# Patient Record
Sex: Female | Born: 1945
Health system: Southern US, Community
[De-identification: ages and names within clinical notes are randomized; demographics above are authoritative.]

## PROBLEM LIST (undated history)

## (undated) DIAGNOSIS — F341 Dysthymic disorder: Secondary | ICD-10-CM

## (undated) DIAGNOSIS — Z8673 Personal history of transient ischemic attack (TIA), and cerebral infarction without residual deficits: Secondary | ICD-10-CM

## (undated) DIAGNOSIS — D0511 Intraductal carcinoma in situ of right breast: Secondary | ICD-10-CM

## (undated) DIAGNOSIS — G629 Polyneuropathy, unspecified: Secondary | ICD-10-CM

## (undated) DIAGNOSIS — Z8489 Family history of other specified conditions: Secondary | ICD-10-CM

## (undated) DIAGNOSIS — E785 Hyperlipidemia, unspecified: Secondary | ICD-10-CM

## (undated) DIAGNOSIS — C50912 Malignant neoplasm of unspecified site of left female breast: Secondary | ICD-10-CM

## (undated) DIAGNOSIS — J449 Chronic obstructive pulmonary disease, unspecified: Secondary | ICD-10-CM

## (undated) HISTORY — DX: Dysthymic disorder: F34.1

## (undated) HISTORY — DX: Polyneuropathy, unspecified: G62.9

## (undated) HISTORY — DX: Morbid (severe) obesity due to excess calories: E66.01

## (undated) HISTORY — PX: VAGINAL HYSTERECTOMY: SHX2639

## (undated) HISTORY — DX: Hyperlipidemia, unspecified: E78.5

## (undated) HISTORY — DX: Chronic obstructive pulmonary disease, unspecified: J44.9

## (undated) HISTORY — PX: TONSILLECTOMY: SUR1361

---

## 1996-05-01 ENCOUNTER — Encounter: Payer: Self-pay | Admitting: Family Medicine

## 1998-12-13 DIAGNOSIS — Z8659 Personal history of other mental and behavioral disorders: Secondary | ICD-10-CM | POA: Insufficient documentation

## 1998-12-17 ENCOUNTER — Other Ambulatory Visit: Admission: RE | Admit: 1998-12-17 | Discharge: 1998-12-17 | Payer: Self-pay | Admitting: Gynecology

## 1999-03-11 ENCOUNTER — Encounter (HOSPITAL_COMMUNITY): Admission: RE | Admit: 1999-03-11 | Discharge: 1999-05-03 | Payer: Self-pay | Admitting: Psychiatry

## 2000-06-22 ENCOUNTER — Encounter (INDEPENDENT_AMBULATORY_CARE_PROVIDER_SITE_OTHER): Payer: Self-pay | Admitting: Specialist

## 2000-06-22 ENCOUNTER — Other Ambulatory Visit: Admission: RE | Admit: 2000-06-22 | Discharge: 2000-06-22 | Payer: Self-pay | Admitting: Gynecology

## 2001-11-28 ENCOUNTER — Encounter: Payer: Self-pay | Admitting: *Deleted

## 2001-11-28 ENCOUNTER — Encounter: Admission: RE | Admit: 2001-11-28 | Discharge: 2001-11-28 | Payer: Self-pay | Admitting: *Deleted

## 2001-12-03 ENCOUNTER — Encounter: Payer: Self-pay | Admitting: *Deleted

## 2001-12-03 ENCOUNTER — Encounter: Admission: RE | Admit: 2001-12-03 | Discharge: 2001-12-03 | Payer: Self-pay | Admitting: *Deleted

## 2001-12-10 ENCOUNTER — Encounter: Admission: RE | Admit: 2001-12-10 | Discharge: 2001-12-10 | Payer: Self-pay | Admitting: *Deleted

## 2001-12-10 ENCOUNTER — Encounter: Payer: Self-pay | Admitting: *Deleted

## 2001-12-14 ENCOUNTER — Encounter: Payer: Self-pay | Admitting: *Deleted

## 2001-12-14 ENCOUNTER — Ambulatory Visit (HOSPITAL_COMMUNITY): Admission: RE | Admit: 2001-12-14 | Discharge: 2001-12-14 | Payer: Self-pay | Admitting: *Deleted

## 2001-12-17 ENCOUNTER — Ambulatory Visit (HOSPITAL_COMMUNITY): Admission: RE | Admit: 2001-12-17 | Discharge: 2001-12-17 | Payer: Self-pay | Admitting: *Deleted

## 2001-12-26 ENCOUNTER — Ambulatory Visit (HOSPITAL_COMMUNITY): Admission: RE | Admit: 2001-12-26 | Discharge: 2001-12-26 | Payer: Self-pay | Admitting: *Deleted

## 2001-12-26 HISTORY — PX: CARDIAC CATHETERIZATION: SHX172

## 2003-02-16 ENCOUNTER — Ambulatory Visit (HOSPITAL_COMMUNITY): Admission: RE | Admit: 2003-02-16 | Discharge: 2003-02-16 | Payer: Self-pay | Admitting: *Deleted

## 2003-12-08 ENCOUNTER — Encounter: Admission: RE | Admit: 2003-12-08 | Discharge: 2004-03-07 | Payer: Self-pay | Admitting: Neurology

## 2004-07-14 ENCOUNTER — Other Ambulatory Visit: Admission: RE | Admit: 2004-07-14 | Discharge: 2004-07-14 | Payer: Self-pay | Admitting: Gynecology

## 2004-10-06 ENCOUNTER — Ambulatory Visit (HOSPITAL_COMMUNITY): Admission: RE | Admit: 2004-10-06 | Discharge: 2004-10-06 | Payer: Self-pay | Admitting: Internal Medicine

## 2005-02-23 HISTORY — PX: ESOPHAGOGASTRODUODENOSCOPY: SHX1529

## 2005-06-25 ENCOUNTER — Encounter: Payer: Self-pay | Admitting: Family Medicine

## 2005-06-25 LAB — CONVERTED CEMR LAB: Pap Smear: NORMAL

## 2005-07-18 ENCOUNTER — Other Ambulatory Visit: Admission: RE | Admit: 2005-07-18 | Discharge: 2005-07-18 | Payer: Self-pay | Admitting: Gynecology

## 2006-04-18 ENCOUNTER — Ambulatory Visit: Payer: Self-pay | Admitting: Family Medicine

## 2006-05-09 ENCOUNTER — Ambulatory Visit: Payer: Self-pay | Admitting: Family Medicine

## 2006-05-11 ENCOUNTER — Ambulatory Visit: Payer: Self-pay | Admitting: Internal Medicine

## 2006-06-20 ENCOUNTER — Ambulatory Visit: Payer: Self-pay | Admitting: Family Medicine

## 2006-08-01 ENCOUNTER — Other Ambulatory Visit: Admission: RE | Admit: 2006-08-01 | Discharge: 2006-08-01 | Payer: Self-pay | Admitting: Gynecology

## 2006-08-22 ENCOUNTER — Ambulatory Visit: Payer: Self-pay | Admitting: Family Medicine

## 2006-12-13 ENCOUNTER — Encounter: Payer: Self-pay | Admitting: Family Medicine

## 2006-12-13 DIAGNOSIS — J449 Chronic obstructive pulmonary disease, unspecified: Secondary | ICD-10-CM | POA: Insufficient documentation

## 2006-12-13 DIAGNOSIS — IMO0002 Reserved for concepts with insufficient information to code with codable children: Secondary | ICD-10-CM | POA: Insufficient documentation

## 2006-12-13 DIAGNOSIS — K5730 Diverticulosis of large intestine without perforation or abscess without bleeding: Secondary | ICD-10-CM | POA: Insufficient documentation

## 2006-12-13 DIAGNOSIS — Z8679 Personal history of other diseases of the circulatory system: Secondary | ICD-10-CM | POA: Insufficient documentation

## 2006-12-13 DIAGNOSIS — F1011 Alcohol abuse, in remission: Secondary | ICD-10-CM | POA: Insufficient documentation

## 2006-12-13 DIAGNOSIS — I10 Essential (primary) hypertension: Secondary | ICD-10-CM | POA: Insufficient documentation

## 2006-12-13 DIAGNOSIS — G609 Hereditary and idiopathic neuropathy, unspecified: Secondary | ICD-10-CM | POA: Insufficient documentation

## 2006-12-13 HISTORY — DX: Morbid (severe) obesity due to excess calories: E66.01

## 2007-02-14 ENCOUNTER — Ambulatory Visit: Payer: Self-pay | Admitting: Internal Medicine

## 2007-02-21 ENCOUNTER — Telehealth (INDEPENDENT_AMBULATORY_CARE_PROVIDER_SITE_OTHER): Payer: Self-pay | Admitting: *Deleted

## 2007-02-22 ENCOUNTER — Ambulatory Visit: Payer: Self-pay | Admitting: Family Medicine

## 2007-08-13 ENCOUNTER — Telehealth: Payer: Self-pay | Admitting: Family Medicine

## 2007-08-21 ENCOUNTER — Other Ambulatory Visit: Admission: RE | Admit: 2007-08-21 | Discharge: 2007-08-21 | Payer: Self-pay | Admitting: Gynecology

## 2007-10-07 ENCOUNTER — Emergency Department (HOSPITAL_COMMUNITY): Admission: EM | Admit: 2007-10-07 | Discharge: 2007-10-07 | Payer: Self-pay | Admitting: Family Medicine

## 2007-10-10 ENCOUNTER — Encounter (INDEPENDENT_AMBULATORY_CARE_PROVIDER_SITE_OTHER): Payer: Self-pay | Admitting: *Deleted

## 2007-10-10 ENCOUNTER — Ambulatory Visit: Payer: Self-pay | Admitting: Family Medicine

## 2007-10-10 ENCOUNTER — Telehealth: Payer: Self-pay | Admitting: Family Medicine

## 2007-10-17 DIAGNOSIS — R7309 Other abnormal glucose: Secondary | ICD-10-CM | POA: Insufficient documentation

## 2007-10-17 LAB — CONVERTED CEMR LAB
ALT: 38 units/L — ABNORMAL HIGH (ref 0–35)
Albumin: 3.4 g/dL — ABNORMAL LOW (ref 3.5–5.2)
Alkaline Phosphatase: 66 units/L (ref 39–117)
BUN: 9 mg/dL (ref 6–23)
CO2: 25 meq/L (ref 19–32)
Calcium: 8.7 mg/dL (ref 8.4–10.5)
Creatinine, Ser: 0.7 mg/dL (ref 0.4–1.2)
GFR calc Af Amer: 109 mL/min
Total CHOL/HDL Ratio: 3.8
Total Protein: 6.8 g/dL (ref 6.0–8.3)
VLDL: 27 mg/dL (ref 0–40)

## 2007-10-28 ENCOUNTER — Telehealth: Payer: Self-pay | Admitting: Family Medicine

## 2007-11-14 ENCOUNTER — Encounter: Payer: Self-pay | Admitting: Family Medicine

## 2007-12-18 ENCOUNTER — Encounter: Payer: Self-pay | Admitting: Family Medicine

## 2008-01-09 ENCOUNTER — Emergency Department (HOSPITAL_COMMUNITY): Admission: EM | Admit: 2008-01-09 | Discharge: 2008-01-09 | Payer: Self-pay | Admitting: Emergency Medicine

## 2008-01-13 ENCOUNTER — Encounter: Payer: Self-pay | Admitting: Family Medicine

## 2008-01-16 ENCOUNTER — Encounter: Payer: Self-pay | Admitting: Family Medicine

## 2008-01-21 ENCOUNTER — Ambulatory Visit: Payer: Self-pay | Admitting: Family Medicine

## 2008-02-03 ENCOUNTER — Ambulatory Visit: Payer: Self-pay | Admitting: Pulmonary Disease

## 2008-02-04 ENCOUNTER — Ambulatory Visit: Payer: Self-pay | Admitting: Pulmonary Disease

## 2008-02-11 ENCOUNTER — Telehealth (INDEPENDENT_AMBULATORY_CARE_PROVIDER_SITE_OTHER): Payer: Self-pay | Admitting: *Deleted

## 2008-02-12 ENCOUNTER — Ambulatory Visit: Payer: Self-pay | Admitting: Pulmonary Disease

## 2008-02-18 ENCOUNTER — Encounter: Payer: Self-pay | Admitting: Family Medicine

## 2008-03-25 ENCOUNTER — Encounter: Payer: Self-pay | Admitting: Family Medicine

## 2008-04-17 ENCOUNTER — Ambulatory Visit: Payer: Self-pay | Admitting: Family Medicine

## 2008-04-18 ENCOUNTER — Encounter: Payer: Self-pay | Admitting: Family Medicine

## 2008-04-21 ENCOUNTER — Ambulatory Visit: Payer: Self-pay | Admitting: Family Medicine

## 2008-05-06 ENCOUNTER — Telehealth: Payer: Self-pay | Admitting: Family Medicine

## 2008-05-26 ENCOUNTER — Encounter: Payer: Self-pay | Admitting: Family Medicine

## 2008-06-22 ENCOUNTER — Emergency Department (HOSPITAL_COMMUNITY): Admission: EM | Admit: 2008-06-22 | Discharge: 2008-06-22 | Payer: Self-pay | Admitting: *Deleted

## 2008-07-02 ENCOUNTER — Encounter: Payer: Self-pay | Admitting: Family Medicine

## 2008-11-18 ENCOUNTER — Encounter: Payer: Self-pay | Admitting: Gynecology

## 2008-11-18 ENCOUNTER — Ambulatory Visit: Payer: Self-pay | Admitting: Gynecology

## 2008-11-18 ENCOUNTER — Other Ambulatory Visit: Admission: RE | Admit: 2008-11-18 | Discharge: 2008-11-18 | Payer: Self-pay | Admitting: Gynecology

## 2009-01-25 ENCOUNTER — Ambulatory Visit: Payer: Self-pay | Admitting: Family Medicine

## 2009-01-25 DIAGNOSIS — L509 Urticaria, unspecified: Secondary | ICD-10-CM | POA: Insufficient documentation

## 2009-05-14 ENCOUNTER — Telehealth: Payer: Self-pay | Admitting: Family Medicine

## 2009-05-18 ENCOUNTER — Encounter (INDEPENDENT_AMBULATORY_CARE_PROVIDER_SITE_OTHER): Payer: Self-pay | Admitting: *Deleted

## 2009-05-26 LAB — CONVERTED CEMR LAB

## 2009-05-31 ENCOUNTER — Emergency Department: Payer: Self-pay | Admitting: Emergency Medicine

## 2009-06-02 ENCOUNTER — Encounter: Payer: Self-pay | Admitting: Family Medicine

## 2009-06-16 ENCOUNTER — Encounter: Payer: Self-pay | Admitting: Family Medicine

## 2009-07-07 ENCOUNTER — Encounter: Payer: Self-pay | Admitting: Family Medicine

## 2009-08-02 ENCOUNTER — Ambulatory Visit: Payer: Self-pay | Admitting: Family Medicine

## 2009-08-02 DIAGNOSIS — E7800 Pure hypercholesterolemia, unspecified: Secondary | ICD-10-CM | POA: Insufficient documentation

## 2009-08-05 LAB — CONVERTED CEMR LAB
ALT: 23 units/L (ref 0–35)
BUN: 7 mg/dL (ref 6–23)
Bilirubin, Direct: 0.1 mg/dL (ref 0.0–0.3)
CO2: 27 meq/L (ref 19–32)
Chloride: 102 meq/L (ref 96–112)
Glucose, Bld: 105 mg/dL — ABNORMAL HIGH (ref 70–99)
HDL: 77.2 mg/dL (ref >39.00)
Potassium: 3.9 meq/L (ref 3.5–5.1)
Sodium: 142 meq/L (ref 135–145)
Total Bilirubin: 1.3 mg/dL — ABNORMAL HIGH (ref 0.3–1.2)
Total CHOL/HDL Ratio: 3
VLDL: 17.4 mg/dL (ref 0.0–40.0)

## 2009-08-06 ENCOUNTER — Ambulatory Visit: Payer: Self-pay | Admitting: Family Medicine

## 2009-08-06 DIAGNOSIS — R609 Edema, unspecified: Secondary | ICD-10-CM | POA: Insufficient documentation

## 2009-08-06 LAB — CONVERTED CEMR LAB
HDL goal, serum: 40 mg/dL
LDL Cholesterol: 126 mg/dL
LDL Goal: 160 mg/dL

## 2009-08-13 ENCOUNTER — Encounter: Payer: Self-pay | Admitting: Family Medicine

## 2009-08-17 ENCOUNTER — Encounter: Payer: Self-pay | Admitting: Family Medicine

## 2009-08-30 ENCOUNTER — Ambulatory Visit: Payer: Self-pay | Admitting: Family Medicine

## 2009-08-30 ENCOUNTER — Telehealth: Payer: Self-pay | Admitting: Family Medicine

## 2009-09-02 ENCOUNTER — Encounter: Payer: Self-pay | Admitting: Family Medicine

## 2009-10-07 ENCOUNTER — Encounter: Payer: Self-pay | Admitting: Family Medicine

## 2009-11-08 ENCOUNTER — Encounter (INDEPENDENT_AMBULATORY_CARE_PROVIDER_SITE_OTHER): Payer: Self-pay | Admitting: *Deleted

## 2009-11-12 ENCOUNTER — Ambulatory Visit: Payer: Self-pay | Admitting: Family Medicine

## 2009-11-24 ENCOUNTER — Encounter: Payer: Self-pay | Admitting: Family Medicine

## 2009-11-25 ENCOUNTER — Ambulatory Visit: Payer: Self-pay

## 2009-11-25 ENCOUNTER — Encounter: Payer: Self-pay | Admitting: Family Medicine

## 2009-11-30 ENCOUNTER — Encounter: Payer: Self-pay | Admitting: Family Medicine

## 2010-02-02 ENCOUNTER — Ambulatory Visit: Payer: Self-pay | Admitting: Family Medicine

## 2010-02-02 DIAGNOSIS — E876 Hypokalemia: Secondary | ICD-10-CM | POA: Insufficient documentation

## 2010-02-02 DIAGNOSIS — R5381 Other malaise: Secondary | ICD-10-CM | POA: Insufficient documentation

## 2010-02-02 DIAGNOSIS — R5383 Other fatigue: Secondary | ICD-10-CM

## 2010-04-13 ENCOUNTER — Encounter: Payer: Self-pay | Admitting: Family Medicine

## 2010-05-02 ENCOUNTER — Telehealth: Payer: Self-pay | Admitting: Family Medicine

## 2010-05-11 ENCOUNTER — Ambulatory Visit: Payer: Self-pay | Admitting: Family Medicine

## 2010-05-11 ENCOUNTER — Telehealth: Payer: Self-pay | Admitting: Family Medicine

## 2010-05-11 ENCOUNTER — Encounter (INDEPENDENT_AMBULATORY_CARE_PROVIDER_SITE_OTHER): Payer: Self-pay | Admitting: *Deleted

## 2010-05-11 DIAGNOSIS — L259 Unspecified contact dermatitis, unspecified cause: Secondary | ICD-10-CM | POA: Insufficient documentation

## 2010-05-11 LAB — CONVERTED CEMR LAB
ALT: 16 units/L
AST: 11 units/L
BUN: 13 mg/dL
Free T4: 0.8 ng/dL
GFR calc Af Amer: >60 mL/min
GFR calc non Af Amer: >60 mL/min
Glucose, Bld: 93 mg/dL
Sodium: 143 meq/L
TSH: 1.797 microintl units/mL
Total Bilirubin: 0.6 mg/dL

## 2010-06-22 ENCOUNTER — Ambulatory Visit: Payer: Self-pay | Admitting: Family Medicine

## 2010-06-22 DIAGNOSIS — S335XXA Sprain of ligaments of lumbar spine, initial encounter: Secondary | ICD-10-CM | POA: Insufficient documentation

## 2010-06-22 DIAGNOSIS — M6530 Trigger finger, unspecified finger: Secondary | ICD-10-CM | POA: Insufficient documentation

## 2010-06-22 DIAGNOSIS — E538 Deficiency of other specified B group vitamins: Secondary | ICD-10-CM | POA: Insufficient documentation

## 2010-06-22 DIAGNOSIS — F341 Dysthymic disorder: Secondary | ICD-10-CM | POA: Insufficient documentation

## 2010-07-18 ENCOUNTER — Encounter: Payer: Self-pay | Admitting: Family Medicine

## 2010-07-20 ENCOUNTER — Ambulatory Visit: Payer: Self-pay | Admitting: Family Medicine

## 2010-09-23 ENCOUNTER — Ambulatory Visit: Payer: Self-pay | Admitting: Family Medicine

## 2010-09-28 ENCOUNTER — Ambulatory Visit
Admission: RE | Admit: 2010-09-28 | Discharge: 2010-09-28 | Payer: Self-pay | Source: Home / Self Care | Attending: Family Medicine | Admitting: Family Medicine

## 2010-09-28 ENCOUNTER — Ambulatory Visit
Admission: RE | Admit: 2010-09-28 | Discharge: 2010-09-28 | Payer: Self-pay | Source: Home / Self Care | Attending: Internal Medicine | Admitting: Internal Medicine

## 2010-09-28 ENCOUNTER — Other Ambulatory Visit: Payer: Self-pay | Admitting: Family Medicine

## 2010-09-28 ENCOUNTER — Telehealth: Payer: Self-pay | Admitting: Family Medicine

## 2010-09-28 LAB — BASIC METABOLIC PANEL
BUN: 9 mg/dL (ref 6–23)
CO2: 30 mEq/L (ref 19–32)
Calcium: 9 mg/dL (ref 8.4–10.5)
Chloride: 107 mEq/L (ref 96–112)
Creatinine, Ser: 0.4 mg/dL (ref 0.4–1.2)
GFR: 192.65 mL/min (ref >60.00)
Glucose, Bld: 92 mg/dL (ref 70–99)
Potassium: 4.3 mEq/L (ref 3.5–5.1)
Sodium: 144 mEq/L (ref 135–145)

## 2010-09-29 LAB — URIC ACID: Uric Acid, Serum: 5.6 mg/dL (ref 2.4–7.0)

## 2010-10-04 ENCOUNTER — Telehealth: Payer: Self-pay | Admitting: Family Medicine

## 2010-10-04 ENCOUNTER — Ambulatory Visit: Admit: 2010-10-04 | Payer: Self-pay | Admitting: Family Medicine

## 2010-10-16 ENCOUNTER — Encounter: Payer: Self-pay | Admitting: Family Medicine

## 2010-10-23 LAB — CONVERTED CEMR LAB
BUN: 7 mg/dL (ref 6–23)
Chloride: 106 meq/L (ref 96–112)
Glucose, Bld: 88 mg/dL (ref 70–99)
HDL: 74 mg/dL (ref >39.00)
Hgb A1c MFr Bld: 5.5 % (ref 4.6–6.5)
LDL Cholesterol: 61 mg/dL (ref 0–99)
Potassium: 3.1 meq/L — ABNORMAL LOW (ref 3.5–5.1)
Sodium: 144 meq/L (ref 135–145)
VLDL: 29.8 mg/dL (ref 0.0–40.0)

## 2010-10-25 NOTE — Letter (Signed)
Summary: [facility]  [facility]   Imported By: Lanelle Bal 07/27/2010 09:27:05  _____________________________________________________________________  External Attachment:    Type:   Image     Comment:   External Document
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
## 2010-10-25 NOTE — Assessment & Plan Note (Signed)
Summary: ROA FOR 3 MONTH FOLLOW-UP/JRR   Vital Signs:  Patient profile:   65 year old female Height:      64 inches Weight:      227.38 pounds BMI:     39.17 Temp:     97.9 degrees F oral Pulse rate:   72 / minute Pulse rhythm:   regular BP sitting:   126 / 70  (left arm) Cuff size:   regular  Vitals Entered By: Linde Gillis CMA Duncan Dull) (Feb 02, 2010 8:16 AM) CC: 3 month follow up   History of Present Illness:   Peripheral edema ...has improved significantly.  No chest pain, does have some gradual SOB worsening..does have COPD baseline. Using Spiriva more regularly.  ECHO neg, TSH, cbc nml.  Not wearing compression hose consistently.    She states that with new boss..she cannot physically handle new requirements of boss. Walk more, lifting, told not to rest. Cannot do this due to chronic peripheral neuropathy issues..cannot walk without cane. Worsening symptoms....she states toady symptoms are even worse. Occ falls, losing balance.  Poor proprioception.  Some weakness in legs. Pain moderately controlled, but newer shooting pains in feet.  Some low back ache..no radicular symptoms.  Last OV with neuro 1 year ago. Could not tolerate neuronitn..feels it cause blisters on feet.  Did not tolerate lyrica. Most recently tried Cymbalta for 30 days..helped but had initial dayhallucinations resolved , but then constipation on it. Has not seen neurologist ..could not get an appt....she wishes to see different MD.    COPD, improved  control.Marland Kitchenless coughing occ, some exacrbations..using Spiriva as needed.."drys me out, but does help a lot when used"   HTN.Marland Kitchenpotassium low last lab check...stop HCTZ..  Call if swelling returns..can consider adding maxide to conserve potassium.   Allergies: 1)  ! Betadine 2)  ! Iodine  Review of Systems General:  Denies fatigue and fever. CV:  Denies chest pain or discomfort. Resp:  Denies shortness of breath, sputum productive, and wheezing. GI:   Denies abdominal pain. GU:  Denies dysuria.  Physical Exam  General:  obese female in NAD, non toxic appearing. using cance Mouth:  clear Neck:  no carotid bruit or thyromegaly  Lungs:  Normal respiratory effort, chest expands symmetrically.  Occasional expiratory wheezes, no crackles. Heart:  Normal rate and regular rhythm. S1 and S2 normal without gallop, murmur, click, rub or other extra sounds. Abdomen:  Bowel sounds positive,abdomen soft and non-tender without masses, organomegaly or hernias noted. Msk:  ttp in low back centrally, neg Faber's , neg SLR limited sid flexion in neck, no radiculopathy. Pulses:  R and L posterior tibial pulses are full and equal bilaterally  Extremities:  2+ left pedal edema and 2+ right pedal edema.   B varicosities right second digit with crack on knuckle, no surrounding erythema.  Neurologic:  no sensation in B feet, ankle salert & oriented X3, cranial nerves II-XII intact, and abnormal gait.     Impression & Recommendations:  Problem # 1:  PERIPHERAL NEUROPATHY (ICD-356.9) Likely due to hx of alcoholism.  Given back pain will eval for spinal stenosis. Multiple meds tried for symptoms in past...retry cymbalta..use miralax for consitpation.  Refer to neurologist for further recommendations. Pt considering looking into disability options.Marland Kitchenbut would liek to neurologist first.    Radiology Referral (Radiology) Neurology Referral (Neuro)  Problem # 2:  WEAKNESS (ICD-780.79) Given weakness as well in legs...eval with lumbar MRI for spinal stenosis.  Problem # 3:  HYPOKALEMIA (ICD-276.8) Mild. Due  to HCTZ.  Stop.Marland Kitchenif swelling returns or poor BP control..will add back in form of maxide. recheck potassium next OV.   Problem # 4:  PERIPHERAL EDEMA (ICD-782.3) resolved  Problem # 5:  COPD (ICD-496) Stable. Her updated medication list for this problem includes:    Spiriva Handihaler 18 Mcg Caps (Tiotropium bromide monohydrate) .Marland Kitchen... Take 1 puff  daily    Proair Hfa 108 (90 Base) Mcg/act Aers (Albuterol sulfate) .Marland Kitchen... Take 2 puffs every 4-6 hours as needed for cough or wheeze.  Problem # 6:  HYPERTENSION (ICD-401.9) Well controlle don current medication.  Her updated medication list for this problem includes:    Lisinopril 20 Mg Tabs (Lisinopril) .Marland Kitchen... Take 1 tablet by mouth once a day    Atenolol 50 Mg Tabs (Atenolol) .Marland Kitchen... Take 1 tablet by mouth once a day    Amlodipine Besylate 5 Mg Tabs (Amlodipine besylate) .Marland Kitchen... Take 1 tablet by mouth once a day  Complete Medication List: 1)  Lipitor 20 Mg Tabs (Atorvastatin calcium) .... Take 1 tablet by mouth once a day 2)  Lisinopril 20 Mg Tabs (Lisinopril) .... Take 1 tablet by mouth once a day 3)  Atenolol 50 Mg Tabs (Atenolol) .... Take 1 tablet by mouth once a day 4)  Aspir-trin 325 Mg Tbec (Aspirin) .... Daily 5)  Spiriva Handihaler 18 Mcg Caps (Tiotropium bromide monohydrate) .... Take 1 puff daily 6)  One-daily Multivitamins Tabs (Multiple vitamin) .... Daily, women's vitamin for over 68 years of age. 7)  Amlodipine Besylate 5 Mg Tabs (Amlodipine besylate) .... Take 1 tablet by mouth once a day 8)  Folgard Rx 2.2-25-1 Mg Tabs (Folic acid-vit b6-vit b12) .... Take 1 tablet by mouth once a day 9)  Cerefolin 5.635-1-50-5 Mg Tabs (L-methylfolate-b12-b6-b2) .... Take 1 tablet by mouth once a day 10)  Proair Hfa 108 (90 Base) Mcg/act Aers (Albuterol sulfate) .... Take 2 puffs every 4-6 hours as needed for cough or wheeze. 11)  Diabetic Shoes  .... Wear daily dx 365.9 peripheral neuropathy, prediabetes 12)  Cymbalta 30 Mg Cpep (Duloxetine hcl) .Marland Kitchen.. 1 tab by mouth daily at bedtime, if tolerated after 1 week, increase to 2 tabs by mouth daily  Patient Instructions: 1)  Referral Appointment Information 2)  Day/Date: 3)  Time: 4)  Place/MD: 5)  Address: 6)  Phone/Fax: 7)  Patient given appointment information. Information/Orders faxed/mailed.  8)  Please schedule a follow-up appointment  in 3 months .  9)  BMP prior to visit, ICD-9: 401.1 10)  Hepatic Panel prior to visit ICD-9:  11)  Lipid panel prior to visit ICD-9 :  12)  HgBA1c prior to visit  ICD-9:  13)  Rettry cymbalta...can use mirralax for constipation.  Prescriptions: LISINOPRIL 20 MG TABS (LISINOPRIL) Take 1 tablet by mouth once a day  #30 x 11   Entered and Authorized by:   Kerby Nora MD   Signed by:   Kerby Nora MD on 02/02/2010   Method used:   Electronically to        [facility]* (retail)       [address]       [place], [place]  [address]       Ph: [phone]       Fax: [phone]   RxID:   [id number]   Current Allergies (reviewed today): ! BETADINE ! IODINE

## 2010-10-25 NOTE — Progress Notes (Signed)
Summary: regarding triamcinolone  Phone Note From Pharmacy   Caller: [facility]* Summary of Call: [facility] asked if ok to change pt's triamcinolone cream to ointment, because the cream is on back order.  Advised ok. Initial call taken by: Lowella Petties CMA,  May 11, 2010 10:47 AM
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
## 2010-10-25 NOTE — Letter (Signed)
Summary: [place] No Show Letter  [place] at [facility]  [address] [place], [place] [address]   Phone: [phone]  Fax: [phone]    11/08/2009 MRN: [id number]  [facility] [address] [place], [place]  [address]   Dear Ms. Lapiana,   Our records indicate that you missed your scheduled appointment with __Lab___________________ on __2.14.11__________.  Please contact this office to reschedule your appointment as soon as possible.  It is important that you keep your scheduled appointments with your physician, so we can provide you the best care possible.  Please be advised that there may be a charge for "no show" appointments.    Sincerely,   [place] at [facility]

## 2010-10-25 NOTE — Miscellaneous (Signed)
Summary: echo order    Clinical Lists Changes  Orders: Added new Referral order of Echocardiogram (Echo) - Signed 

## 2010-10-25 NOTE — Consult Note (Signed)
Summary: [facility]  [facility]   Imported By: Lanelle Bal 05/04/2010 14:02:08  _____________________________________________________________________  External Attachment:    Type:   Image     Comment:   External Document
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
## 2010-10-25 NOTE — Miscellaneous (Signed)
  Clinical Lists Changes  Observations: Added new observation of TSH: 1.797 microintl units/mL (05/11/2010 8:30) Added new observation of T4, FREE: 0.80 ng/dL (09/81/1914 7:82) Added new observation of CALCIUM: 9.3 mg/dL (95/62/1308 6:57) Added new observation of ALBUMIN: 4.2 g/dL (84/69/6295 2:84) Added new observation of SGPT (ALT): 16 units/L (05/11/2010 8:30) Added new observation of SGOT (AST): 11 units/L (05/11/2010 8:30) Added new observation of ALK PHOS: 60 units/L (05/11/2010 8:30) Added new observation of BILI TOTAL: 0.6 mg/dL (13/24/4010 2:72) Added new observation of GFRAA: >60 mL/min (05/11/2010 8:30) Added new observation of GFR: >60 mL/min (05/11/2010 8:30) Added new observation of CREATININE: 0.6 mg/dL (53/66/4403 4:74) Added new observation of BUN: 13 mg/dL (25/95/6387 5:64) Added new observation of BG RANDOM: 93 mg/dL (33/29/5188 4:16) Added new observation of CL SERUM: 106 meq/L (05/11/2010 8:30) Added new observation of K SERUM: 4.4 meq/L (05/11/2010 8:30) Added new observation of NA: 143.0 meq/L (05/11/2010 8:30)

## 2010-10-25 NOTE — Progress Notes (Signed)
Summary: regarding lab work  Phone Note Call from Patient Call back at [phone]   Caller: Patient Call For: Kerby Nora MD Summary of Call: Pt has appt for lab work on 8/10 but she is asking if she needs to keep this since she recently had labs at [facility]. Initial call taken by: Lowella Petties CMA,  May 02, 2010 2:49 PM  Follow-up for Phone Call        Does not need lab appt.  Follow-up by: Kerby Nora MD,  May 03, 2010 1:23 PM  Additional Follow-up for Phone Call Additional follow up Details #1::        patient advised and appt cancelled.Consuello Masse CMA
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
## 2010-10-27 NOTE — Progress Notes (Signed)
  Phone Note Call from Patient   Caller: Patient Summary of Call: [facility] and placed referral order for HHPT. Called patient to let her know that they would be calling her and then coming out for PT at her home. She said her foot was better and said she did not want HH at all to come out. Please advise do you want to cancel this order?  Initial call taken by: Carlton Adam,  October 04, 2010 9:39 AM  Follow-up for Phone Call        ok to cancel order.  was actually for PT home safety evaluation given recent fall.  pt was interested during OV but apparently not anymore. Follow-up by: Eustaquio Boyden  MD,  October 04, 2010 10:32 AM

## 2010-10-27 NOTE — Progress Notes (Signed)
Summary: X-ray results  Phone Note Outgoing Call Call back at Home Phone [phone]   Call placed by: Eustaquio Boyden  MD  September 28, 2010 1:39 PM  Call placed to: Patient Summary of Call: attempted to call patient at [phone].  no answer.  please call and inform xray showing old fracture of 5th metatarsal of foot.  we will call her with results of lab work.  take vicodin, elevate leg, and rest until she hears from [place].  Selena Batten gone for day)   Initial call taken by: Eustaquio Boyden  MD  September 28, 2010 1:39 PM   Follow-up for Phone Call        voicemail not set up at home answering service, will try again later. DeShannon Smith CMA [facility])  September 28, 2010 3:50 PM   tried calling work number [phone], pt no longer works there, called cell number spoke with patient and she will wait to here back about the lab results, she did ask about the other side of her foot, the side that's hurting, was that ok?  DeShannon Katrinka Blazing CMA [facility]ull)  September 28, 2010 3:54 PM   Additional Follow-up for Phone Call Additional follow up Details #1::        called and spoke with patient, discussed results of blood work.  normal uric acid, pointing against gout (although not definitive).  advised to hold off on colchicine for now.  given degree of erythema, ? infection.  start bactrim twice daily for 10 days to treat possible cellulitis component.  foot hurting, hasn't tried mobic.  advised to try mobic, use vicodin for breakthrough.    can we call to set up appt next week for f/u? thanks. Additional Follow-up by: Eustaquio Boyden  MD,  September 29, 2010 5:34 PM    Additional Follow-up for Phone Call Additional follow up Details #2::    Spoke with patient and appt scheduled for next week. Follow-up by: Janee Morn CMA (AAMA),  September 30, 2010 9:01 AM  New/Updated Medications: BACTRIM DS 800-160 MG TABS (SULFAMETHOXAZOLE-TRIMETHOPRIM) take one by mouth two times a day x 10 days Prescriptions: BACTRIM DS  800-160 MG TABS (SULFAMETHOXAZOLE-TRIMETHOPRIM) take one by mouth two times a day x 10 days  #20 x 0   Entered and Authorized by:   Eustaquio Boyden  MD   Signed by:   Eustaquio Boyden  MD on 09/29/2010   Method used:   Electronically to        [facility]* (retail)       [address]       [place], [place]  [address]       Ph: [phone]       Fax: [phone]   RxID:   [id number]
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
## 2010-11-09 ENCOUNTER — Ambulatory Visit: Payer: BC Managed Care – PPO | Admitting: Family Medicine

## 2010-11-15 ENCOUNTER — Ambulatory Visit (INDEPENDENT_AMBULATORY_CARE_PROVIDER_SITE_OTHER): Payer: BC Managed Care – PPO | Admitting: Family Medicine

## 2010-11-15 ENCOUNTER — Encounter: Payer: Self-pay | Admitting: Family Medicine

## 2010-11-15 DIAGNOSIS — G609 Hereditary and idiopathic neuropathy, unspecified: Secondary | ICD-10-CM

## 2010-11-15 DIAGNOSIS — R5381 Other malaise: Secondary | ICD-10-CM

## 2010-11-15 DIAGNOSIS — R5383 Other fatigue: Secondary | ICD-10-CM

## 2010-11-15 DIAGNOSIS — F341 Dysthymic disorder: Secondary | ICD-10-CM

## 2010-11-15 DIAGNOSIS — I10 Essential (primary) hypertension: Secondary | ICD-10-CM

## 2010-11-22 NOTE — Assessment & Plan Note (Signed)
Summary: FILL OUT FMLA FORM/CLE   [employer]BS   Vital Signs:  Patient profile:   65 year old female Height:      64 inches Weight:      233.50 pounds BMI:     40.23 Temp:     98.0 degrees F oral Pulse rate:   88 / minute Pulse rhythm:   regular BP sitting:   130 / 90  (left arm) Cuff size:   large  Vitals Entered By: Benny Lennert CMA Duncan Dull) (November 15, 2010 10:41 AM)  History of Present Illness: Chief complaint fillout FMLA paperwork   Depression,on venlafaxine...improved significantly since she has not been around stress at work...cannot afford higher dose.  SE from Cymbalta, dizzy with celexa. No SI. Ni HI.     Peripheral neuropathy worsening..Had appt with neurologist... Dr. Clelia Croft. Recommended increasing lyrica to 75 two times a day. Did not tolerate the higher dose and it did not help much so she went back to 1 tab po daily of lyrica. He stated to pt she would be a likely candidate for disability. Plans on applying for disabilty. Neurontin cause hallucinations Cannot afford lidoderm patches.  She has applied for disability but has not heard back.  Overall she cannot stand for more than several minutes, cannot walk ore than a few feet without sitting down given pain. She has poor balance and frequent falls due to not knowing where her feet are.  Most recent fall was 09/28/2010.. foot injury seen by Dr. Patrice Paradise.   Needs FMLA paperwork recompleted.   HTN, not checking at home. But does have cuff at home. On amlodipine, lisinopril, atenoolol.   She is interested in consolidationg her medicines.   Hypertension History:      Positive major cardiovascular risk factors include female age 60 years old or older, hyperlipidemia, and hypertension.  Negative major cardiovascular risk factors include non-tobacco-user status.     Allergies: 1)  ! Betadine 2)  ! Iodine  Review of Systems General:  Complains of fatigue; denies fever. CV:  Denies chest pain or discomfort. Resp:   Denies shortness of breath. GI:  Denies abdominal pain. GU:  Denies dysuria.  Physical Exam  General:  overweight female in NAD Mouth:  MMM Neck:  no carotid bruit or thyromegaly  no cervical or supraclavicular lymphadenopathy  Lungs:  Normal respiratory effort, chest expands symmetrically. Lungs are clear to auscultation, no crackles or wheezes. Heart:  Normal rate and regular rhythm. S1 and S2 normal without gallop, murmur, click, rub or other extra sounds. Msk:  Mild right dorsal swelling remains, no ain over area of recent injusry to foot Pulses:  R and L posterior tibial/dorsalis pedis pulses are full and equal bilaterally  Extremities:  1+ left pedal edema and 1+ right pedal edema.   Neurologic:  No sensation to touch in B feet/ankles  strength normal in all extremities.   Skin:  Intact without suspicious lesions or rashes   Impression & Recommendations:  Problem # 1:  PERIPHERAL NEUROPATHY (ICD-356.9) No improvement on higher dose of Lyrica. Back on lower dose.   She continues to have severe leg pain and poor balance and frequent falls due to peripheral neuropathy.  Unable to return to work.  FMLA paperwork completed.  Pt has applied for disability.. but has not heard back yet.   Problem # 2:  WEAKNESS (ICD-780.79)  Problem # 3:  HYPERTENSION (ICD-401.9) Borderline control.. follow at home.. call with measurements in 1 week. If remains high consistently.. Will  likly need to increase linsinopril. If pt wishes to consolidate meds we could consider higher dose linsinopril and stop amlodipine.  Her updated medication list for this problem includes:    Lisinopril 20 Mg Tabs (Lisinopril) .Marland Kitchen... Take 1 tablet by mouth once a day    Atenolol 50 Mg Tabs (Atenolol) .Marland Kitchen... Take 1 tablet by mouth once a day    Amlodipine Besylate 5 Mg Tabs (Amlodipine besylate) .Marland Kitchen... Take 1 tablet by mouth once a day  Problem # 4:  DEPRESSION/ANXIETY (ICD-300.4) Stable control on venlafaxine. Mood ahs  improved with decreased stress away fro work but she continues to ahve flare ups.   Complete Medication List: 1)  Lipitor 20 Mg Tabs (Atorvastatin calcium) .... Take 1 tablet by mouth once a day 2)  Lisinopril 20 Mg Tabs (Lisinopril) .... Take 1 tablet by mouth once a day 3)  Atenolol 50 Mg Tabs (Atenolol) .... Take 1 tablet by mouth once a day 4)  Aspir-trin 325 Mg Tbec (Aspirin) .... Daily 5)  Spiriva Handihaler 18 Mcg Caps (Tiotropium bromide monohydrate) .... Take 1 puff daily 6)  One-daily Multivitamins Tabs (Multiple vitamin) .... Daily, women's vitamin for over 67 years of age. 7)  Amlodipine Besylate 5 Mg Tabs (Amlodipine besylate) .... Take 1 tablet by mouth once a day 8)  Folgard Rx 2.2-25-1 Mg Tabs (Folic acid-vit b6-vit b12) .... Take 1 tablet by mouth once a day 9)  Cerefolin 5.635-1-50-5 Mg Tabs (L-methylfolate-b12-b6-b2) .... Take 1 tablet by mouth once a day 10)  Proair Hfa 108 (90 Base) Mcg/act Aers (Albuterol sulfate) .... Take 2 puffs every 4-6 hours as needed for cough or wheeze. 11)  Diabetic Shoes  .... Wear daily dx 365.9 peripheral neuropathy, prediabetes 12)  Meloxicam 15 Mg Tabs (Meloxicam) .Marland Kitchen.. 1 tab by mouth daily 13)  Venlafaxine Hcl 37.5 Mg Xr24h-cap (Venlafaxine hcl) .Marland Kitchen.. 1 tab by mouth daily x 1 week, then increase to 2 tab by mouth daily 14)  Lyrica 75 Mg Caps (Pregabalin) .... Take one tablet by mouth two times a day 15)  Colcrys 0.6 Mg Tabs (Colchicine) .... Take two then one daily as needed pain 16)  Vicodin 5-500 Mg Tabs (Hydrocodone-acetaminophen) .... Take one tid as needed pain  Hypertension Assessment/Plan:      The patient's hypertensive risk group is category B: At least one risk factor (excluding diabetes) with no target organ damage.  Her calculated 10 year risk of coronary heart disease is 7 %.  Today's blood pressure is 130/90.  Her blood pressure goal is < 140/90.  Patient Instructions: 1)  Check BP at home.Marland Kitchen goal <140/90. Call with measurments  after 1-2 weeks.  2)  When near being out of BP meds... we can change to lisinopril at 40 mg and stop amlodipine.  3)   Make sure you call GYN about the complete physical exama nd mammogram.  4)  Please schedule a follow-up appointment in 3 months 30 min. 5)  BMP prior to visit, ICD-9: 272.0 6)  Hepatic Panel prior to visit ICD-9:  7)  Lipid panel prior to visit ICD-9 :    Orders Added: 1)  Est. Patient Level IV [16109]    Current Allergies (reviewed today): ! BETADINE ! IODINE

## 2010-12-07 ENCOUNTER — Encounter: Payer: Self-pay | Admitting: Family Medicine

## 2011-02-10 NOTE — Assessment & Plan Note (Signed)
[facility]                             [facility] OFFICE NOTE   NAME:Masci, MARILYN                    MRN:          [id number]  DATE:05/09/2006                            DOB:          11/23/1945    CHIEF COMPLAINT:  A 3-week followup.   HISTORY OF PRESENT ILLNESS:  A 65 year old white female here to discuss the  following:  1. Tobacco abuse, improving.  She states that she Chantix is helping      significantly.  She is only having mild side effects such as mild      headache.  She states that the Chantix makes smoking less appetizing,      and she has cut down to only 1-2 cigarettes daily.  2. COPD, stable.  She has not yet started Spiriva because she is finishing      up her Atrovent.  She will begin using this soon.  She states that      there has been no change in her lung function.  She has no change in      her shortness of breath or chronic cough.  3. Peripheral neuropathy.  Ms. Mowbray states that she continues to have      problems with numbness on the bottom of her feet.  She states that it      is like Novocain on the bottom of her feet all the time.  They ache.      She states that she has kind of graduated worsening numbness from about      mid-calf down to her toes.  She is very sensitive to light touch, such      as the bedcovers, on her feet.  She also feels that she has decreased      sensitivity to temperature in her feet.  She does report alcohol abuse      at about 8 ounces of liquor daily.   PAST MEDICAL HISTORY:  Reviewed and unchanged.   PHYSICAL EXAMINATION:  VITAL SIGNS:  Stable.  GENERAL:  Overweight-appearing female, with spider telangiectasias on  cheeks, in no apparent distress.  NEUROLOGIC:  Cranial nerves II-XII grossly intact.  Decreased sensation to  light touch in stocking distribution on bilateral feet, decreased  sensitivity to cold and hot on soles of feet.  Monofilament testing was  within normal  limits, though, bilaterally.  Reflexes 2+.  MUSCULOSKELETAL:  Strength 5/5 in upper and lower extremities.  BACK:  Nontender to palpation.  SKIN:  No rash on her legs.   ASSESSMENT AND PLAN:  1. Tobacco abuse, improving.  She will continue using Chantix, and I      encouraged her to quit smoking entirely.  2. Chronic obstructive pulmonary disease, stable.  Will start Spiriva when      Atrovent runs out.  3. Peripheral neuropathy.  This is most likely secondary to her alcohol      use.  To evaluate other etiologies, I will obtain a thiamine, folic      acid, CBC, CMET, lipid panel, sed rate, and magnesium.   We briefly discussed decreasing the  amount of alcohol, and she may need  outpatient counseling for this.  She was instructed to not stop her alcohol  use suddenly to avoid DTs.  She will follow up after the labs are obtained,  and I will call her for an appointment.                                   Kerby Nora, MD   AB/MedQ  DD:  05/09/2006  DT:  05/09/2006  Job #:  [id number]
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
## 2011-02-10 NOTE — Cardiovascular Report (Signed)
[facility]  Patient:    [name], Melanie Schneider Visit Number: [id number] MRN: [id number]          Service Type: CAT Location: [facility] 2852 [id number] Attending Physician:  Mora Appl Dictated by:   Lemont Fillers Fraser Din, M.D. Proc. Date: 12/26/01 Admit Date:  12/26/2001 Discharge Date: 12/26/2001                          Cardiac Catheterization  REFERRING PHYSICIAN: Dianna Limbo, M.D.  INDICATIONS FOR PROCEDURE: Chest pain with reproducible hypokinesis on a stress echocardiogram.  DESCRIPTION OF PROCEDURE: After obtaining written informed consent, the patient was brought to the cardiac catheterization lab in the postabsorptive state.  Preoperative sedation was achieved with IV Versed.  The patient was premedication with Solu-Medrol 125 mg IV secondary to an iodine allergy. The right groin was prepped and draped in the usual sterile fashion. Local anesthesia was achieved using 1% Xylocaine.  The 6 French hemostasis sheath was placed into the right femoral artery using a modified Seldinger technique. Selective coronary angiography was performed using a JL4, JR4 Judkins catheter. Multiple views were obtained. All catheter exchange were made over a guide wire. Single plane ventriculogram was performed in the RAO position using a 6 French pigtail curved catheter. The hemostasis sheath was flushed following each engagement. There was no identifiable disease. The patient transferred to the holding area. The hemostasis sheath was removed. Hemostasis was achieved using digital pressure.  FINDINGS: The AO pressure is 153/84, LV pressure is 153/20. There was no gradient noted on pullback. Single plane ventriculogram revealed normal wall motion, ejection fraction of 65%. There was no mitral regurgitation noted.  Left main coronary artery: Left main coronary artery bifurcates into the left anterior descending and circumflex vessel. There is no disease in the  left main coronary artery.  Left anterior descending: The left anterior descending gives rise to a moderate diagonal #1 and a large diagonal #2. It then goes on to end as an apical recurrent branch. There is no disease in the left anterior descending nor its branches.  Circumflex vessel: The circumflex vessel gives rise to a large OM-1, moderate OM-2 and then gives rise to a small AV groove vessel. There is no significant disease in the circumflex or its branches.  Right coronary artery: The right coronary artery is a large large dominant vessel. It gives rise to three small RV marginals, large PDA and large PL branch. There is no disease in the right coronary artery or its branches.  IMPRESSION: 1. Normal coronary angiography. 2. Normal ventriculogram.  RECOMMENDATIONS: Other etiologies for her chest pain will be considered. Dictated by:   Lemont Fillers Fraser Din, M.D. Attending Physician:  Meade Maw A DD:  12/26/01 TD:  12/26/01 Job: [id number]

## 2011-04-07 ENCOUNTER — Encounter: Payer: Self-pay | Admitting: Cardiovascular Disease

## 2012-02-29 ENCOUNTER — Encounter: Payer: Self-pay | Admitting: Family Medicine

## 2012-03-01 ENCOUNTER — Encounter: Payer: Self-pay | Admitting: *Deleted

## 2012-04-08 ENCOUNTER — Other Ambulatory Visit: Payer: Self-pay

## 2012-04-08 MED ORDER — ALBUTEROL SULFATE HFA 108 (90 BASE) MCG/ACT IN AERS: 2.0000 | INHALATION_SPRAY | RESPIRATORY_TRACT | Status: DC | PRN

## 2012-04-08 NOTE — Telephone Encounter (Signed)
Pt  requesting refill Proair; pt last seen 11/15/10 in centricity; non productive cough,wheezing. No SOB or fever. Pt cannot come in today but scheduled appt Dr Ermalene Searing 04/09/12 at 9am. Advised pt if condition changes or worsens to call back or go to UC/[facility] if needed.[place].Please advise.

## 2012-04-09 ENCOUNTER — Ambulatory Visit (INDEPENDENT_AMBULATORY_CARE_PROVIDER_SITE_OTHER): Payer: BC Managed Care – PPO | Admitting: Family Medicine

## 2012-04-09 ENCOUNTER — Encounter: Payer: Self-pay | Admitting: Family Medicine

## 2012-04-09 VITALS — BP 140/80 | HR 77 | Temp 98.0°F | Ht 64.0 in | Wt 239.5 lb

## 2012-04-09 DIAGNOSIS — G609 Hereditary and idiopathic neuropathy, unspecified: Secondary | ICD-10-CM

## 2012-04-09 DIAGNOSIS — E538 Deficiency of other specified B group vitamins: Secondary | ICD-10-CM

## 2012-04-09 DIAGNOSIS — J441 Chronic obstructive pulmonary disease with (acute) exacerbation: Secondary | ICD-10-CM | POA: Insufficient documentation

## 2012-04-09 DIAGNOSIS — J449 Chronic obstructive pulmonary disease, unspecified: Secondary | ICD-10-CM

## 2012-04-09 DIAGNOSIS — I10 Essential (primary) hypertension: Secondary | ICD-10-CM

## 2012-04-09 LAB — COMPREHENSIVE METABOLIC PANEL
AST: 27 U/L (ref 0–37)
Albumin: 3.7 g/dL (ref 3.5–5.2)
Alkaline Phosphatase: 58 U/L (ref 39–117)
BUN: 6 mg/dL (ref 6–23)
Calcium: 8.2 mg/dL — ABNORMAL LOW (ref 8.4–10.5)
Chloride: 103 mEq/L (ref 96–112)
Potassium: 3.2 mEq/L — ABNORMAL LOW (ref 3.5–5.1)
Sodium: 143 mEq/L (ref 135–145)
Total Protein: 7.1 g/dL (ref 6.0–8.3)

## 2012-04-09 LAB — LIPID PANEL: Total CHOL/HDL Ratio: 3

## 2012-04-09 MED ORDER — PREDNISONE 20 MG PO TABS: ORAL_TABLET | ORAL | Status: DC

## 2012-04-09 MED ORDER — AZITHROMYCIN 250 MG PO TABS: ORAL_TABLET | ORAL | Status: AC

## 2012-04-09 NOTE — Patient Instructions (Addendum)
Restart atenol for BPO control. Continue amlodipine. Can try a trial back of either cymbalta or lyrica for peripheral neuropathy. We will discuss all other meds at next appt after labs done. For COPD exacerbation: start prednisone taper and antibiotics. Call if not improving in next few days.

## 2012-04-09 NOTE — Assessment & Plan Note (Signed)
Has stopped both cymbalta and lyrica.. Will try trial back on each one at a time to see IF SE or benefit.

## 2012-04-09 NOTE — Assessment & Plan Note (Signed)
Treat with steroids, antibiotics. Call if not improving in net few days.  Hld spiriva for now. Albuterol prn wheeze.

## 2012-04-09 NOTE — Progress Notes (Signed)
  Subjective:    Patient ID: Melanie Schneider, female    DOB: 08/06/1946, 66 y.o.   MRN: [id number]  HPI  66 year old female with COPD presents with new onset  2 days history of wheeze, cough, short of breath. Dry cough. Cough keeping her up at night.  Using albuterol as needed daily... Helps some with wheeze. ? noted symptoms following spiriva... She had not had  In a while. It is expensive.  Fever 100.9. No chest pain.  No ear pain, no sinus pain. No sore throat.   She has not been seen in over a year. She has stopped most her nmed. I want to start over again to see what I need. Continues with peripheral neuropathy pain.  HTN.. Not checking at home. Here poor control.     Review of Systems  Constitutional: Negative for fever and fatigue.  HENT: Negative for ear pain.   Eyes: Negative for pain.  Respiratory: Positive for cough, chest tightness, shortness of breath and wheezing.   Cardiovascular: Negative for chest pain, palpitations and leg swelling.  Gastrointestinal: Negative for abdominal pain.  Genitourinary: Negative for dysuria.       Objective:   Physical Exam  Constitutional: Vital signs are normal. She appears well-developed and well-nourished. She is cooperative.  Non-toxic appearance. She does not appear ill. No distress.  HENT:  Head: Normocephalic.  Right Ear: Hearing, tympanic membrane, external ear and ear canal normal. Tympanic membrane is not erythematous, not retracted and not bulging.  Left Ear: Hearing, tympanic membrane, external ear and ear canal normal. Tympanic membrane is not erythematous, not retracted and not bulging.  Nose: Mucosal edema and rhinorrhea present. Right sinus exhibits no maxillary sinus tenderness and no frontal sinus tenderness. Left sinus exhibits no maxillary sinus tenderness and no frontal sinus tenderness.  Mouth/Throat: Uvula is midline, oropharynx is clear and moist and mucous membranes are normal.  Eyes: Conjunctivae, EOM  and lids are normal. Pupils are equal, round, and reactive to light. No foreign bodies found.  Neck: Trachea normal and normal range of motion. Neck supple. Carotid bruit is not present. No mass and no thyromegaly present.  Cardiovascular: Normal rate, regular rhythm, S1 normal, S2 normal, normal heart sounds, intact distal pulses and normal pulses.  Exam reveals no gallop and no friction rub.   No murmur heard. Pulmonary/Chest: Effort normal. Not tachypneic. No respiratory distress. She has no decreased breath sounds. She has wheezes in the right upper field, the right middle field, the right lower field, the left upper field, the left middle field and the left lower field. She has no rhonchi. She has no rales.  Abdominal: Soft. Normal appearance and bowel sounds are normal. There is no tenderness.  Neurological: She is alert.  Skin: Skin is warm, dry and intact. No rash noted.  Psychiatric: Her speech is normal and behavior is normal. Judgment and thought content normal. Her mood appears not anxious. Cognition and memory are normal. She does not exhibit a depressed mood.          Assessment & Plan:

## 2012-04-09 NOTE — Assessment & Plan Note (Addendum)
Poor control off atenolol and on amlodipine alone. Restart atenolol.

## 2012-04-10 LAB — VITAMIN B12: Vitamin B-12: 198 pg/mL — ABNORMAL LOW (ref 211–911)

## 2012-04-10 LAB — LDL CHOLESTEROL, DIRECT: Direct LDL: 125.2 mg/dL

## 2012-04-11 ENCOUNTER — Encounter: Payer: Self-pay | Admitting: *Deleted

## 2012-05-09 ENCOUNTER — Ambulatory Visit (INDEPENDENT_AMBULATORY_CARE_PROVIDER_SITE_OTHER): Payer: Medicare Other | Admitting: Family Medicine

## 2012-05-09 ENCOUNTER — Encounter: Payer: Self-pay | Admitting: Family Medicine

## 2012-05-09 VITALS — BP 140/88 | HR 94 | Temp 98.4°F | Ht 64.0 in | Wt 242.5 lb

## 2012-05-09 DIAGNOSIS — E7800 Pure hypercholesterolemia, unspecified: Secondary | ICD-10-CM

## 2012-05-09 DIAGNOSIS — E538 Deficiency of other specified B group vitamins: Secondary | ICD-10-CM

## 2012-05-09 DIAGNOSIS — I10 Essential (primary) hypertension: Secondary | ICD-10-CM

## 2012-05-09 DIAGNOSIS — K5900 Constipation, unspecified: Secondary | ICD-10-CM

## 2012-05-09 DIAGNOSIS — Z0000 Encounter for general adult medical examination without abnormal findings: Secondary | ICD-10-CM

## 2012-05-09 DIAGNOSIS — E876 Hypokalemia: Secondary | ICD-10-CM

## 2012-05-09 DIAGNOSIS — J449 Chronic obstructive pulmonary disease, unspecified: Secondary | ICD-10-CM

## 2012-05-09 LAB — COMPREHENSIVE METABOLIC PANEL
Albumin: 3.6 g/dL (ref 3.5–5.2)
Alkaline Phosphatase: 51 U/L (ref 39–117)
CO2: 28 mEq/L (ref 19–32)
GFR: 115.12 mL/min (ref >60.00)
Glucose, Bld: 94 mg/dL (ref 70–99)
Potassium: 4 mEq/L (ref 3.5–5.1)
Sodium: 142 mEq/L (ref 135–145)
Total Protein: 6.7 g/dL (ref 6.0–8.3)

## 2012-05-09 LAB — VITAMIN B12: Vitamin B-12: 509 pg/mL (ref 211–911)

## 2012-05-09 NOTE — Assessment & Plan Note (Signed)
At goal of medication.

## 2012-05-09 NOTE — Assessment & Plan Note (Signed)
Increase water and fiber in diet. Increase exercise.

## 2012-05-09 NOTE — Assessment & Plan Note (Signed)
No clear cause except alcohol use...she has stopped this and is taking supplement. Recheck.

## 2012-05-09 NOTE — Assessment & Plan Note (Signed)
Return for spirometry and further eval.

## 2012-05-09 NOTE — Progress Notes (Signed)
Subjective:    Patient ID: Melanie Schneider, female    DOB: Aug 13, 1946, 66 y.o.   MRN: [id number]  HPI  I have personally reviewed the Medicare Annual Wellness questionnaire and have noted 1. The patient's medical and social history 2. Their use of alcohol, tobacco or illicit drugs 3. Their current medications and supplements 4. The patient's functional ability including ADL's, fall risks, home safety risks and hearing or visual             impairment. 5. Diet and physical activities 6. Evidence for depression or mood disorders The patients weight, height, BMI and visual acuity have been recorded in the chart I have made referrals, counseling and provided education to the patient based review of the above and I have provided the pt with a written personalized care plan for preventive services.  Has quit drinking alcohol in last 2 weeks.  Has gained 3 lbs.  Having some issues with constipation.  Elevated Cholesterol:  Well controlle don atorvastatin Lab Results  Component Value Date   CHOL 212* 04/09/2012   HDL 74.00 04/09/2012   LDLCALC 61 11/12/2009   LDLDIRECT 125.2 04/09/2012   TRIG 114.0 04/09/2012   CHOLHDL 3 04/09/2012  Diet compliance: Working on healthy eating Exercise:walking some daily Other complaints:   On labs.. low potassium, low calcium and vit B12. Has started taking OTC potassium and B12 x 1 month.  Hypertension: Better control back on atenolol... Using medication without problems or lightheadedness: NOne Chest pain with exertion:None Edema:None Short of breath:Yes.Marland Kitchen COPD Average home BPs: 140/80s Other issues:   Review of Systems  Constitutional: Negative for fever, fatigue and unexpected weight change.  HENT: Negative for ear pain, congestion, sore throat, sneezing, trouble swallowing and sinus pressure.   Eyes: Negative for pain and itching.  Respiratory: Positive for shortness of breath. Negative for cough and wheezing.   Cardiovascular: Negative for  chest pain, palpitations and leg swelling.  Gastrointestinal: Positive for constipation. Negative for nausea, abdominal pain, diarrhea and blood in stool.  Genitourinary: Negative for dysuria, hematuria, vaginal discharge and difficulty urinating.  Skin: Negative for rash.  Neurological: Negative for syncope, weakness, light-headedness, numbness and headaches.  Psychiatric/Behavioral: Negative for confusion and dysphoric mood. The patient is not nervous/anxious.        Objective:   Physical Exam  Constitutional: Vital signs are normal. She appears well-developed and well-nourished. She is cooperative.  Non-toxic appearance. She does not appear ill. No distress.       Obese.. telangectasias on abdomen and face  HENT:  Head: Normocephalic.  Right Ear: Hearing, tympanic membrane, external ear and ear canal normal.  Left Ear: Hearing, tympanic membrane, external ear and ear canal normal.  Nose: Nose normal.  Eyes: Conjunctivae, EOM and lids are normal. Pupils are equal, round, and reactive to light. No foreign bodies found.  Neck: Trachea normal and normal range of motion. Neck supple. Carotid bruit is not present. No mass and no thyromegaly present.  Cardiovascular: Normal rate, regular rhythm, S1 normal, S2 normal, normal heart sounds and intact distal pulses.  Exam reveals no gallop.   No murmur heard. Pulmonary/Chest: Effort normal and breath sounds normal. No respiratory distress. She has no wheezes. She has no rhonchi. She has no rales.  Abdominal: Soft. Normal appearance and bowel sounds are normal. She exhibits no distension, no fluid wave, no abdominal bruit and no mass. There is no hepatosplenomegaly. There is no tenderness. There is no rebound, no guarding and no CVA tenderness. No hernia.  Lymphadenopathy:    She has no cervical adenopathy.    She has no axillary adenopathy.  Neurological: She is alert. She has normal strength. No cranial nerve deficit or sensory deficit.  Skin:  Skin is warm, dry and intact. No rash noted.  Psychiatric: Her speech is normal and behavior is normal. Judgment normal. Her mood appears not anxious. Cognition and memory are normal. She does not exhibit a depressed mood.          Assessment & Plan:  The patient's preventative maintenance and recommended screening tests for an annual wellness exam were reviewed in full today. Brought up to date unless services declined.  Counselled on the importance of diet, exercise, and its role in overall health and mortality. The patient's FH and SH was reviewed, including their home life, tobacco status, and drug and alcohol status.   Vaccines: uptodate with Td and PNA, considering shingles vaccine  Colon: Colonoscopy [facility]  Dr. Evette Cristal. 2006, repeat in 10 years.  Mammo: 02/2012 nml, breast exam with GYN.   DEXA: Due for this.Marland Kitchen bt GYN has machine in his office.   PAP/DVE: TAH, not indicated pap or DVE. See GYN Dr. Audie Box Smoker..Only 1 cig occ now. CXR nml 2009 will come back to have spirometry and discussion on lung status

## 2012-05-09 NOTE — Assessment & Plan Note (Signed)
Borderline control on only one med. Re-eval with lifestyle change at next OV.

## 2012-05-09 NOTE — Assessment & Plan Note (Signed)
On supplement.. Will reeval, likely will need B12 injections.

## 2012-05-09 NOTE — Patient Instructions (Addendum)
Increase exercise as able.Marland Kitchen 3-5 times a week. Great work on stopping alcohol. Increase water and fiber in your diet. Can sue metamucil as supplement.  Look into shingles vaccine coverage. Discuss with Dr. Audie Box... Bone density.

## 2012-08-07 ENCOUNTER — Ambulatory Visit (INDEPENDENT_AMBULATORY_CARE_PROVIDER_SITE_OTHER): Payer: Medicare Other

## 2012-08-07 DIAGNOSIS — Z23 Encounter for immunization: Secondary | ICD-10-CM

## 2012-09-25 DIAGNOSIS — Z8673 Personal history of transient ischemic attack (TIA), and cerebral infarction without residual deficits: Secondary | ICD-10-CM

## 2012-09-25 HISTORY — DX: Personal history of transient ischemic attack (TIA), and cerebral infarction without residual deficits: Z86.73

## 2012-11-12 ENCOUNTER — Encounter: Payer: Self-pay | Admitting: Family Medicine

## 2012-11-12 ENCOUNTER — Ambulatory Visit (INDEPENDENT_AMBULATORY_CARE_PROVIDER_SITE_OTHER): Payer: Medicare Other | Admitting: Family Medicine

## 2012-11-12 VITALS — BP 140/88 | HR 98 | Temp 98.0°F | Ht 64.0 in | Wt 246.0 lb

## 2012-11-12 DIAGNOSIS — J449 Chronic obstructive pulmonary disease, unspecified: Secondary | ICD-10-CM

## 2012-11-12 DIAGNOSIS — H00019 Hordeolum externum unspecified eye, unspecified eyelid: Secondary | ICD-10-CM

## 2012-11-12 DIAGNOSIS — F1010 Alcohol abuse, uncomplicated: Secondary | ICD-10-CM

## 2012-11-12 DIAGNOSIS — F17200 Nicotine dependence, unspecified, uncomplicated: Secondary | ICD-10-CM

## 2012-11-12 DIAGNOSIS — F17210 Nicotine dependence, cigarettes, uncomplicated: Secondary | ICD-10-CM

## 2012-11-12 MED ORDER — IPRATROPIUM-ALBUTEROL 18-103 MCG/ACT IN AERO: 2.0000 | INHALATION_SPRAY | Freq: Four times a day (QID) | RESPIRATORY_TRACT | Status: DC | PRN

## 2012-11-12 MED ORDER — ERYTHROMYCIN 5 MG/GM OP OINT: TOPICAL_OINTMENT | Freq: Two times a day (BID) | OPHTHALMIC | Status: DC

## 2012-11-12 MED ORDER — FLUTICASONE-SALMETEROL 250-50 MCG/DOSE IN AEPB: 1.0000 | INHALATION_SPRAY | Freq: Two times a day (BID) | RESPIRATORY_TRACT | Status: DC

## 2012-11-12 NOTE — Assessment & Plan Note (Signed)
Cpunsled on cessation. Total visit time 30 minutes, > 50% spent counseling and cordinating patients care. Gave info on detox programs and outpt rehab.

## 2012-11-12 NOTE — Assessment & Plan Note (Signed)
Treat with warm compresses and erythromycin ointment. If not improving see eye MD.

## 2012-11-12 NOTE — Assessment & Plan Note (Signed)
Dod not tolerate spiriva in past. Change to combivent prn and start daily advair 250/50. Return oin 1-2 months for repeat spirometry.  Commended her on quititing smoking and encouraged her to replace cig with e cigs and taper off.

## 2012-11-12 NOTE — Patient Instructions (Signed)
Start advair twice daily. Use combivent as needed. Follow up in 1-2 months for spirometry recheck. Use erythromycin gel for stye, warm compresses 2-3 times day. For alcohol cessation call: [phone] to ask about outpatient detaox and alcohol cessation programs. ( or call [phone] for other options)

## 2012-11-12 NOTE — Progress Notes (Signed)
  Subjective:    Patient ID: Melanie Schneider, female    DOB: [birth year], 67 y.o.   MRN: [id number]  HPI 67 year old female here for follow up COPD. She has been a smoker for  She reports that she struggles to breath walking long distances.. Can walk only 200 feet before getting short of breath and having to sit down. Gets winded going up a few steps.  No SOB at rest. Has daily cough, some mucus production. Worse in AM or at bedtime. No chest pain.  Rarely using albuterol. Spiriva helped with breathing but made her cough a lot.  She has decrease smoking to only rarely, 2  aday at most.  Using E cigarettes occ. Husband encouraging her to quit.  Alcohol abuse: She continues to drink but is interested in quitting. She is drinking a pint a day. She has tried cold [place] but has trouble stopping. She feels shaky when she stops. She has gone to rehab 2 times in past.. Last time in 90s.    Review of Systems  Constitutional: Negative for fever and fatigue.  HENT: Negative for ear pain.        Stye and redness in right eye.  Eyes: Negative for pain.  Respiratory: Negative for chest tightness and shortness of breath.   Cardiovascular: Negative for chest pain, palpitations and leg swelling.  Gastrointestinal: Negative for abdominal pain.  Genitourinary: Negative for dysuria.       Objective:   Physical Exam  Constitutional: Vital signs are normal. She appears well-developed and well-nourished. She is cooperative.  Non-toxic appearance. She does not appear ill. No distress.  HENT:  Head: Normocephalic.  Right Ear: Hearing, tympanic membrane, external ear and ear canal normal. Tympanic membrane is not erythematous, not retracted and not bulging.  Left Ear: Hearing, tympanic membrane, external ear and ear canal normal. Tympanic membrane is not erythematous, not retracted and not bulging.  Nose: No mucosal edema or rhinorrhea. Right sinus exhibits no maxillary sinus tenderness and no  frontal sinus tenderness. Left sinus exhibits no maxillary sinus tenderness and no frontal sinus tenderness.  Mouth/Throat: Uvula is midline, oropharynx is clear and moist and mucous membranes are normal.  Stye midline right lower lid, mild erythema.  Eyes: Conjunctivae, EOM and lids are normal. Pupils are equal, round, and reactive to light. No foreign bodies found.  Neck: Trachea normal and normal range of motion. Neck supple. Carotid bruit is not present. No mass and no thyromegaly present.  Cardiovascular: Normal rate, regular rhythm, S1 normal, S2 normal, normal heart sounds, intact distal pulses and normal pulses.  Exam reveals no gallop and no friction rub.   No murmur heard. Pulmonary/Chest: Effort normal and breath sounds normal. Not tachypneic. No respiratory distress. She has no decreased breath sounds. She has no wheezes. She has no rhonchi. She has no rales.  Abdominal: Soft. Normal appearance and bowel sounds are normal. There is no tenderness.  Neurological: She is alert.  Skin: Skin is warm, dry and intact. No rash noted.  Psychiatric: Her speech is normal and behavior is normal. Judgment and thought content normal. Her mood appears not anxious. Cognition and memory are normal. She does not exhibit a depressed mood.          Assessment & Plan:

## 2013-01-14 ENCOUNTER — Ambulatory Visit: Payer: Medicare Other | Admitting: Family Medicine

## 2013-05-23 ENCOUNTER — Encounter: Payer: Self-pay | Admitting: Family Medicine

## 2013-05-23 ENCOUNTER — Ambulatory Visit (INDEPENDENT_AMBULATORY_CARE_PROVIDER_SITE_OTHER): Payer: Medicare Other | Admitting: Family Medicine

## 2013-05-23 ENCOUNTER — Ambulatory Visit (INDEPENDENT_AMBULATORY_CARE_PROVIDER_SITE_OTHER)
Admission: RE | Admit: 2013-05-23 | Discharge: 2013-05-23 | Disposition: A | Discharge status: Home / Self Care | Payer: Medicare Other | Source: Ambulatory Visit | Attending: Family Medicine | Admitting: Family Medicine

## 2013-05-23 ENCOUNTER — Telehealth: Payer: Self-pay | Admitting: Family Medicine

## 2013-05-23 VITALS — BP 124/80 | HR 101 | Temp 98.2°F | Ht 64.0 in | Wt 240.0 lb

## 2013-05-23 DIAGNOSIS — M25569 Pain in unspecified knee: Secondary | ICD-10-CM

## 2013-05-23 DIAGNOSIS — R21 Rash and other nonspecific skin eruption: Secondary | ICD-10-CM | POA: Insufficient documentation

## 2013-05-23 DIAGNOSIS — M25562 Pain in left knee: Secondary | ICD-10-CM

## 2013-05-23 DIAGNOSIS — S83207A Unspecified tear of unspecified meniscus, current injury, left knee, initial encounter: Secondary | ICD-10-CM | POA: Insufficient documentation

## 2013-05-23 DIAGNOSIS — R3915 Urgency of urination: Secondary | ICD-10-CM | POA: Insufficient documentation

## 2013-05-23 LAB — POCT URINALYSIS DIPSTICK
Blood, UA: NEGATIVE
Glucose, UA: NEGATIVE
Ketones, UA: NEGATIVE
Leukocytes, UA: NEGATIVE
Nitrite, UA: POSITIVE
Spec Grav, UA: >=1.03
Urobilinogen, UA: 0.2
pH, UA: 6

## 2013-05-23 LAB — POCT UA - MICROSCOPIC ONLY

## 2013-05-23 MED ORDER — MELOXICAM 15 MG PO TABS: 15.0000 mg | ORAL_TABLET | Freq: Every day | ORAL | Status: DC

## 2013-05-23 MED ORDER — TRIAMCINOLONE 0.1 % CREAM:EUCERIN CREAM 1:1: 1.0000 "application " | TOPICAL_CREAM | Freq: Two times a day (BID) | CUTANEOUS | Status: DC

## 2013-05-23 NOTE — Assessment & Plan Note (Signed)
Possible infeciton... UA contaminated.. Send for culture.

## 2013-05-23 NOTE — Progress Notes (Signed)
Subjective:    Patient ID: Melanie Schneider, female    DOB: Jun 13, 1946, 67 y.o.   MRN: [id number]  HPI 67 year old female with multiple medical issues presetns with urinary urgency and left knee pain  1. Urinary urgency: ongoing in last month. Only urinating small amount at a time. Occ incontinence. No flow issues, no straining.  Occ pikish toilet paper, occ odor of urine. Intermittant dysuria. No urinary frequency  No fever.  Some left flank pain off and on in last week... None today.   Has history of UTI frequently when she was younger.   2.Left knee pain, ongoing x several months. She falls a lot due to peripheral neuropathy. No known specific injury. Pain greatest after sitting a while, then when she gets up, bending.Marland Kitchen occ locks up, occ popping. Pain is greatest in anterior knee.   Using cane now.  No past surgery of knees etc.  Very dry leathery skin on feet.  Review of Systems  Constitutional: Negative for fever and fatigue.  HENT: Negative for ear pain.   Eyes: Negative for pain.  Respiratory: Negative for chest tightness and shortness of breath.   Cardiovascular: Negative for chest pain, palpitations and leg swelling.  Gastrointestinal: Negative for abdominal pain.  Genitourinary: Negative for dysuria.       Objective:   Physical Exam  Constitutional: Vital signs are normal. She appears well-developed and well-nourished. She is cooperative.  Non-toxic appearance. She does not appear ill. No distress.  HENT:  Head: Normocephalic.  Right Ear: Hearing, tympanic membrane, external ear and ear canal normal. Tympanic membrane is not erythematous, not retracted and not bulging.  Left Ear: Hearing, tympanic membrane, external ear and ear canal normal. Tympanic membrane is not erythematous, not retracted and not bulging.  Nose: No mucosal edema or rhinorrhea. Right sinus exhibits no maxillary sinus tenderness and no frontal sinus tenderness. Left sinus exhibits no maxillary  sinus tenderness and no frontal sinus tenderness.  Mouth/Throat: Uvula is midline, oropharynx is clear and moist and mucous membranes are normal.  Eyes: Conjunctivae, EOM and lids are normal. Pupils are equal, round, and reactive to light. Lids are everted and swept, no foreign bodies found.  Neck: Trachea normal and normal range of motion. Neck supple. Carotid bruit is not present. No mass and no thyromegaly present.  Cardiovascular: Normal rate, regular rhythm, S1 normal, S2 normal, normal heart sounds, intact distal pulses and normal pulses.  Exam reveals no gallop and no friction rub.   No murmur heard. Pulmonary/Chest: Effort normal and breath sounds normal. Not tachypneic. No respiratory distress. She has no decreased breath sounds. She has no wheezes. She has no rhonchi. She has no rales.  Abdominal: Soft. Normal appearance and bowel sounds are normal. There is no tenderness. There is no CVA tenderness.  Musculoskeletal:       Left knee: She exhibits decreased range of motion and abnormal meniscus. She exhibits no swelling, no effusion and no MCL laxity. Tenderness found. Medial joint line and lateral joint line tenderness noted. No MCL, no LCL and no patellar tendon tenderness noted.  Positive Mcmurray's  Neurological: She is alert.  Skin: Skin is warm, dry and intact. No rash noted.  thickened leathery skin on feet and legs in stocking distribution.  Psychiatric: Her speech is normal and behavior is normal. Judgment and thought content normal. Her mood appears not anxious. Cognition and memory are normal. She does not exhibit a depressed mood.  Assessment & Plan:

## 2013-05-23 NOTE — Assessment & Plan Note (Signed)
Thickened skin likely from neuropathy.  Treat with eucerin triamcinolone compounded.

## 2013-05-23 NOTE — Addendum Note (Signed)
Addended by: Damita Lack on: 05/23/2013 12:10 PM   Modules accepted: Orders

## 2013-05-23 NOTE — Patient Instructions (Addendum)
We will call you with the X-ray results. Start meloxicam daily for knee pain. Can try the topical compounded eucerin steroid cream for feet. We willcal with the urine culture results.

## 2013-05-23 NOTE — Assessment & Plan Note (Signed)
Possible meniscal injury vs arthritis.  Eval with X-rays.  Start NSAID.. Meloxicam.  May need MRI knee for further eval.

## 2013-05-23 NOTE — Telephone Encounter (Signed)
Message copied by Excell Seltzer on Fri May 23, 2013  3:37 PM ------      Message from: Damita Lack      Created: Fri May 23, 2013  2:27 PM       Patient notified as instructed by telephone.  She is agreeable to do a MRI.  Advised Shirlee Limerick would be in touch next week with an appointment.  Will forward to Dr. Ermalene Searing to place an order for MRI. ------

## 2013-05-27 ENCOUNTER — Telehealth: Payer: Self-pay | Admitting: Family Medicine

## 2013-05-27 MED ORDER — CIPROFLOXACIN HCL 250 MG PO TABS: 250.0000 mg | ORAL_TABLET | Freq: Two times a day (BID) | ORAL | Status: DC

## 2013-05-27 NOTE — Telephone Encounter (Signed)
Notify pt ASAP... Ecoli in urine... Will treat with cipro 250 mg BID x 7 days. Rx sent in.

## 2013-05-27 NOTE — Telephone Encounter (Signed)
Patient noitfied as instructed by telephone.

## 2013-06-01 ENCOUNTER — Ambulatory Visit
Admission: RE | Admit: 2013-06-01 | Discharge: 2013-06-01 | Disposition: A | Discharge status: Home / Self Care | Payer: Medicare Other | Source: Ambulatory Visit | Attending: Family Medicine | Admitting: Family Medicine

## 2013-06-01 DIAGNOSIS — M25562 Pain in left knee: Secondary | ICD-10-CM

## 2013-06-03 ENCOUNTER — Telehealth: Payer: Self-pay | Admitting: Family Medicine

## 2013-06-03 DIAGNOSIS — S83207A Unspecified tear of unspecified meniscus, current injury, left knee, initial encounter: Secondary | ICD-10-CM

## 2013-06-03 NOTE — Telephone Encounter (Signed)
Message copied by Excell Seltzer on Tue Jun 03, 2013  2:21 PM ------      Message from: Damita Lack      Created: Tue Jun 03, 2013 12:05 PM       Patient notified as instructed by telephone.  She would like a referral to Ortho. ------

## 2013-06-10 ENCOUNTER — Inpatient Hospital Stay: Payer: Self-pay | Admitting: Internal Medicine

## 2013-06-10 DIAGNOSIS — I517 Cardiomegaly: Secondary | ICD-10-CM

## 2013-06-10 LAB — COMPREHENSIVE METABOLIC PANEL
Alkaline Phosphatase: 83 U/L (ref 50–136)
BUN: 10 mg/dL (ref 7–18)
Bilirubin,Total: 0.8 mg/dL (ref 0.2–1.0)
Calcium, Total: 8.1 mg/dL — ABNORMAL LOW (ref 8.5–10.1)
Chloride: 101 mmol/L (ref 98–107)
Co2: 39 mmol/L — ABNORMAL HIGH (ref 21–32)
Creatinine: 0.49 mg/dL — ABNORMAL LOW (ref 0.60–1.30)
EGFR (Non-African Amer.): >60
Glucose: 112 mg/dL — ABNORMAL HIGH (ref 65–99)
Osmolality: 285 (ref 275–301)
SGOT(AST): 35 U/L (ref 15–37)
SGPT (ALT): 31 U/L (ref 12–78)
Sodium: 143 mmol/L (ref 136–145)
Total Protein: 6.9 g/dL (ref 6.4–8.2)

## 2013-06-10 LAB — URINALYSIS, COMPLETE
Blood: NEGATIVE
Glucose,UR: NEGATIVE mg/dL (ref 0–75)
Ketone: NEGATIVE
Nitrite: NEGATIVE
Specific Gravity: 1.025 (ref 1.003–1.030)

## 2013-06-10 LAB — LIPID PANEL
HDL Cholesterol: 25 mg/dL — ABNORMAL LOW (ref 40–60)
Ldl Cholesterol, Calc: 80 mg/dL (ref 0–100)
Triglycerides: 108 mg/dL (ref 0–200)

## 2013-06-10 LAB — CBC WITH DIFFERENTIAL/PLATELET
HGB: 15 g/dL (ref 12.0–16.0)
Lymphocytes: 20 %
MCV: 106 fL — ABNORMAL HIGH (ref 80–100)
Monocytes: 4 %
NRBC/100 WBC: 4 /
Platelet: 219 10*3/uL (ref 150–440)
RBC: 4.42 10*6/uL (ref 3.80–5.20)
RDW: 19.6 % — ABNORMAL HIGH (ref 11.5–14.5)
WBC: 6.9 10*3/uL (ref 3.6–11.0)

## 2013-06-10 LAB — PRO B NATRIURETIC PEPTIDE: B-Type Natriuretic Peptide: 5064 pg/mL — ABNORMAL HIGH (ref 0–125)

## 2013-06-10 LAB — CK TOTAL AND CKMB (NOT AT ARMC): CK-MB: 1.1 ng/mL (ref 0.5–3.6)

## 2013-06-10 LAB — APTT: Activated PTT: 32.8 secs (ref 23.6–35.9)

## 2013-06-10 LAB — PROTIME-INR
INR: 1.1
Prothrombin Time: 14.6 secs (ref 11.5–14.7)

## 2013-06-11 LAB — CBC WITH DIFFERENTIAL/PLATELET
Basophil #: 0 10*3/uL (ref 0.0–0.1)
Lymphocyte #: 0.6 10*3/uL — ABNORMAL LOW (ref 1.0–3.6)
Lymphocyte %: 7.8 %
MCH: 33.4 pg (ref 26.0–34.0)
MCV: 104 fL — ABNORMAL HIGH (ref 80–100)
Monocyte %: 3.5 %
Neutrophil #: 7 10*3/uL — ABNORMAL HIGH (ref 1.4–6.5)
Neutrophil %: 88.6 %
Platelet: 239 10*3/uL (ref 150–440)
RBC: 4.93 10*6/uL (ref 3.80–5.20)
RDW: 18.9 % — ABNORMAL HIGH (ref 11.5–14.5)

## 2013-06-11 LAB — COMPREHENSIVE METABOLIC PANEL
Albumin: 2.8 g/dL — ABNORMAL LOW (ref 3.4–5.0)
Bilirubin,Total: 0.9 mg/dL (ref 0.2–1.0)
Co2: 36 mmol/L — ABNORMAL HIGH (ref 21–32)
Creatinine: 0.75 mg/dL (ref 0.60–1.30)
Glucose: 241 mg/dL — ABNORMAL HIGH (ref 65–99)
Potassium: 3.8 mmol/L (ref 3.5–5.1)
SGPT (ALT): 29 U/L (ref 12–78)
Sodium: 140 mmol/L (ref 136–145)
Total Protein: 6.9 g/dL (ref 6.4–8.2)

## 2013-06-11 LAB — URINE CULTURE

## 2013-06-11 LAB — TROPONIN I: Troponin-I: 0.03 ng/mL

## 2013-06-11 LAB — MAGNESIUM: Magnesium: 1.5 mg/dL — ABNORMAL LOW

## 2013-06-12 LAB — CBC WITH DIFFERENTIAL/PLATELET
Eosinophil #: 0 10*3/uL (ref 0.0–0.7)
HCT: 53.5 % — ABNORMAL HIGH (ref 35.0–47.0)
HGB: 17.3 g/dL — ABNORMAL HIGH (ref 12.0–16.0)
Lymphocyte #: 0.4 10*3/uL — ABNORMAL LOW (ref 1.0–3.6)
MCH: 33.5 pg (ref 26.0–34.0)
MCHC: 32.3 g/dL (ref 32.0–36.0)
Monocyte %: 3.6 %
Neutrophil #: 10.6 10*3/uL — ABNORMAL HIGH (ref 1.4–6.5)
Neutrophil %: 92.7 %
RBC: 5.16 10*6/uL (ref 3.80–5.20)
WBC: 11.4 10*3/uL — ABNORMAL HIGH (ref 3.6–11.0)

## 2013-06-12 LAB — BASIC METABOLIC PANEL
Co2: 30 mmol/L (ref 21–32)
Creatinine: 0.65 mg/dL (ref 0.60–1.30)
EGFR (Non-African Amer.): >60
Glucose: 168 mg/dL — ABNORMAL HIGH (ref 65–99)
Potassium: 3.8 mmol/L (ref 3.5–5.1)

## 2013-06-13 LAB — CBC WITH DIFFERENTIAL/PLATELET
Basophil #: 0 10*3/uL (ref 0.0–0.1)
Eosinophil #: 0 10*3/uL (ref 0.0–0.7)
Eosinophil %: 0 %
HCT: 50.8 % — ABNORMAL HIGH (ref 35.0–47.0)
Lymphocyte #: 0.2 10*3/uL — ABNORMAL LOW (ref 1.0–3.6)
Lymphocyte %: 1.8 %
MCHC: 32.6 g/dL (ref 32.0–36.0)
MCV: 102 fL — ABNORMAL HIGH (ref 80–100)
Monocyte #: 0.4 x10 3/mm (ref 0.2–0.9)
Monocyte %: 4.1 %
Neutrophil %: 93.7 %
Platelet: 260 10*3/uL (ref 150–440)
RBC: 4.97 10*6/uL (ref 3.80–5.20)

## 2013-06-13 LAB — BASIC METABOLIC PANEL
Calcium, Total: 7.8 mg/dL — ABNORMAL LOW (ref 8.5–10.1)
Chloride: 100 mmol/L (ref 98–107)
EGFR (Non-African Amer.): >60
Glucose: 163 mg/dL — ABNORMAL HIGH (ref 65–99)
Osmolality: 283 (ref 275–301)
Potassium: 3.5 mmol/L (ref 3.5–5.1)
Sodium: 139 mmol/L (ref 136–145)

## 2013-06-15 LAB — CBC WITH DIFFERENTIAL/PLATELET
Basophil #: 0 10*3/uL (ref 0.0–0.1)
Eosinophil #: 0 10*3/uL (ref 0.0–0.7)
Eosinophil %: 0.2 %
HGB: 15.8 g/dL (ref 12.0–16.0)
Lymphocyte #: 1.2 10*3/uL (ref 1.0–3.6)
MCHC: 31.9 g/dL — ABNORMAL LOW (ref 32.0–36.0)
MCV: 103 fL — ABNORMAL HIGH (ref 80–100)
Monocyte %: 11.4 %
Neutrophil %: 73.8 %
RBC: 4.82 10*6/uL (ref 3.80–5.20)
RDW: 18.2 % — ABNORMAL HIGH (ref 11.5–14.5)
WBC: 8 10*3/uL (ref 3.6–11.0)

## 2013-06-15 LAB — BASIC METABOLIC PANEL
Calcium, Total: 8.3 mg/dL — ABNORMAL LOW (ref 8.5–10.1)
Chloride: 106 mmol/L (ref 98–107)
Creatinine: 0.66 mg/dL (ref 0.60–1.30)
EGFR (African American): >60
Glucose: 93 mg/dL (ref 65–99)
Potassium: 3.3 mmol/L — ABNORMAL LOW (ref 3.5–5.1)
Sodium: 145 mmol/L (ref 136–145)

## 2013-06-15 LAB — CULTURE, BLOOD (SINGLE)

## 2013-06-16 LAB — BASIC METABOLIC PANEL
Anion Gap: 4 — ABNORMAL LOW (ref 7–16)
Chloride: 104 mmol/L (ref 98–107)
Co2: 36 mmol/L — ABNORMAL HIGH (ref 21–32)
Creatinine: 0.58 mg/dL — ABNORMAL LOW (ref 0.60–1.30)
EGFR (Non-African Amer.): >60
Glucose: 87 mg/dL (ref 65–99)
Potassium: 3.8 mmol/L (ref 3.5–5.1)
Sodium: 144 mmol/L (ref 136–145)

## 2013-06-16 LAB — MAGNESIUM: Magnesium: 2 mg/dL

## 2013-06-24 ENCOUNTER — Encounter: Payer: Self-pay | Admitting: Family Medicine

## 2013-06-24 ENCOUNTER — Ambulatory Visit (INDEPENDENT_AMBULATORY_CARE_PROVIDER_SITE_OTHER): Payer: Medicare Other | Admitting: Family Medicine

## 2013-06-24 VITALS — BP 100/80 | HR 87 | Temp 98.6°F | Ht 64.0 in | Wt 224.8 lb

## 2013-06-24 DIAGNOSIS — R3915 Urgency of urination: Secondary | ICD-10-CM

## 2013-06-24 DIAGNOSIS — R5381 Other malaise: Secondary | ICD-10-CM

## 2013-06-24 DIAGNOSIS — I10 Essential (primary) hypertension: Secondary | ICD-10-CM

## 2013-06-24 DIAGNOSIS — I5032 Chronic diastolic (congestive) heart failure: Secondary | ICD-10-CM

## 2013-06-24 DIAGNOSIS — F1010 Alcohol abuse, uncomplicated: Secondary | ICD-10-CM

## 2013-06-24 DIAGNOSIS — J449 Chronic obstructive pulmonary disease, unspecified: Secondary | ICD-10-CM

## 2013-06-24 DIAGNOSIS — J9600 Acute respiratory failure, unspecified whether with hypoxia or hypercapnia: Secondary | ICD-10-CM

## 2013-06-24 DIAGNOSIS — R5383 Other fatigue: Secondary | ICD-10-CM

## 2013-06-24 DIAGNOSIS — J4489 Other specified chronic obstructive pulmonary disease: Secondary | ICD-10-CM

## 2013-06-24 LAB — POCT URINALYSIS DIPSTICK
Bilirubin, UA: NEGATIVE
Glucose, UA: NEGATIVE
Ketones, UA: NEGATIVE
Nitrite, UA: NEGATIVE
Urobilinogen, UA: 0.2
pH, UA: 8

## 2013-06-24 LAB — POCT UA - MICROSCOPIC ONLY

## 2013-06-24 NOTE — Assessment & Plan Note (Signed)
BP control and use lasix on prn only basis

## 2013-06-24 NOTE — Assessment & Plan Note (Signed)
She has now quit alcohol  ( detoxed in hospital) and I have encouraged her to remain off alcohol. Pt's husband is in agreement.

## 2013-06-24 NOTE — Assessment & Plan Note (Addendum)
COPD exac resolved... Today pt oxygen saturation off oygen dropped on walking to 77 %. Continue oxygen for now.

## 2013-06-24 NOTE — Assessment & Plan Note (Signed)
Small Blood in urine. Send for culture... If negative. Consider referral to uro for further evaluation.

## 2013-06-24 NOTE — Assessment & Plan Note (Signed)
Multifactorial. Secondary to COPRD, diastolic CHF, ? Aspiration pneumonitis?  resolved.

## 2013-06-24 NOTE — Patient Instructions (Addendum)
Continue oxygen for now. You dropped oxygen to 77 % with walking off oxygen. Use miralax daily for constipation. Increase water and fiber in your diet. Stay off alcohol. Stay off blood pressure medicaiton for now.  We will call with urine culture results.

## 2013-06-24 NOTE — Assessment & Plan Note (Signed)
Likely secondary to deconditioning following hospitalization.

## 2013-06-24 NOTE — Progress Notes (Signed)
Subjective:    Patient ID: Melanie Schneider, female    DOB: 07/11/1946, 67 y.o.   MRN: [id number]  HPI 67 year old female with history of moderately severe COPD presents for hospital follow up from [facility] on following respiratory failure,  Diastolic CHF, aspiration pneumonitis, and  alcohol withdrawal. She was admitted from 9/16 to 9/22.  Her husband had noted slurred speech and confusion x several days. When she arrived at the ER she was found to be hypoxic ( pulse ox 38% on EMS) and cyanotic. She was placed on bipap with improvement in awareness level.  Her acute hypercapnic, hypoxic respiratory failure was felt to be secondary to COPD and possible CHF with aspiration pneumonitis.  CXR showed cardiomegaly and minimal right base atelectasis or infiltrates. Blood cultures neg, wbc 6.9, hg 15, Cr 0.49, neg CE, urine culture neg. ECHo showed EF of 50-55%.  She was treated with steroids, nebs, lasix,levofloxacin bipap and slowly progressed to oxygen via nasal cannula. She had some bradycardia .Marland Kitchen Which improved when BBlockers were held.  She reports that she remains fatigued and weak. She has fallen once since she has been home. She is on continous oxygen at 2 L. She is receiving homehealth nurse. She reports she is breathing well, no wheeze. NO fever. Cough is improved.  No chest pain. Her mental status is slowly improving.. shedoes still have some slowed thinking and    Loss of control of urine of since hospitalization.  She was catheterized for several days in the hospital.  She was being treated for [facility] UTi with cipro on 9/2.Marland Kitchen But did not take it all or regularly. She feels that  She never had resolution of symptoms.  She has not been drinking alcohol since she has been out of thes hospital.       Review of Systems  Constitutional: Positive for fatigue. Negative for fever.  HENT: Negative for ear pain.   Eyes: Negative for pain.  Respiratory: Negative for chest tightness and  shortness of breath.   Cardiovascular: Negative for chest pain, palpitations and leg swelling.  Gastrointestinal: Positive for constipation. Negative for abdominal pain.       Using senekot S and purelax to try with not relief  Genitourinary: Negative for dysuria.       Objective:   Physical Exam  Constitutional: She is oriented to person, place, and time. Vital signs are normal. She appears well-developed and well-nourished. She is cooperative.  Non-toxic appearance. She does not appear ill. No distress.  Overweight female with leg weakness Spider telangectasias on B cheeks  HENT:  Head: Normocephalic.  Right Ear: Hearing, tympanic membrane, external ear and ear canal normal. Tympanic membrane is not erythematous, not retracted and not bulging.  Left Ear: Hearing, tympanic membrane, external ear and ear canal normal. Tympanic membrane is not erythematous, not retracted and not bulging.  Nose: No mucosal edema or rhinorrhea. Right sinus exhibits no maxillary sinus tenderness and no frontal sinus tenderness. Left sinus exhibits no maxillary sinus tenderness and no frontal sinus tenderness.  Mouth/Throat: Uvula is midline, oropharynx is clear and moist and mucous membranes are normal.  Eyes: Conjunctivae, EOM and lids are normal. Pupils are equal, round, and reactive to light. Lids are everted and swept, no foreign bodies found.  Neck: Trachea normal and normal range of motion. Neck supple. Carotid bruit is not present. No mass and no thyromegaly present.  Cardiovascular: Normal rate, regular rhythm, S1 normal, S2 normal, normal heart sounds, intact distal pulses and  normal pulses.  Exam reveals no gallop and no friction rub.   No murmur heard. Pulmonary/Chest: Effort normal and breath sounds normal. Not tachypneic. No respiratory distress. She has no decreased breath sounds. She has no wheezes. She has no rhonchi. She has no rales.  Abdominal: Soft. Normal appearance and bowel sounds are  normal. There is no tenderness.  Neurological: She is alert and oriented to person, place, and time.  Skin: Skin is warm, dry and intact. No rash noted.  Psychiatric: Her speech is normal and behavior is normal. Judgment and thought content normal. Her mood appears not anxious. Cognition and memory are normal. She does not exhibit a depressed mood.          Assessment & Plan:

## 2013-06-24 NOTE — Assessment & Plan Note (Signed)
Bp running low off amlodipine . Stay off.  Follow at home. Close follow up in 2weeks.

## 2013-06-25 ENCOUNTER — Telehealth: Payer: Self-pay

## 2013-06-25 ENCOUNTER — Telehealth: Payer: Self-pay | Admitting: Family Medicine

## 2013-06-25 NOTE — Telephone Encounter (Signed)
Pts daughter left v/m wanting to verify that pt should be taking Advair and Spiriva at the same time. May contact pt at [phone].Marland KitchenPlease advise.

## 2013-06-25 NOTE — Telephone Encounter (Signed)
Melanie Schneider PT [facility] left v/m requesting verbal order for PT Home Health 2 x a week for 4 weeks and 1 x a week for gait training and strengthening exercises.Please advise.

## 2013-06-26 LAB — URINE CULTURE: Colony Count: 60000

## 2013-06-26 NOTE — Telephone Encounter (Signed)
Melanie Schneider notified she should be using both Spiriva and Advair.  Asking about Proventil inhaler.  I advised that is usually a as needed inhaler for wheezing.  Melanie Schneider states that they looked up Spiriva on the Internet and it states that taking that medication increases your stroke risk by 30%.  I advised patient to continue with both Spiriva and Advair and she can address that concern with Dr. Ermalene Searing at her follow up appointment in two weeks.  Patient in agreement.

## 2013-06-26 NOTE — Telephone Encounter (Signed)
Yes... Needs both. The medications work differently to treat COPD. Good question though, I am glad she is helping her mother out.

## 2013-06-26 NOTE — Telephone Encounter (Signed)
Okay to give verbal order.

## 2013-06-26 NOTE — Telephone Encounter (Signed)
Verbal order given to Ann with [facility] for physical therapy.

## 2013-06-27 DIAGNOSIS — J449 Chronic obstructive pulmonary disease, unspecified: Secondary | ICD-10-CM

## 2013-06-27 DIAGNOSIS — J690 Pneumonitis due to inhalation of food and vomit: Secondary | ICD-10-CM

## 2013-06-27 DIAGNOSIS — I509 Heart failure, unspecified: Secondary | ICD-10-CM

## 2013-06-27 DIAGNOSIS — I5031 Acute diastolic (congestive) heart failure: Secondary | ICD-10-CM

## 2013-07-07 ENCOUNTER — Other Ambulatory Visit: Payer: Self-pay | Admitting: *Deleted

## 2013-07-07 ENCOUNTER — Telehealth: Payer: Self-pay

## 2013-07-07 MED ORDER — FUROSEMIDE 20 MG PO TABS: 20.0000 mg | ORAL_TABLET | Freq: Every day | ORAL | Status: DC | PRN

## 2013-07-07 NOTE — Telephone Encounter (Signed)
Ann PT with [facility] is discharging pt from PT home health because pt is driving now and cannot receive home health services; pt has appt to see Dr Ermalene Searing on 07/08/13 and Dewayne Hatch recommends continuing outpatient PT for balance.Please advise.

## 2013-07-08 ENCOUNTER — Ambulatory Visit (INDEPENDENT_AMBULATORY_CARE_PROVIDER_SITE_OTHER): Payer: Medicare Other | Admitting: Family Medicine

## 2013-07-08 ENCOUNTER — Encounter: Payer: Self-pay | Admitting: Family Medicine

## 2013-07-08 VITALS — BP 114/76 | HR 77 | Temp 97.7°F | Ht 64.0 in | Wt 229.5 lb

## 2013-07-08 DIAGNOSIS — J9600 Acute respiratory failure, unspecified whether with hypoxia or hypercapnia: Secondary | ICD-10-CM

## 2013-07-08 DIAGNOSIS — F1010 Alcohol abuse, uncomplicated: Secondary | ICD-10-CM

## 2013-07-08 DIAGNOSIS — I5032 Chronic diastolic (congestive) heart failure: Secondary | ICD-10-CM

## 2013-07-08 DIAGNOSIS — K5900 Constipation, unspecified: Secondary | ICD-10-CM

## 2013-07-08 DIAGNOSIS — R319 Hematuria, unspecified: Secondary | ICD-10-CM

## 2013-07-08 DIAGNOSIS — M25562 Pain in left knee: Secondary | ICD-10-CM

## 2013-07-08 DIAGNOSIS — M25569 Pain in unspecified knee: Secondary | ICD-10-CM

## 2013-07-08 LAB — POCT URINALYSIS DIPSTICK
Bilirubin, UA: NEGATIVE
Blood, UA: NEGATIVE
Protein, UA: NEGATIVE
Spec Grav, UA: 1.015
Urobilinogen, UA: 0.2
pH, UA: 6

## 2013-07-08 NOTE — Patient Instructions (Addendum)
Call to make appt with Dr. Christian Mate.  We will let Dr. Christian Mate make referral to PT.  Okay to wear the knee brace. Can use meloxicam for pain. Okay to stop oxygen can call Home health to have them pick up oxygen.  Stop stool softeners/laxative... Start miralax daily for constipation.  Follow up in 3 months for CPX with fasting labs prior.

## 2013-07-08 NOTE — Telephone Encounter (Signed)
Agree with discharge from home health. Will put in referral for PT at time of pt appt.

## 2013-07-08 NOTE — Progress Notes (Signed)
Subjective:    Patient ID: Melanie Schneider, female    DOB: [birth year], 67 y.o.   MRN: [id number]  HPI  67 year old female with multiple medical issues and recent hospitalization for acute respiratory failure, multifactorial presents for follow up.  At last OV 1 month ago  1. WEAKNESS -     Likely secondary to deconditioning following hospitalization.     2. COPD, moderate severe -    COPD exac resolved... Today pt oxygen saturation off oygen dropped on walking to 77 %. Continue oxygen for now.    3. Acute respiratory failure -      Multifactorial. Secondary to COPD, diastolic CHF, ? Aspiration pneumonitis?  resolved.   4. ABUSE, ALCOHOL, CONTINUOUS -       She has now quit alcohol  ( detoxed in hospital) and I have encouraged her to remain off alcohol. Pt's husband is in agreement.    5.  Urinary urgency -     Small Blood in urine. Send for culture... If negative. Consider referral to uro for further evaluation.    6.  HYPERTENSION -      Bp running low off amlodipine . Stay off.  Follow at home. Close follow up in 2weeks. 7.  Chronic diastolic heart failure -     BP control and use lasix on prn only basis     Today she reports that she is doing better with strength and has improved breathing.  No cough.  She has remained off alcohol and tobacco.  Her Home Health has measured nml BPs 106-151/80-90.  Also per Home Health...O2 96% after 10 min of no oxygen... HR 95 95% after 20 min  Off oxygen HR 111.  BP remains well controlled off amlodipine Urine culture at last OV was negative... Will recheck today for hematuria. Home health PT stated on phone note she has been discharged... Still recommend outpatient PT.  Ms. Schwebach reports that PT has been hurting her knee more ( Has  Acute meniscal tear). Last steroid injection. Saw Dr. Christian Mate.. She is not not great surgical candidate. Missed follow up appt.  No further urinary urgency, leaking is better.       Review of  Systems  Constitutional: Negative for fever and fatigue.  HENT: Negative for ear pain.   Eyes: Negative for pain.  Respiratory: Negative for chest tightness and shortness of breath.   Cardiovascular: Negative for chest pain, palpitations and leg swelling.  Gastrointestinal: Negative for abdominal pain.  Genitourinary: Negative for dysuria.       Objective:   Physical Exam  Constitutional: She is oriented to person, place, and time. Vital signs are normal. She appears well-developed and well-nourished. She is cooperative.  Non-toxic appearance. She does not appear ill. No distress.  Overweight female with leg weakness Spider telangectasias on B cheeks  HENT:  Head: Normocephalic.  Right Ear: Hearing, tympanic membrane, external ear and ear canal normal. Tympanic membrane is not erythematous, not retracted and not bulging.  Left Ear: Hearing, tympanic membrane, external ear and ear canal normal. Tympanic membrane is not erythematous, not retracted and not bulging.  Nose: No mucosal edema or rhinorrhea. Right sinus exhibits no maxillary sinus tenderness and no frontal sinus tenderness. Left sinus exhibits no maxillary sinus tenderness and no frontal sinus tenderness.  Mouth/Throat: Uvula is midline, oropharynx is clear and moist and mucous membranes are normal.  Eyes: Conjunctivae, EOM and lids are normal. Pupils are equal, round, and reactive to light. Lids are everted  and swept, no foreign bodies found.  Neck: Trachea normal and normal range of motion. Neck supple. Carotid bruit is not present. No mass and no thyromegaly present.  Cardiovascular: Normal rate, regular rhythm, S1 normal, S2 normal, normal heart sounds, intact distal pulses and normal pulses.  Exam reveals no gallop and no friction rub.   No murmur heard. B edema 1-2 plus  Pulmonary/Chest: Effort normal and breath sounds normal. Not tachypneic. No respiratory distress. She has no decreased breath sounds. She has no wheezes.  She has no rhonchi. She has no rales.  Abdominal: Soft. Normal appearance and bowel sounds are normal. There is no tenderness.  Musculoskeletal:       Left knee: She exhibits decreased range of motion, swelling, effusion and abnormal meniscus. She exhibits no bony tenderness. Tenderness found. Medial joint line and lateral joint line tenderness noted. No MCL, no LCL and no patellar tendon tenderness noted.  Neurological: She is alert and oriented to person, place, and time.  Skin: Skin is warm, dry and intact. No rash noted.  Psychiatric: Her speech is normal and behavior is normal. Judgment and thought content normal. Her mood appears not anxious. Cognition and memory are normal. She does not exhibit a depressed mood.          Assessment & Plan:

## 2013-07-10 ENCOUNTER — Encounter: Payer: Self-pay | Admitting: *Deleted

## 2013-07-11 ENCOUNTER — Telehealth: Payer: Self-pay | Admitting: Family Medicine

## 2013-07-11 DIAGNOSIS — K5900 Constipation, unspecified: Secondary | ICD-10-CM | POA: Insufficient documentation

## 2013-07-11 NOTE — Telephone Encounter (Signed)
Rx in out box

## 2013-07-11 NOTE — Assessment & Plan Note (Signed)
Resolved. No longer oxygen dependant per HomeHealth measurements. D/C oxygen.

## 2013-07-11 NOTE — Assessment & Plan Note (Signed)
Stop stool softeners/laxative... Start miralax daily for constipation.

## 2013-07-11 NOTE — Telephone Encounter (Signed)
Pt called, was instructed at last appointment to discontinue oxygen.  Need to Fax an order for discontinuing oxygen to [facility] Attn: Intake Department.  Fax # [phone].  If this is appropriate, please place and order.

## 2013-07-11 NOTE — Assessment & Plan Note (Signed)
Minimal fluid overload today.

## 2013-07-11 NOTE — Assessment & Plan Note (Signed)
Reinjury of acute meniscal tear during PT. Hold on PT until evaliuated further... Refer back to ortho.

## 2013-07-11 NOTE — Assessment & Plan Note (Signed)
She has remained off this medication.

## 2013-07-14 NOTE — Telephone Encounter (Signed)
D/C order faxed to [facility] Intake Department at [phone].

## 2013-08-13 ENCOUNTER — Ambulatory Visit (INDEPENDENT_AMBULATORY_CARE_PROVIDER_SITE_OTHER): Payer: Medicare Other

## 2013-08-13 DIAGNOSIS — Z23 Encounter for immunization: Secondary | ICD-10-CM

## 2013-10-05 ENCOUNTER — Telehealth: Payer: Self-pay | Admitting: Family Medicine

## 2013-10-05 DIAGNOSIS — E876 Hypokalemia: Secondary | ICD-10-CM

## 2013-10-05 DIAGNOSIS — R7309 Other abnormal glucose: Secondary | ICD-10-CM

## 2013-10-05 DIAGNOSIS — E538 Deficiency of other specified B group vitamins: Secondary | ICD-10-CM

## 2013-10-05 DIAGNOSIS — E7800 Pure hypercholesterolemia, unspecified: Secondary | ICD-10-CM

## 2013-10-05 DIAGNOSIS — I10 Essential (primary) hypertension: Secondary | ICD-10-CM

## 2013-10-05 NOTE — Telephone Encounter (Signed)
Message copied by Jinny Sanders on Sun Oct 05, 2013 11:38 PM ------      Message from: [name]      Created: Fri Sep 26, 2013  8:46 AM      Regarding: Lab orders for Monday, 1.12.15       Patient is scheduled for CPX labs, please order future labs, Thanks , Melanie Schneider       ------

## 2013-10-06 ENCOUNTER — Other Ambulatory Visit: Payer: Medicare Other

## 2013-10-07 ENCOUNTER — Other Ambulatory Visit (INDEPENDENT_AMBULATORY_CARE_PROVIDER_SITE_OTHER): Payer: Medicare HMO

## 2013-10-07 DIAGNOSIS — E876 Hypokalemia: Secondary | ICD-10-CM

## 2013-10-07 DIAGNOSIS — R5381 Other malaise: Secondary | ICD-10-CM

## 2013-10-07 DIAGNOSIS — I10 Essential (primary) hypertension: Secondary | ICD-10-CM

## 2013-10-07 DIAGNOSIS — E7800 Pure hypercholesterolemia, unspecified: Secondary | ICD-10-CM

## 2013-10-07 DIAGNOSIS — R7309 Other abnormal glucose: Secondary | ICD-10-CM

## 2013-10-07 DIAGNOSIS — R5383 Other fatigue: Secondary | ICD-10-CM

## 2013-10-07 DIAGNOSIS — E538 Deficiency of other specified B group vitamins: Secondary | ICD-10-CM

## 2013-10-07 LAB — COMPREHENSIVE METABOLIC PANEL
ALBUMIN: 3.4 g/dL — AB (ref 3.5–5.2)
ALT: 17 U/L (ref 0–35)
AST: 21 U/L (ref 0–37)
Alkaline Phosphatase: 73 U/L (ref 39–117)
BUN: 9 mg/dL (ref 6–23)
CO2: 31 mEq/L (ref 19–32)
CREATININE: 0.6 mg/dL (ref 0.4–1.2)
Calcium: 8.2 mg/dL — ABNORMAL LOW (ref 8.4–10.5)
Chloride: 103 mEq/L (ref 96–112)
GFR: 105.85 mL/min (ref >60.00)
GLUCOSE: 87 mg/dL (ref 70–99)
POTASSIUM: 3.3 meq/L — AB (ref 3.5–5.1)
Sodium: 143 mEq/L (ref 135–145)
Total Bilirubin: 0.7 mg/dL (ref 0.3–1.2)
Total Protein: 6.9 g/dL (ref 6.0–8.3)

## 2013-10-07 LAB — LIPID PANEL
CHOLESTEROL: 209 mg/dL — AB (ref 0–200)
HDL: 77.3 mg/dL (ref >39.00)
TRIGLYCERIDES: 126 mg/dL (ref 0.0–149.0)
Total CHOL/HDL Ratio: 3
VLDL: 25.2 mg/dL (ref 0.0–40.0)

## 2013-10-07 LAB — CBC WITH DIFFERENTIAL/PLATELET
BASOS ABS: 0 10*3/uL (ref 0.0–0.1)
Basophils Relative: 0.4 % (ref 0.0–3.0)
Eosinophils Absolute: 0.1 10*3/uL (ref 0.0–0.7)
Eosinophils Relative: 1.8 % (ref 0.0–5.0)
HEMATOCRIT: 41.5 % (ref 36.0–46.0)
Hemoglobin: 13.8 g/dL (ref 12.0–15.0)
LYMPHS ABS: 2 10*3/uL (ref 0.7–4.0)
Lymphocytes Relative: 30.5 % (ref 12.0–46.0)
MCHC: 33.2 g/dL (ref 30.0–36.0)
MCV: 92.8 fl (ref 78.0–100.0)
MONO ABS: 0.5 10*3/uL (ref 0.1–1.0)
Monocytes Relative: 7 % (ref 3.0–12.0)
NEUTROS PCT: 60.3 % (ref 43.0–77.0)
Neutro Abs: 3.9 10*3/uL (ref 1.4–7.7)
PLATELETS: 236 10*3/uL (ref 150.0–400.0)
RBC: 4.47 Mil/uL (ref 3.87–5.11)
RDW: 19.4 % — AB (ref 11.5–14.6)
WBC: 6.6 10*3/uL (ref 4.5–10.5)

## 2013-10-07 LAB — VITAMIN B12: VITAMIN B 12: 175 pg/mL — AB (ref 211–911)

## 2013-10-07 LAB — HEMOGLOBIN A1C: Hgb A1c MFr Bld: 5.3 % (ref 4.6–6.5)

## 2013-10-08 LAB — LDL CHOLESTEROL, DIRECT: Direct LDL: 119.9 mg/dL

## 2013-10-10 ENCOUNTER — Encounter: Payer: Self-pay | Admitting: Family Medicine

## 2013-10-10 ENCOUNTER — Ambulatory Visit (INDEPENDENT_AMBULATORY_CARE_PROVIDER_SITE_OTHER): Payer: Medicare HMO | Admitting: Family Medicine

## 2013-10-10 VITALS — BP 140/80 | HR 95 | Temp 97.7°F | Ht 63.5 in | Wt 235.5 lb

## 2013-10-10 DIAGNOSIS — E7800 Pure hypercholesterolemia, unspecified: Secondary | ICD-10-CM

## 2013-10-10 DIAGNOSIS — E876 Hypokalemia: Secondary | ICD-10-CM

## 2013-10-10 DIAGNOSIS — Z0000 Encounter for general adult medical examination without abnormal findings: Secondary | ICD-10-CM

## 2013-10-10 DIAGNOSIS — I10 Essential (primary) hypertension: Secondary | ICD-10-CM

## 2013-10-10 DIAGNOSIS — Z23 Encounter for immunization: Secondary | ICD-10-CM

## 2013-10-10 DIAGNOSIS — E538 Deficiency of other specified B group vitamins: Secondary | ICD-10-CM

## 2013-10-10 DIAGNOSIS — I5032 Chronic diastolic (congestive) heart failure: Secondary | ICD-10-CM

## 2013-10-10 DIAGNOSIS — R7309 Other abnormal glucose: Secondary | ICD-10-CM

## 2013-10-10 DIAGNOSIS — J449 Chronic obstructive pulmonary disease, unspecified: Secondary | ICD-10-CM

## 2013-10-10 NOTE — Assessment & Plan Note (Signed)
Stable control. Encouraged exercise, weight loss, healthy eating habits.  

## 2013-10-10 NOTE — Progress Notes (Signed)
HPI  I have personally reviewed the Medicare Annual Wellness questionnaire and have noted  1. The patient's medical and social history  2. Their use of alcohol, tobacco or illicit drugs  3. Their current medications and supplements  4. The patient's functional ability including ADL's, fall risks, home safety risks and hearing or visual  impairment.  5. Diet and physical activities  6. Evidence for depression or mood disorders  The patients weight, height, BMI and visual acuity have been recorded in the chart  I have made referrals, counseling and provided education to the patient based review of the above and I have provided the pt with a written personalized care plan for preventive services.   At last OV 06/2013: Acute meniscal tear of left knee -     Reinjury of acute meniscal tear during PT. Hold on PT until evaluated further... Refer back to ortho.  She states repeat injection helped temporarily. No a surgical candidate.       Acute respiratory failure -    Resolved. No longer oxygen dependant per HomeHealth measurements. D/C oxygen.   No further respiratory issues since then.        ABUSE, ALCOHOL, CONTINUOUS -  She has started back drinking but much less.  She states she has about 4 drinks a day (2 oz liquor each drink) She has stayed away from cigarettes.      Elevated Cholesterol: Well controlled on atorvastatin  Lab Results  Component Value Date   CHOL 209* 10/07/2013   HDL 77.30 10/07/2013   LDLCALC 61 11/12/2009   LDLDIRECT 119.9 10/07/2013   TRIG 126.0 10/07/2013   CHOLHDL 3 10/07/2013  Diet compliance: Working on healthy eating  Exercise:walking some daily  Other complaints:    Hypertension: Moderate control today.  No longer on any med. BP Readings from Last 3 Encounters:  10/10/13 140/80  07/08/13 114/76  06/24/13 100/80  Using medication without problems or lightheadedness: NOne  Chest pain with exertion: None  Edema:None  Short of breath:Yes.Marland Kitchen COPD   Average home BPs: not chekcing. Other issues:   Potassium low again, low calcium and low b12.  COPD, moderate control on spiriva daily, not using advair. Some SOB with exertion, daily cough.  Not requiring rescue inhaler.  Review of Systems  Constitutional: Negative for fever, fatigue and unexpected weight change.  HENT: Negative for ear pain, congestion, sore throat, sneezing, trouble swallowing and sinus pressure.  Eyes: Negative for pain and itching.  Respiratory: Positive for shortness of breath. Negative for cough and wheezing.  Cardiovascular: Negative for chest pain, palpitations and leg swelling.  Gastrointestinal: Positive for constipation. Negative for nausea, abdominal pain, diarrhea and blood in stool.  Genitourinary: Negative for dysuria, hematuria, vaginal discharge and difficulty urinating.  Skin: Negative for rash.  Neurological: Negative for syncope, weakness, light-headedness, numbness and headaches.  Psychiatric/Behavioral: Negative for confusion and dysphoric mood. The patient is not nervous/anxious.  Objective:   Physical Exam  Constitutional: Vital signs are normal. She appears well-developed and well-nourished. She is cooperative. Non-toxic appearance. She does not appear ill. No distress.  Obese.. telangectasias on abdomen and face  HENT:  Head: Normocephalic.  Right Ear: Hearing, tympanic membrane, external ear and ear canal normal.  Left Ear: Hearing, tympanic membrane, external ear and ear canal normal.  Nose: Nose normal.  Eyes: Conjunctivae, EOM and lids are normal. Pupils are equal, round, and reactive to light. No foreign bodies found.  Neck: Trachea normal and normal range of motion. Neck supple. Carotid  bruit is not present. No mass and no thyromegaly present.  Cardiovascular: Normal rate, regular rhythm, S1 normal, S2 normal, normal heart sounds and intact distal pulses. Exam reveals no gallop.  No murmur heard.  Pulmonary/Chest: Effort normal and  breath sounds normal. No respiratory distress. She has no wheezes. She has no rhonchi. She has no rales.  Abdominal: Soft. Normal appearance and bowel sounds are normal. She exhibits no distension, no fluid wave, no abdominal bruit and no mass. There is no hepatosplenomegaly. There is no tenderness. There is no rebound, no guarding and no CVA tenderness. No hernia.  Lymphadenopathy:  She has no cervical adenopathy.  She has no axillary adenopathy.  Neurological: She is alert. She has normal strength. No cranial nerve deficit or sensory deficit.  Skin: Skin is warm, dry and intact. No rash noted.  Psychiatric: Her speech is normal and behavior is normal. Judgment normal. Her mood appears not anxious. Cognition and memory are normal. She does not exhibit a depressed mood.  Assessment & Plan:   The patient's preventative maintenance and recommended screening tests for an annual wellness exam were reviewed in full today.  Brought up to date unless services declined.  Counselled on the importance of diet, exercise, and its role in overall health and mortality.  The patient's FH and SH was reviewed, including their home life, tobacco status, and drug and alcohol status.   Vaccines: uptodate with Td and flu, repeat PNA, considering shingles vaccine  Colon: Colonoscopy [facility]. Penelope Coop. 2006, repeat in 10 years.  Mammo: 02/2012 nml,due now. DEXA: Due for this.Marland Kitchen bt GYN has machine in his office.  PAP/DVE: TAH, not indicated pap or DVE. See GYN Dr. Phineas Real  Smoker.Dennie Maizes! CXR no masses 2014, moderate COPD on spirometry

## 2013-10-10 NOTE — Assessment & Plan Note (Signed)
Mild fluids overload. Pt can use lasix along with potassium prn fluid overload.

## 2013-10-10 NOTE — Assessment & Plan Note (Signed)
Restart vitamin supplements.

## 2013-10-10 NOTE — Progress Notes (Signed)
Pre-visit discussion using our clinic review tool. No additional management support is needed unless otherwise documented below in the visit note.  

## 2013-10-10 NOTE — Assessment & Plan Note (Signed)
Well controlled. Continue current medication.  

## 2013-10-10 NOTE — Patient Instructions (Addendum)
Follow BP at home, call if BP > 140/90.  Work on [employer], exercise, weight loss. Restart b12/b complex daily (folgard). Make sure to take both spiriva once daily and advair  twice daily as instructed. Call insurance to look into coverage of shingles vaccines.  Call to schedule mammogram on your own. Call to schedule appt with GYN for CPX/DEXA. High potassium foods.  Hypokalemia Hypokalemia means that the amount of potassium in the blood is lower than normal.Potassium is a chemical, called an electrolyte, that helps regulate the amount of fluid in the body. It also stimulates muscle contraction and helps nerves function properly.Most of the body's potassium is inside of cells, and only a very small amount is in the blood. Because the amount in the blood is so small, minor changes can be life-threatening. CAUSES  Antibiotics.  Diarrhea or vomiting.  Using laxatives too much, which can cause diarrhea.  Chronic kidney disease.  Water pills (diuretics).  Eating disorders (bulimia).  Low magnesium level.  Sweating a lot. SIGNS AND SYMPTOMS  Weakness.  Constipation.  Fatigue.  Muscle cramps.  Mental confusion.  Skipped heartbeats or irregular heartbeat (palpitations).  Tingling or numbness. DIAGNOSIS  Your health care provider can diagnose hypokalemia with blood tests. In addition to checking your potassium level, your health care provider may also check other lab tests. TREATMENT Hypokalemia can be treated with potassium supplements taken by mouth or adjustments in your current medicines. If your potassium level is very low, you may need to get potassium through a vein (IV) and be monitored in the hospital. A diet high in potassium is also helpful. Foods high in potassium are:  Nuts, such as peanuts and pistachios.  Seeds, such as sunflower seeds and pumpkin seeds.  Peas, lentils, and lima beans.  Whole grain and bran cereals and breads.  Fresh fruit and  vegetables, such as apricots, avocado, bananas, cantaloupe, kiwi, oranges, tomatoes, asparagus, and potatoes.  Orange and tomato juices.  Red meats.  Fruit yogurt. HOME CARE INSTRUCTIONS  Take all medicines as prescribed by your health care provider.  Maintain a healthy diet by including nutritious food, such as fruits, vegetables, nuts, whole grains, and lean meats.  If you are taking a laxative, be sure to follow the directions on the label. SEEK MEDICAL CARE IF:  Your weakness gets worse.  You feel your heart pounding or racing.  You are vomiting or having diarrhea.  You are diabetic and having trouble keeping your blood glucose in the normal range. SEEK IMMEDIATE MEDICAL CARE IF:  You have chest pain, shortness of breath, or dizziness.  You are vomiting or having diarrhea for more than 2 days.  You faint. MAKE SURE YOU:   Understand these instructions.  Will watch your condition.  Will get help right away if you are not doing well or get worse. Document Released: 09/11/2005 Document Revised: 07/02/2013 Document Reviewed: 03/14/2013 [facility] Patient Information 2014 [place].   Can use lasix occ as needed for peripheral swelling. Make sure to take 20 MEQ potassium at the same time.

## 2013-10-10 NOTE — Addendum Note (Signed)
Addended by: Carter Kitten on: 10/10/2013 03:00 PM   Modules accepted: Orders

## 2013-10-10 NOTE — Assessment & Plan Note (Signed)
Borderline elevation of med. ( stopped in hospital due to low BP during resp failure).  Follow BPs at home . May need to restart amlodipine daily.

## 2013-10-10 NOTE — Assessment & Plan Note (Signed)
Info given on high potassium foods.

## 2013-10-10 NOTE — Assessment & Plan Note (Signed)
Make sure to take spiriva AND advair daily.  Re-eval with spirometry in 6 months at follow up appt.

## 2013-10-11 ENCOUNTER — Telehealth: Payer: Self-pay | Admitting: Family Medicine

## 2013-10-11 NOTE — Telephone Encounter (Signed)
Relevant patient education assigned to patient using Emmi. ° °

## 2013-12-10 ENCOUNTER — Other Ambulatory Visit: Payer: Self-pay

## 2013-12-10 MED ORDER — FOLIC ACID-VIT B6-VIT B12 2.2-25-1 MG PO TABS: 1.0000 | ORAL_TABLET | Freq: Every day | ORAL | Status: DC

## 2013-12-10 NOTE — Telephone Encounter (Signed)
Pt request refill folgard to [facility]; advised done.

## 2014-07-01 ENCOUNTER — Ambulatory Visit (INDEPENDENT_AMBULATORY_CARE_PROVIDER_SITE_OTHER): Payer: Medicare HMO

## 2014-07-01 DIAGNOSIS — Z23 Encounter for immunization: Secondary | ICD-10-CM

## 2014-08-10 ENCOUNTER — Other Ambulatory Visit: Payer: Self-pay

## 2014-08-10 DIAGNOSIS — Z1231 Encounter for screening mammogram for malignant neoplasm of breast: Secondary | ICD-10-CM

## 2014-08-14 ENCOUNTER — Encounter: Payer: Self-pay | Admitting: Family Medicine

## 2014-08-19 ENCOUNTER — Encounter: Payer: Self-pay | Admitting: Family Medicine

## 2014-08-22 ENCOUNTER — Other Ambulatory Visit: Payer: Self-pay | Admitting: Family Medicine

## 2014-09-03 ENCOUNTER — Ambulatory Visit: Payer: Medicare HMO

## 2015-01-15 NOTE — H&P (Signed)
PATIENT NAME:  Melanie Schneider, Melanie Schneider MR#:  [id number] DATE OF BIRTH:  Nov 08, 1945  DATE OF ADMISSION:  06/10/2013  CHIEF COMPLAINTS:  1.  Altered mental status and shortness of breath.  2.  Slurred speech.   PRIMARY CARE PHYSICIAN: Amy Bedsole from the [facility].   HISTORY OF PRESENT ILLNESS: This is a very nice 69 year old female who has a history of multiple medical problems including COPD, neuropathy, tremors, knee pain, osteoarthritis. Apparently she should be taking medications for blood pressure and maybe some for her heart, but the patient is not compliant with treatment.   She drinks about pint of vodka a day, and she smokes.   The patient comes today with a history of not feeling very well. The husband noticed that she had slurred speech and she was confused last night. She woke up at 1:30 a.m. very confused, trying to get up. The husband helped her go back to sleep. At 3:30 a.m. she woke up again, even more confused, and mumbling some words. She had unintelligible speech, or slurred speech, and apparently that has been going on for a couple of days as well.   The husband states that she could not understand what she was saying, that she was talking like she was drunk.  This morning when she came over she was hypoxic. Her lips were blue, and she was very confused. She has had some hallucinations. Pulse oximetry symmetry was read as a 38 from the  EMS, but here after we got a good wave-form it was in the 70s.   The patient was put on a non-rebreather, and her oxygen sats got worse. She got more confused, for which she needed to be put on BiPAP.   The patient at this moment is still on the BiPAP, but she is much more awake.   Her pCO2 whenever she arrived was 78, and her PO2 was 63.   I had a long discussion with the husband as far as her care. He said that she does not take good care of herself, and does not take her medications.   The patient is admitted for treatment of this  condition. The chest x-ray showed bilateral pulmonary infiltrates and severe cardiomegaly, for which it is likely the patient has CHF, and she has been put on Lasix as well.   REVIEW OF SYSTEMS: 12-review of systems review of systems:  CONSTITUTIONAL: No fever. No fatigue. No weakness. No weight loss or weight gain that she can tell.  EYES: No blurry vision, double vision.  EARS, NOSE, THROAT: No difficulty swallowing. No tinnitus.  RESPIRATORY: Positive cough. Positive wheezing. Negative hemoptysis. Positive dyspnea. No painful respirations.  CARDIOVASCULAR: No chest pain, orthopnea or syncope.  GASTROINTESTINAL: No nausea, vomiting, abdominal pain, constipation, or diarrhea.  GENITOURINARY: No dysuria or hematuria.  ENDOCRINE: No polyuria, polydipsia, polyphagia, cold or heat intolerance.  GYNECOLOGIC: No breast masses.  HEMATOLOGIC AND LYMPHATIC: No anemia, easy bruising or swollen glands.  SKIN: No rashes, petechiae or new lesions.  NEUROLOGIC: No numbness, tingling, CVAs or TIAs.  PSYCHIATRIC: No significant agitation.  MUSCULOSKELETAL: No joint effusions or gout.   PAST MEDICAL HISTORY: 1.  COPD.  2.  Peripheral neuropathy.  3.  Osteoarthritis.  4.  Severe knee pain.  5.  Tremors.  6.  Hypertension.  7.  Borderline diabetes.   Again, the patient's husband states that the patient should be taking a lot of medications that she has been refusing to take for years.   ALLERGIES:  IODINE AND LATEX.   PAST SURGICAL HISTORY:  1.  Tonsillectomy.  2.  Hysterectomy.  3.  Wisdom teeth removal.   FAMILY HISTORY: Positive for a MI in both parents. Unknown what age they happened.  Positive for cancer in multiple members of the family in multiple places including gastrointestinal, lungs.   SOCIAL HISTORY: The patient smokes one-quarter of a pack per day, and she has been smoking since her teenage years, for over 69 years. She drinks about a pint vodka a day. She is retired. 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 She is married.   MEDICATIONS: The only medication that she is currently taking is meloxicam, 15 mg once daily.   PHYSICAL EXAMINATION: VITAL SIGNS: Blood pressure 138/73, pulse 90, respirations 22, temperature 98.4, oxygen saturation around 70% on room air.  HEENT: Her pupils are equal and reactive. Extraocular movements are intact. Mucosa are moist. The patient is on BiPAP. She is hemodynamically stable, but she has moderately severe respiratory distress.  NECK: Supple. No JVD. No thyromegaly. No adenopathy. No carotid bruits.  CARDIOVASCULAR: Regular rate and rhythm. No murmurs, rubs or gallops are appreciated at this moment. Positive displacement of PMI to anterior axillary line. No tenderness to palpation anterior, chest wall.  LUNGS: Hearing some wheezing and rhonchi, diffuse, in both respiratory fields. Positive use of accessory muscles. The patient is currently on BiPAP. Starting to feel a little bit better.  ABDOMEN: Obese. No tenderness. No distention. No hepatosplenomegaly. No masses. GENITAL: Negative for external lesions.   EXTREMITIES: No edema. No cyanosis or clubbing. Positive trace edema.  VASCULAR: Pulses +2. Capillary refill less than 3.  NEUROLOGIC: Cranial nerves II-XII intact. Strength is 5/5 in all 4 extremities. DTRs +2.  PSYCHIATRIC: Negative for agitation. The patient has good insight at this moment. The patient was confused earlier, but now she is clearing up. She is alert, oriented x 3.  MUSCULOSKELETAL: No significant joint effusions or joint swelling.  SKIN: No rashes or petechiae.   RESULTS: CHEST X-RAY: As mentioned above, the patient had bilateral atelectasis versus infiltrate, and she also has fluid cephalization.   Her BNP is 5000, glucose 112, creatinine 0.49. Electrolytes were within normal limits, with a potassium of 4.3 and a serum sodium of 143.   LFTs are normal.   Troponin is 0.04.   White count is  6.9, hemoglobin is 15. Platelet count is 219, INR 1.1. White count is 1 on the urine. No signs of urinary infection. PH is 7.33, PAO2 78, pO2 63, HCO of 41.   ASSESSMENT AND PLAN: This is a very nice 69 year old female with a history of chronic obstructive pulmonary disease, neuropathy, osteoarthritis, tremors, hypertension, borderline diabetes who comes with acute hypercarbic and hypoxic respiratory failure.   Hypercarbic respiratory failure: The patient has this problem secondary to multiple issues:   (a) Chronic obstructive pulmonary disease.  (b) Drinking.  (c) Smoking.   The patient has an exacerbation of her chronic obstructive pulmonary disease.   She has been put on BiPAP, and it is working. I think that the patient does not require too much oxygen because of the patient getting more confused and more hypoxic whenever she was on a non-rebreather mask. Likely the patient had some decrease of respiratory drive secondary to increase of oxygen.   The patient is due to continue on the BiPAP. We are going to do an ABG later on today to try to remove her BiPAP.   Steroids,  nebulizers, and antibiotics: The patient is going to be on levofloxacin for her chronic obstructive pulmonary disease exacerbation.   Congestive heart failure: The patient does not have a formal diagnosis of congestive heart failure. We do not know if it is diastolic or systolic. The patient has an enlarged heart. She is supposed to be taking blood pressure medications but she is not, for which we are going to get an echocardiogram, start her on Lasix, and start her on a low-dose beta blocker.   The patient has a chronic obstructive pulmonary disease exacerbation, for which a low-dose beta blocker will be good as it will potentiate the beta receptors.   Consider adding an ARB or ACE inhibitor if her ejection fraction is low.   At this moment there are no signs of pneumonia, but the patient is going to be taking antibiotics  for her chronic obstructive pulmonary disease exacerbation.   Neuropathy: Continue to observe at this moment. No need for pain medication. Has tremor. The patient has been told that she could have Parkinson's. At this moment she does not have any tremor whatsoever. We will continue to monitor.   Hypertension: Start her on metoprolol. Consider adding on an ACE inhibitor. We are going to do p.r.n. hydralazine for systolic blood pressures above 160.   Abnormal electrocardiogram: The patient has some ST inversions on septal leads. Maybe she had a previous myocardial infarction. At this moment we are going to do serial troponins and we are going to start her on aspirin 325 mg daily. We are going to check her lipid profile and once we have the results of the echo we are going to consult cardiology for help on evaluation of the problem.   Smoker: The patient is a smoker. Smoking cessation counseling given to the patient for over  4 minutes. The patient is a drinker and drinking cessation also given for the patient for over  3 minutes. The patient is going to be on the CIWA protocol.   The patient is critically ill, with decreased oxygen saturations, altered mental status, respiratory failure. We are going to put her on the bipap for now and transfer to the CCU for now. The patient at high risk of cardiovascular collapse and complications.   I spent critical care time of 55 minutes.    ____________________________ Kopperston Sink, MD rsg:dm D: 06/10/2013 13:11:28 ET T: 06/10/2013 14:04:29 ET JOB#: [id number]  cc: Montgomery Sink, MD, <Dictator> Jacey Eckerson America Brown MD ELECTRONICALLY SIGNED 06/19/2013 11:03

## 2015-01-15 NOTE — Discharge Summary (Signed)
PATIENT NAME:  Melanie Schneider, DIENER MR#:  [id number] DATE OF BIRTH:  [birth year]  DISCHARGE DIAGNOSES:  1.  Acute respiratory failure.  2.  Congestive heart failure.  3.  Acute diastolic blood aspiration.  4.  Pneumonitis.  5.  Alcohol withdrawal.  6.  Hypertension.  7.  Generalized weakness.   CONDITION ON DISCHARGE: Stable.   CODE STATUS: FULL CODE.   DISCHARGE MEDICATIONS:  1.  Prednisone 10 mg, start at 60 mg and taper 10 mg daily until complete.  2.  Spiriva 1 capsule inhalation once a day.  3.  Proventil 2 puffs inhalation four times a day as needed for shortness of breath.  4.  Furosemide 20 mg oral tablet once a day.  5.  Oxygen delivery at home, 2 liters nasal cannula supplementation.   DIET ON DISCHARGE: Low sodium. Diet consistency: Regular.   ACTIVITY: Advise as tolerated.   TIMEFRAME TO FOLLOW-UP: Within 1 to 2 weeks with Eliezer Lofts, MD.   HISTORY OF PRESENT ILLNESS: As per H and P done by Dr. Laurin Coder on September 16. A 69 year old female with multiple medical problems, COPD, and neuropathy, tremor, knee pain, taking medication for blood pressure, who came to the Emergency Room not feeling well. Her husband noticed that she had slurred speech and she was confused the previous night and woke up at 1:30 in the morning very confused, trying to get up. At 3:30, she woke up again even more confused and mumbling some words, had unintelligible speech and slurred speech that had been going on for a few days as well. She was hypoxic when she came to the Emergency Room. Her lips were blue and very confused with hallucination. Pulse oximetry was 38 from EMS and came up to 70. She was put on nonrebreather mask. She became more confused and so needed to be put on BiPAP. After BiPAP, she was much more awake and alert. Her pCO2 on arrival was 78 and PO2 was 63.   HOSPITAL COURSE AND STAY:  1.  Initially the patient remained lethargic because of acute hypoxic and hypercapnic respiratory  failure due to COPD and possible CHF with aspiration pneumonitis. Continued BiPAP and started on treatment for COPD with steroid and nebulizer treatment. Started on Lasix but then the patient was not taking enough oral diet because of altered mental status, so cut down Lasix, and over a period of time, the patient had slow but progressive recovery and was already tolerating nasal cannula oxygen supplementation.  2.  On further questioning the family, we found that patient was a heavy drinker and then we thought about possible aspiration due to her drinking habit. We also kept her on CIWA protocol while in the hospital. There were some withdrawal signs initially but which resolved during her hospital stay of almost a week.  3.  Bradycardia. The patient had some bradycardia but she was on beta blockers. We held it and then heart rate remained under control and was proper.  4.  Hypertension. Hydralazine continued as needed.   IMPORTANT LAB RESULTS IN THE HOSPITAL: Blood culture on admission was negative. WBC was 6.9, hemoglobin was 15 and platelet was 209 with MCV 106. Creatinine was 0.49. Potassium was 4.1 on admission. Troponin was 0.04. ABG showed pH of 7.33, pCO2 was 78, and pO2 was 63 on 36% FiO2 on nasal cannula on admission.   Chest x-ray, portable, single view, on admission showed cardiomegaly, minimal right base atelectasis or infiltrates.   Urine culture showed no growth.  Echocardiogram was done in the hospital which showed ejection fraction 50% to 55%, mildly increased left ventricular internal cavity size, impaired relaxation revealed diastolic filling.   Magnesium was 1.5 which was replaced. Troponin remained negative 0.03 on follow-up.   TOTAL TIME SPENT ON THIS DISCHARGE: 40 minutes.    ____________________________ Ceasar Lund Anselm Jungling, MD vgv:[id number] D: 06/18/2013 14:29:58 ET T: 06/18/2013 17:37:06 ET JOB#: [id number]  cc: Ceasar Lund. Anselm Jungling, MD, <Dictator> Vaughan Basta MD ELECTRONICALLY SIGNED 06/20/2013 18:35

## 2015-02-04 ENCOUNTER — Ambulatory Visit (INDEPENDENT_AMBULATORY_CARE_PROVIDER_SITE_OTHER): Payer: PPO | Admitting: Primary Care

## 2015-02-04 ENCOUNTER — Encounter: Payer: Self-pay | Admitting: Primary Care

## 2015-02-04 VITALS — BP 128/82 | HR 71 | Temp 98.0°F | Ht 63.5 in | Wt 220.8 lb

## 2015-02-04 DIAGNOSIS — R21 Rash and other nonspecific skin eruption: Secondary | ICD-10-CM

## 2015-02-04 DIAGNOSIS — L03115 Cellulitis of right lower limb: Secondary | ICD-10-CM | POA: Diagnosis not present

## 2015-02-04 MED ORDER — PERMETHRIN 5 % EX CREA: TOPICAL_CREAM | CUTANEOUS | Status: DC

## 2015-02-04 MED ORDER — DOXYCYCLINE HYCLATE 100 MG PO TABS: 100.0000 mg | ORAL_TABLET | Freq: Two times a day (BID) | ORAL | Status: DC

## 2015-02-04 NOTE — Patient Instructions (Signed)
Apply Permethrin cream once. Start at your head/neck and apply all over body down to soles of your feet. Do not rub on your wound. Leave this on for 8 hours before washing off. Start Doxycycline tablets for wound. Take 1 tablet by mouth twice daily for 10 days. Follow up in 1 week for re-evaluation.  Cellulitis Cellulitis is an infection of the skin and the tissue beneath it. The infected area is usually red and tender. Cellulitis occurs most often in the arms and lower legs.  CAUSES  Cellulitis is caused by bacteria that enter the skin through cracks or cuts in the skin. The most common types of bacteria that cause cellulitis are staphylococci and streptococci. SIGNS AND SYMPTOMS   Redness and warmth.  Swelling.  Tenderness or pain.  Fever. DIAGNOSIS  Your health care provider can usually determine what is wrong based on a physical exam. Blood tests may also be done. TREATMENT  Treatment usually involves taking an antibiotic medicine. HOME CARE INSTRUCTIONS   Take your antibiotic medicine as directed by your health care provider. Finish the antibiotic even if you start to feel better.  Keep the infected arm or leg elevated to reduce swelling.  Apply a warm cloth to the affected area up to 4 times per day to relieve pain.  Take medicines only as directed by your health care provider.  Keep all follow-up visits as directed by your health care provider. SEEK MEDICAL CARE IF:   You notice red streaks coming from the infected area.  Your red area gets larger or turns dark in color.  Your bone or joint underneath the infected area becomes painful after the skin has healed.  Your infection returns in the same area or another area.  You notice a swollen bump in the infected area.  You develop new symptoms.  You have a fever. SEEK IMMEDIATE MEDICAL CARE IF:   You feel very sleepy.  You develop vomiting or diarrhea.  You have a general ill feeling (malaise) with muscle  aches and pains. MAKE SURE YOU:   Understand these instructions.  Will watch your condition.  Will get help right away if you are not doing well or get worse. Document Released: 06/21/2005 Document Revised: 01/26/2014 Document Reviewed: 11/27/2011 [facility] Patient Information 2015 [place], [place]. This information is not intended to replace advice given to you by your health care provider. Make sure you discuss any questions you have with your health care provider.

## 2015-02-04 NOTE — Progress Notes (Signed)
Pre visit review using our clinic review tool, if applicable. No additional management support is needed unless otherwise documented below in the visit note. 
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
## 2015-02-10 ENCOUNTER — Telehealth: Payer: Self-pay

## 2015-02-10 NOTE — Telephone Encounter (Signed)
Pt was seen 02/04/15 with Allie Bossier NP; pt thought had f/u appt on 02/11/15 with Allie Bossier NP and pt wanted appt with Dr Diona Browner; advised pt the appt already scheduled on 02/11/15 at 12 noon is with Dr Diona Browner. Pt said she wants to keep that appt; pt rash that started 2 weeks ago and cellulitis is no better; rash is now on legs as well and pt does not see improvement in weeping of leg. Pt does not want appt today with another provider; pt only wants to see Dr Diona Browner. Advised pt if condition changes or worsens prior to appt to call back and pt voiced understanding.

## 2015-02-11 ENCOUNTER — Ambulatory Visit (INDEPENDENT_AMBULATORY_CARE_PROVIDER_SITE_OTHER): Payer: PPO | Admitting: Family Medicine

## 2015-02-11 ENCOUNTER — Encounter: Payer: Self-pay | Admitting: Family Medicine

## 2015-02-11 VITALS — BP 104/60 | HR 69 | Temp 97.7°F | Ht 63.5 in | Wt 224.5 lb

## 2015-02-11 DIAGNOSIS — L97811 Non-pressure chronic ulcer of other part of right lower leg limited to breakdown of skin: Secondary | ICD-10-CM | POA: Diagnosis not present

## 2015-02-11 DIAGNOSIS — R21 Rash and other nonspecific skin eruption: Secondary | ICD-10-CM | POA: Diagnosis not present

## 2015-02-11 DIAGNOSIS — L97819 Non-pressure chronic ulcer of other part of right lower leg with unspecified severity: Secondary | ICD-10-CM | POA: Insufficient documentation

## 2015-02-11 MED ORDER — TRIAMCINOLONE ACETONIDE 0.5 % EX CREA: TOPICAL_CREAM | CUTANEOUS | Status: DC

## 2015-02-11 NOTE — Telephone Encounter (Signed)
Noted  

## 2015-02-11 NOTE — Progress Notes (Signed)
   Subjective:    Patient ID: Melanie Schneider, female    DOB: 08-02-1946, 69 y.o.   MRN: [id number]  HPI  69 year old female with history of peripheral neuropathy severe presents for follow up of cellulitis/ ulcer in right medial ankle . She was seen on  5/12 and started on doxy x 10 days.    Also felt at that OV to have scabies or bedbug infection. Treated with permethrin cream.  Today she reports left medial ankle wound is no worse but no better.  She now has area starting on left ankle anteriorly. She is mild tender in wound, burning. Clear yellow discharge.  No fever.  The rash associate with scabies is no different with permethrin cream. ON back , arms, legs.. No in groin,  On area on buttock not between fingers or in armpits. Lesions are very ithcy.  No family members with itching.  No new exposure ( does have new mattress, cleaned chemically) , no new med.  Also has noted  Fullness in left ear off and on for 6 months, mild pain. No disscharge.   Review of Systems  All other systems reviewed and are negative.      Objective:   Physical Exam  Constitutional: Vital signs are normal. She appears well-developed and well-nourished. She is cooperative.  Non-toxic appearance. She does not appear ill. No distress.  Central obesity  HENT:  Head: Normocephalic.  Right Ear: Hearing, tympanic membrane, external ear and ear canal normal. No tenderness. Tympanic membrane is not injected, not erythematous, not retracted and not bulging. No middle ear effusion.  Left Ear: Hearing, tympanic membrane, external ear and ear canal normal. No tenderness. Tympanic membrane is not injected, not erythematous, not retracted and not bulging.  No middle ear effusion.  Nose: No mucosal edema or rhinorrhea. Right sinus exhibits no maxillary sinus tenderness and no frontal sinus tenderness. Left sinus exhibits no maxillary sinus tenderness and no frontal sinus tenderness.  Mouth/Throat: Uvula is  midline, oropharynx is clear and moist and mucous membranes are normal.  No cerumen, no current pain  Eyes: Conjunctivae, EOM and lids are normal. Pupils are equal, round, and reactive to light. Lids are everted and swept, no foreign bodies found.  Neck: Trachea normal and normal range of motion. Neck supple. Carotid bruit is not present. No thyroid mass and no thyromegaly present.  Cardiovascular: Normal rate, regular rhythm, S1 normal, S2 normal, normal heart sounds, intact distal pulses and normal pulses.  Exam reveals no gallop and no friction rub.   No murmur heard. Pulmonary/Chest: Effort normal and breath sounds normal. No tachypnea. No respiratory distress. She has no decreased breath sounds. She has no wheezes. She has no rhonchi. She has no rales.  Abdominal: Soft. Normal appearance and bowel sounds are normal. There is no tenderness.  Neurological: She is alert.  Skin: Skin is warm, dry and intact. No rash noted.  Multiple erythematous dry scaly macules and papules on torso and arms, larger dry patch on left buttock 1 cm and 0.5 cm lesion on left anterior ankle. Ulcer/lesion on right medical ankle, with slight erythema, no increase warmth, no discharge, no odor      Psychiatric: Her speech is normal and behavior is normal. Judgment and thought content normal. Her mood appears not anxious. Cognition and memory are normal. She does not exhibit a depressed mood.          Assessment & Plan:

## 2015-02-11 NOTE — Patient Instructions (Signed)
Apply compounded cream to skin twice daily. Follow up in 1 week, if better call.

## 2015-02-11 NOTE — Progress Notes (Signed)
Pre visit review using our clinic review tool, if applicable. No additional management support is needed unless otherwise documented below in the visit note. 

## 2015-02-11 NOTE — Assessment & Plan Note (Signed)
No current infection seen, complete antibiotic course.  Most likely connected to  Rash on rest of body and worse given severe neuropathy in extremities.

## 2015-02-11 NOTE — Assessment & Plan Note (Signed)
At this point no improvement with permethrin treatment . Husband who sleeps in same bed with pt as well as no rash between fingers and in groin and armpits etc... Make scabies less likely. Recommend no further permethrin treatment.  Appears more consistent with contact derm at this point. Will treat with topical steroid. Follow up in 1 week.  If not improving refer to derm.

## 2015-02-19 ENCOUNTER — Ambulatory Visit: Payer: PPO | Admitting: Family Medicine

## 2015-06-25 ENCOUNTER — Encounter: Payer: Self-pay | Admitting: Family Medicine

## 2015-06-25 ENCOUNTER — Ambulatory Visit (INDEPENDENT_AMBULATORY_CARE_PROVIDER_SITE_OTHER): Payer: PPO | Admitting: Family Medicine

## 2015-06-25 VITALS — BP 96/56 | HR 60 | Temp 98.5°F | Ht 63.5 in | Wt 225.5 lb

## 2015-06-25 DIAGNOSIS — I5032 Chronic diastolic (congestive) heart failure: Secondary | ICD-10-CM | POA: Diagnosis not present

## 2015-06-25 DIAGNOSIS — E7800 Pure hypercholesterolemia, unspecified: Secondary | ICD-10-CM

## 2015-06-25 DIAGNOSIS — F1010 Alcohol abuse, uncomplicated: Secondary | ICD-10-CM

## 2015-06-25 DIAGNOSIS — F1011 Alcohol abuse, in remission: Secondary | ICD-10-CM

## 2015-06-25 DIAGNOSIS — Z23 Encounter for immunization: Secondary | ICD-10-CM

## 2015-06-25 DIAGNOSIS — J449 Chronic obstructive pulmonary disease, unspecified: Secondary | ICD-10-CM | POA: Diagnosis not present

## 2015-06-25 DIAGNOSIS — I10 Essential (primary) hypertension: Secondary | ICD-10-CM

## 2015-06-25 DIAGNOSIS — R21 Rash and other nonspecific skin eruption: Secondary | ICD-10-CM | POA: Diagnosis not present

## 2015-06-25 MED ORDER — BETAMETHASONE DIPROPIONATE 0.05 % EX CREA: TOPICAL_CREAM | Freq: Two times a day (BID) | CUTANEOUS | Status: DC

## 2015-06-25 NOTE — Assessment & Plan Note (Signed)
Most consistent with atopic dermatitis.  Treat with topical steroid.. Increase potency twice daily x 2 weeks, if not improving will refer to [facility]

## 2015-06-25 NOTE — Assessment & Plan Note (Signed)
Due for re-eval. Check labs prior to [facility] in 09/2015

## 2015-06-25 NOTE — Assessment & Plan Note (Signed)
Pt congratulated on stopping ETOH.

## 2015-06-25 NOTE — Assessment & Plan Note (Signed)
BP low normal on no med. Occ lightheadedness.  Follow BPs at home.

## 2015-06-25 NOTE — Assessment & Plan Note (Signed)
Stable on advair.  

## 2015-06-25 NOTE — Assessment & Plan Note (Signed)
Stable, euvolemic. 

## 2015-06-25 NOTE — Patient Instructions (Addendum)
Follow blood pressure, HR at home. Call with the results in next few weeks. Increase water intake.  Increase fiber foods in diet for constipation. Start new steroid cream twice daily on all areas except nipples. On nipples use triamcinolone that you already have.

## 2015-06-25 NOTE — Progress Notes (Signed)
Subjective:    Patient ID: Melanie Schneider, female    DOB: 1946-02-05, 69 y.o.   MRN: [id number]  HPI   69 year old female presents for follow up skin eval of multiple concerning lesions.  She reports that she has multiple dry patches on  Legs, buttocks, dry spot on nipples. Very itchy.  She occ is using triamcinolone eucerin daily .Marland Kitchen Expensive. No new exposures. No new meds.    Hypertension:  Low normal on no medication now that she has stopped ETOH. BP Readings from Last 3 Encounters:  06/25/15 96/56  02/11/15 104/60  02/04/15 128/82  Using medication without problems or lightheadedness: occ Chest pain with exertion: None Edema:None Short of breath: occ Average home BPs: Not checking at home. Other issues: no bleeding , no emesis, no diarrhea  Wt Readings from Last 3 Encounters:  06/25/15 225 lb 8 oz (102.286 kg)  02/11/15 224 lb 8 oz (101.833 kg)  02/04/15 220 lb 12.8 oz (100.154 kg)   She has history of chronic diastolic heart failure.  Stable fluid status on lasix as needed for edema. She has not needed it since she has stopped drinking ETOH in last year.  Dx on ECHO in 2014:  nml EF but impaired diastolic filling. HX of TIA  Constipation.. 2 BMs a week.  Eating some fiber.  Cholesterol at goal < 130 last check.Marland Kitchen Re-eval at [facility] in 09/2015 Lab Results  Component Value Date   CHOL 209* 10/07/2013   HDL 77.30 10/07/2013   LDLCALC 80 06/10/2013   LDLDIRECT 119.9 10/07/2013   TRIG 126.0 10/07/2013   CHOLHDL 3 10/07/2013   COPD, moderate severe: Stable on advair  Social History /Family History/Past Medical History reviewed and updated if needed. Had similar rash in past, many years ago 1980s, seen at [facility] dermatologist (had biopsy)... Felt due to stress or ETOH.  Review of Systems  Constitutional: Negative for fever and fatigue.  HENT: Negative for ear pain.   Eyes: Negative for pain.  Respiratory: Negative for chest tightness.   Cardiovascular:  Negative for chest pain, palpitations and leg swelling.  Gastrointestinal: Negative for abdominal pain.  Genitourinary: Negative for dysuria.       Objective:   Physical Exam  Constitutional: Vital signs are normal. She appears well-developed and well-nourished. She is cooperative.  Non-toxic appearance. She does not appear ill. No distress.  Central obesity  HENT:  Head: Normocephalic.  Right Ear: Hearing, tympanic membrane, external ear and ear canal normal. No tenderness. Tympanic membrane is not injected, not erythematous, not retracted and not bulging. No middle ear effusion.  Left Ear: Hearing, tympanic membrane, external ear and ear canal normal. No tenderness. Tympanic membrane is not injected, not erythematous, not retracted and not bulging.  No middle ear effusion.  Nose: No mucosal edema or rhinorrhea. Right sinus exhibits no maxillary sinus tenderness and no frontal sinus tenderness. Left sinus exhibits no maxillary sinus tenderness and no frontal sinus tenderness.  Mouth/Throat: Uvula is midline, oropharynx is clear and moist and mucous membranes are normal.  Eyes: Conjunctivae, EOM and lids are normal. Pupils are equal, round, and reactive to light. Lids are everted and swept, no foreign bodies found.  Neck: Trachea normal and normal range of motion. Neck supple. Carotid bruit is not present. No thyroid mass and no thyromegaly present.  Cardiovascular: Normal rate, regular rhythm, S1 normal, S2 normal, normal heart sounds, intact distal pulses and normal pulses.  Exam reveals no gallop and no friction rub.  No murmur heard.  Varicosities in ankles and legs  no edema  Pulmonary/Chest: Effort normal and breath sounds normal. No tachypnea. No respiratory distress. She has no decreased breath sounds. She has no wheezes. She has no rhonchi. She has no rales.  Abdominal: Soft. Normal appearance and bowel sounds are normal. There is no tenderness.  Neurological: She is alert.  Skin:  Skin is warm, dry and intact. No rash noted.  Multiple erythematous dry scaly macules and papules on knees, ankles, buttocks and bilateral nipples      Psychiatric: Her speech is normal and behavior is normal. Judgment and thought content normal. Her mood appears not anxious. Cognition and memory are normal. She does not exhibit a depressed mood.          Assessment & Plan:

## 2015-06-25 NOTE — Progress Notes (Signed)
Pre visit review using our clinic review tool, if applicable. No additional management support is needed unless otherwise documented below in the visit note. 

## 2015-07-28 ENCOUNTER — Telehealth: Payer: Self-pay

## 2015-07-28 NOTE — Telephone Encounter (Signed)
Amy with [facility] said pt is requesting refill ProAir; not on med list, last annual 09/30/13 and then pt was to schedule 6 mth f/u with spirometry. Is it OK for pt to get ProAir. Pt h as med wellness scheduled 09/2015.

## 2015-07-29 MED ORDER — ALBUTEROL SULFATE HFA 108 (90 BASE) MCG/ACT IN AERS: 2.0000 | INHALATION_SPRAY | Freq: Four times a day (QID) | RESPIRATORY_TRACT | Status: DC | PRN

## 2015-07-29 NOTE — Telephone Encounter (Signed)
Okay to fill? 

## 2015-08-25 ENCOUNTER — Encounter: Payer: Self-pay | Admitting: Family Medicine

## 2015-08-26 ENCOUNTER — Encounter: Payer: Self-pay | Admitting: Family Medicine

## 2015-09-30 ENCOUNTER — Telehealth: Payer: Self-pay | Admitting: Family Medicine

## 2015-09-30 ENCOUNTER — Other Ambulatory Visit: Payer: PPO

## 2015-09-30 DIAGNOSIS — Z1159 Encounter for screening for other viral diseases: Secondary | ICD-10-CM

## 2015-09-30 DIAGNOSIS — E538 Deficiency of other specified B group vitamins: Secondary | ICD-10-CM

## 2015-09-30 DIAGNOSIS — I10 Essential (primary) hypertension: Secondary | ICD-10-CM

## 2015-09-30 NOTE — Telephone Encounter (Signed)
-----   Message from Marchia Bond sent at 09/22/2015  2:27 PM EST ----- Regarding: cpx labs 1/5, need orders. Thanks :-) Please order  future cpx labs for pt's upcoming lab appt. Thanks Melanie Schneider

## 2015-10-07 ENCOUNTER — Ambulatory Visit (INDEPENDENT_AMBULATORY_CARE_PROVIDER_SITE_OTHER): Payer: PPO | Admitting: Family Medicine

## 2015-10-07 ENCOUNTER — Encounter: Payer: Self-pay | Admitting: Family Medicine

## 2015-10-07 ENCOUNTER — Other Ambulatory Visit: Payer: Self-pay | Admitting: Family Medicine

## 2015-10-07 VITALS — BP 117/73 | HR 58 | Temp 97.6°F | Ht 63.25 in | Wt 226.5 lb

## 2015-10-07 DIAGNOSIS — Z7189 Other specified counseling: Secondary | ICD-10-CM

## 2015-10-07 DIAGNOSIS — Z0000 Encounter for general adult medical examination without abnormal findings: Secondary | ICD-10-CM | POA: Diagnosis not present

## 2015-10-07 DIAGNOSIS — E348 Other specified endocrine disorders: Secondary | ICD-10-CM | POA: Diagnosis not present

## 2015-10-07 DIAGNOSIS — E538 Deficiency of other specified B group vitamins: Secondary | ICD-10-CM

## 2015-10-07 DIAGNOSIS — J449 Chronic obstructive pulmonary disease, unspecified: Secondary | ICD-10-CM | POA: Diagnosis not present

## 2015-10-07 DIAGNOSIS — K5900 Constipation, unspecified: Secondary | ICD-10-CM

## 2015-10-07 DIAGNOSIS — I10 Essential (primary) hypertension: Secondary | ICD-10-CM | POA: Diagnosis not present

## 2015-10-07 DIAGNOSIS — Z1159 Encounter for screening for other viral diseases: Secondary | ICD-10-CM | POA: Diagnosis not present

## 2015-10-07 DIAGNOSIS — K5909 Other constipation: Secondary | ICD-10-CM

## 2015-10-07 DIAGNOSIS — R21 Rash and other nonspecific skin eruption: Secondary | ICD-10-CM

## 2015-10-07 DIAGNOSIS — E7800 Pure hypercholesterolemia, unspecified: Secondary | ICD-10-CM

## 2015-10-07 LAB — COMPREHENSIVE METABOLIC PANEL
ALK PHOS: 75 U/L (ref 39–117)
ALT: 10 U/L (ref 0–35)
AST: 13 U/L (ref 0–37)
Albumin: 3.9 g/dL (ref 3.5–5.2)
BILIRUBIN TOTAL: 0.6 mg/dL (ref 0.2–1.2)
BUN: 14 mg/dL (ref 6–23)
CO2: 31 mEq/L (ref 19–32)
Calcium: 9.3 mg/dL (ref 8.4–10.5)
Chloride: 104 mEq/L (ref 96–112)
Creatinine, Ser: 0.54 mg/dL (ref 0.40–1.20)
GFR: 118.83 mL/min (ref >60.00)
GLUCOSE: 86 mg/dL (ref 70–99)
Potassium: 4.1 mEq/L (ref 3.5–5.1)
SODIUM: 139 meq/L (ref 135–145)
TOTAL PROTEIN: 6.7 g/dL (ref 6.0–8.3)

## 2015-10-07 LAB — LIPID PANEL
Cholesterol: 182 mg/dL (ref 0–200)
HDL: 36.7 mg/dL — ABNORMAL LOW (ref >39.00)
LDL Cholesterol: 128 mg/dL — ABNORMAL HIGH (ref 0–99)
NONHDL: 145.37
Total CHOL/HDL Ratio: 5
Triglycerides: 87 mg/dL (ref 0.0–149.0)
VLDL: 17.4 mg/dL (ref 0.0–40.0)

## 2015-10-07 LAB — VITAMIN B12: VITAMIN B 12: 165 pg/mL — AB (ref 211–911)

## 2015-10-07 MED ORDER — LINACLOTIDE 145 MCG PO CAPS: 145.0000 ug | ORAL_CAPSULE | Freq: Every day | ORAL | Status: DC

## 2015-10-07 NOTE — Progress Notes (Signed)
I have personally reviewed the Medicare Annual Wellness questionnaire and have noted 1. The patient's medical and social history 2. Their use of alcohol, tobacco or illicit drugs 3. Their current medications and supplements 4. The patient's functional ability including ADL's, fall risks, home safety risks and hearing or visual             impairment. 5. Diet and physical activities 6. Evidence for depression or mood disorders 7.         Updated provider list Cognitive evaluation was performed and recorded on pt medicare questionnaire form. The patients weight, height, BMI and visual acuity have been recorded in the chart  I have made referrals, counseling and provided education to the patient based review of the above and I have provided the pt with a written personalized care plan for preventive services.      Elevated Cholesterol: Due for re-eval  on atorvastatin  Lab Results  Component Value Date   CHOL 209* 10/07/2013   HDL 77.30 10/07/2013   LDLCALC 80 06/10/2013   LDLDIRECT 119.9 10/07/2013   TRIG 126.0 10/07/2013   CHOLHDL 3 10/07/2013   Diet compliance: Working on healthy eating  Exercise:walking some in store, but she is unsteady, using a cane Other complaints:   Wt Readings from Last 3 Encounters:  10/07/15 226 lb 8 oz (102.74 kg)  06/25/15 225 lb 8 oz (102.286 kg)  02/11/15 224 lb 8 oz (101.833 kg)     Hypertension: well controlle d BP Readings from Last 3 Encounters:  10/07/15 117/73  06/25/15 96/56  02/11/15 104/60  Using medication without problems or lightheadedness: NOne  Chest pain with exertion: None  Edema:None  Short of breath:Yes.Marland Kitchen COPD  Average home BPs: not chekcing. Other issues:   COPD, moderate control on occ spiriva daily and advair. Less SOB with exertion, daily cough. Not requiring rescue inhaler.  Constipation, chronic: staining, going weekly. No improvement with miralax, magnesium citrate helps some.Krystal Clark her  cramps.    Review of Systems  Constitutional: Negative for fever, fatigue and unexpected weight change.  HENT: Negative for ear pain, congestion, sore throat, sneezing, trouble swallowing and sinus pressure.  Eyes: Negative for pain and itching.  Respiratory: Positive for shortness of breath. Negative for cough and wheezing.  Cardiovascular: Negative for chest pain, palpitations and leg swelling.  Gastrointestinal: Positive for constipation. Negative for nausea, abdominal pain, diarrhea and blood in stool.  Genitourinary: Negative for dysuria, hematuria, vaginal discharge and difficulty urinating.  Skin: Negative for rash.  Neurological: Negative for syncope, weakness, light-headedness, numbness and headaches.  Psychiatric/Behavioral: Negative for confusion and dysphoric mood. The patient is not nervous/anxious.  Objective:   Physical Exam  Constitutional: Vital signs are normal. She appears well-developed and well-nourished. She is cooperative. Non-toxic appearance. She does not appear ill. No distress.  Obese.. telangectasias on abdomen and face  HENT:  Head: Normocephalic.  Right Ear: Hearing, tympanic membrane, external ear and ear canal normal.  Left Ear: Hearing, tympanic membrane, external ear and ear canal normal.  Nose: Nose normal.  Eyes: Conjunctivae, EOM and lids are normal. Pupils are equal, round, and reactive to light. No foreign bodies found.  Neck: Trachea normal and normal range of motion. Neck supple. Carotid bruit is not present. No mass and no thyromegaly present.  Cardiovascular: Normal rate, regular rhythm, S1 normal, S2 normal, normal heart sounds and intact distal pulses. Exam reveals no gallop.  No murmur heard.  Pulmonary/Chest: Effort normal and breath sounds normal. No respiratory distress.  She has no wheezes. She has no rhonchi. She has no rales.  Abdominal: Soft. Normal appearance and bowel sounds are normal. She exhibits no distension,  no fluid wave, no abdominal bruit and no mass. There is no hepatosplenomegaly. There is no tenderness. There is no rebound, no guarding and no CVA tenderness. No hernia.  Lymphadenopathy:  She has no cervical adenopathy.  She has no axillary adenopathy.  Neurological: She is alert. She has normal strength. No cranial nerve deficit or sensory deficit.  Skin: Skin is warm, dry and intact. No rash noted.  Psychiatric: Her speech is normal and behavior is normal. Judgment normal. Her mood appears not anxious. Cognition and memory are normal. She does not exhibit a depressed mood.  Assessment & Plan:   The patient's preventative maintenance and recommended screening tests for an annual wellness exam were reviewed in full today.  Brought up to date unless services declined.  Counselled on the importance of diet, exercise, and its role in overall health and mortality.  The patient's FH and SH was reviewed, including their home life, tobacco status, and drug and alcohol status.  Vaccines: uptodate with Td, flu, PNA, considering shingles vaccine. Colon: Colonoscopy [facility]. Penelope Coop. 08/2015 polyps, repeat in 5 years. Mammo: 08/2015 DEXA:   due. PAP/DVE: TAH, not indicated pap or DVE. Smoker.Dennie Maizes! CXR no masses 2014, moderate COPD on spirometry       QUIT alcohol.

## 2015-10-07 NOTE — Patient Instructions (Addendum)
Stop at lab on way out.  Increase water and fiber in diet.  Start linzess daily. Check with insurance about shingles vaccine.  Stop at front desk to set up bone density. and derm referral.

## 2015-10-07 NOTE — Progress Notes (Signed)
Pre visit review using our clinic review tool, if applicable. No additional management support is needed unless otherwise documented below in the visit note. 

## 2015-10-07 NOTE — Assessment & Plan Note (Signed)
Well controlled on no med. Encouraged exercise, weight loss, healthy eating habits.   

## 2015-10-07 NOTE — Assessment & Plan Note (Signed)
Stable control using advair and spiriva prn.

## 2015-10-08 ENCOUNTER — Telehealth: Payer: Self-pay | Admitting: Family Medicine

## 2015-10-08 LAB — HEPATITIS C ANTIBODY: HCV AB: NEGATIVE

## 2015-10-08 NOTE — Telephone Encounter (Signed)
See Result Note from labs on 10/07/2015.

## 2015-10-08 NOTE — Telephone Encounter (Signed)
Calls go straight to voicemail and then it states her vm is not set up so I am unable to leave a message.  I called and spoke to Mr. Ngo to advise him to have her check her phone and also ask to have her try and call me again before 5 pm.

## 2015-10-08 NOTE — Telephone Encounter (Signed)
Pt requesting cb  Please cb at [phone] Thank you

## 2015-10-25 DIAGNOSIS — M8589 Other specified disorders of bone density and structure, multiple sites: Secondary | ICD-10-CM | POA: Diagnosis not present

## 2015-10-25 DIAGNOSIS — Z780 Asymptomatic menopausal state: Secondary | ICD-10-CM | POA: Diagnosis not present

## 2015-10-29 ENCOUNTER — Encounter: Payer: Self-pay | Admitting: Family Medicine

## 2015-10-29 DIAGNOSIS — M8580 Other specified disorders of bone density and structure, unspecified site: Secondary | ICD-10-CM | POA: Insufficient documentation

## 2015-11-11 ENCOUNTER — Ambulatory Visit (INDEPENDENT_AMBULATORY_CARE_PROVIDER_SITE_OTHER): Payer: PPO | Admitting: Family Medicine

## 2015-11-11 ENCOUNTER — Encounter: Payer: Self-pay | Admitting: Family Medicine

## 2015-11-11 VITALS — BP 126/82 | HR 66 | Temp 98.3°F | Ht 63.25 in | Wt 225.4 lb

## 2015-11-11 DIAGNOSIS — J441 Chronic obstructive pulmonary disease with (acute) exacerbation: Secondary | ICD-10-CM

## 2015-11-11 DIAGNOSIS — J029 Acute pharyngitis, unspecified: Secondary | ICD-10-CM | POA: Diagnosis not present

## 2015-11-11 MED ORDER — PREDNISONE 50 MG PO TABS: ORAL_TABLET | ORAL | Status: DC

## 2015-11-11 MED ORDER — DOXYCYCLINE HYCLATE 100 MG PO TABS: 100.0000 mg | ORAL_TABLET | Freq: Two times a day (BID) | ORAL | Status: DC

## 2015-11-11 MED ORDER — HYDROCOD POLST-CPM POLST ER 10-8 MG/5ML PO SUER: 5.0000 mL | Freq: Two times a day (BID) | ORAL | Status: DC | PRN

## 2015-11-11 NOTE — Progress Notes (Signed)
Pre visit review using our clinic review tool, if applicable. No additional management support is needed unless otherwise documented below in the visit note. 
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
## 2015-11-11 NOTE — Assessment & Plan Note (Signed)
Patient with likely viral illness that has led to acute COPD exacerbation. She's not hypoxemic but has diffuse wheezing on exam and a productive cough making this consistent with COPD exacerbation. Treating with prednisone, doxy, and tussionex for cough.

## 2015-11-11 NOTE — Assessment & Plan Note (Signed)
Likely viral in origin. Continue supportive care.

## 2015-11-11 NOTE — Patient Instructions (Signed)
You are experiencing a COPD exacerbation.   Take the antibiotic and the prednisone as directed.  Use the cough medication as needed.  Follow up closely.  Take care  Dr. Lacinda Axon

## 2015-11-16 DIAGNOSIS — K5909 Other constipation: Secondary | ICD-10-CM | POA: Insufficient documentation

## 2015-11-16 NOTE — Assessment & Plan Note (Signed)
Increase water and fiber in diet.  Start linzess daily.

## 2015-11-16 NOTE — Addendum Note (Signed)
Addended byEliezer Lofts E on: 11/16/2015 04:25 PM   Modules accepted: SmartSet

## 2015-11-16 NOTE — Assessment & Plan Note (Signed)
Due for re-eval. 

## 2015-12-15 DIAGNOSIS — L400 Psoriasis vulgaris: Secondary | ICD-10-CM | POA: Diagnosis not present

## 2015-12-15 DIAGNOSIS — L821 Other seborrheic keratosis: Secondary | ICD-10-CM | POA: Diagnosis not present

## 2015-12-24 DIAGNOSIS — L400 Psoriasis vulgaris: Secondary | ICD-10-CM | POA: Diagnosis not present

## 2015-12-29 DIAGNOSIS — L400 Psoriasis vulgaris: Secondary | ICD-10-CM | POA: Diagnosis not present

## 2016-01-05 DIAGNOSIS — L400 Psoriasis vulgaris: Secondary | ICD-10-CM | POA: Diagnosis not present

## 2016-01-20 DIAGNOSIS — L819 Disorder of pigmentation, unspecified: Secondary | ICD-10-CM | POA: Diagnosis not present

## 2016-01-20 DIAGNOSIS — L820 Inflamed seborrheic keratosis: Secondary | ICD-10-CM | POA: Diagnosis not present

## 2016-01-20 DIAGNOSIS — L821 Other seborrheic keratosis: Secondary | ICD-10-CM | POA: Diagnosis not present

## 2016-01-20 DIAGNOSIS — L400 Psoriasis vulgaris: Secondary | ICD-10-CM | POA: Diagnosis not present

## 2016-05-19 ENCOUNTER — Ambulatory Visit (INDEPENDENT_AMBULATORY_CARE_PROVIDER_SITE_OTHER): Payer: PPO | Admitting: Family Medicine

## 2016-05-19 ENCOUNTER — Telehealth: Payer: Self-pay

## 2016-05-19 ENCOUNTER — Encounter: Payer: Self-pay | Admitting: Family Medicine

## 2016-05-19 VITALS — BP 102/62 | HR 62 | Temp 97.9°F | Ht 63.25 in | Wt 229.0 lb

## 2016-05-19 DIAGNOSIS — F418 Other specified anxiety disorders: Secondary | ICD-10-CM | POA: Diagnosis not present

## 2016-05-19 DIAGNOSIS — K5900 Constipation, unspecified: Secondary | ICD-10-CM | POA: Diagnosis not present

## 2016-05-19 DIAGNOSIS — Z23 Encounter for immunization: Secondary | ICD-10-CM

## 2016-05-19 DIAGNOSIS — J449 Chronic obstructive pulmonary disease, unspecified: Secondary | ICD-10-CM | POA: Diagnosis not present

## 2016-05-19 DIAGNOSIS — K5909 Other constipation: Secondary | ICD-10-CM

## 2016-05-19 MED ORDER — UMECLIDINIUM-VILANTEROL 62.5-25 MCG/INH IN AEPB: 1.0000 | INHALATION_SPRAY | Freq: Every day | RESPIRATORY_TRACT | 11 refills | Status: DC

## 2016-05-19 MED ORDER — ALPRAZOLAM 0.25 MG PO TABS: 0.2500 mg | ORAL_TABLET | Freq: Every day | ORAL | 0 refills | Status: DC | PRN

## 2016-05-19 NOTE — Assessment & Plan Note (Signed)
No clear GAD or panic disorder, no clinical depression.  Encouraged her to use stress reduction and relaxation techniques instead of medicaiton.  Reviewe SE with benzodiazepine after age 70.  I recommned againts valium 10  Use.  Will given give her low dose alprazolam as needed. Encouraged limiting.  NCSR clear 05/19/2016

## 2016-05-19 NOTE — Telephone Encounter (Signed)
Combivent  Can be continued to use AS NEEDED but it pretty much the same as the proair so they should not be used at the same time. Which ever one she thinks works better will be the one we continue her on.  I will go ahead and have her replace spiriva with [facility] as discussed at [place].  The [facility] is to be taken every day REGULARLY to control the COPD, decrease infecitons and improve SOB.

## 2016-05-19 NOTE — Telephone Encounter (Signed)
Pt was seen earlier today and pt was to cb with name of inhaler; combivent respimat 100 mcg.

## 2016-05-19 NOTE — Progress Notes (Signed)
Pre visit review using our clinic review tool, if applicable. No additional management support is needed unless otherwise documented below in the visit note. 

## 2016-05-19 NOTE — Progress Notes (Signed)
   Subjective:    Patient ID: Melanie Schneider, female    DOB: 08-26-1946, 70 y.o.   MRN: [id number]  HPI   70 year old female presetns for continued constipation. Given  trial of linzess  145 mcg daily earlier in 2017.Marland Kitchen She took for a few days.. Caused diarrhea. Could not tolerate. Also expensive. Using glycerine suppositories, has BM every 3 day, hard.Marland Kitchen occ straining.   She had colonoscopy 08/2015 tubular adenoma.. Repeat due in 5 years. No other abnormalities.   Her previous  GYN MD Dr. Phineas Real was prescribing 10 mg valium #30 per year. She was using for anxiety in  A limited fashion. Occ feeling depressed but not regularly. Lots of family stress, family health issues. About once a month she has times when she is overwhelmed and cannot relex, anxious.  No insomnia. No ETOH abuse, but history. GAD 7: 6/20  COPD, moderate control: Cannot take spiriva given it drys out her throat severely.  Using proair as needed.. Using 2 times a week.     Review of Systems  Constitutional: Negative for fatigue and fever.  HENT: Negative for ear pain.   Eyes: Negative for pain.  Respiratory: Positive for shortness of breath and wheezing. Negative for cough and chest tightness.   Cardiovascular: Negative for chest pain, palpitations and leg swelling.  Gastrointestinal: Negative for abdominal pain.  Genitourinary: Negative for dysuria.       Objective:   Physical Exam  Constitutional: Vital signs are normal. She appears well-developed and well-nourished. She is cooperative.  Non-toxic appearance. She does not appear ill. No distress.  Chronically ill appearing female in NAD.   HENT:  Head: Normocephalic and atraumatic.  Right Ear: Hearing, tympanic membrane, external ear and ear canal normal. Tympanic membrane is not erythematous, not retracted and not bulging.  Left Ear: Hearing, tympanic membrane, external ear and ear canal normal. Tympanic membrane is not erythematous, not retracted  and not bulging.  Nose: No mucosal edema or rhinorrhea. Right sinus exhibits no maxillary sinus tenderness and no frontal sinus tenderness. Left sinus exhibits no maxillary sinus tenderness and no frontal sinus tenderness.  Mouth/Throat: Uvula is midline, oropharynx is clear and moist and mucous membranes are normal.  Eyes: Conjunctivae, EOM and lids are normal. Pupils are equal, round, and reactive to light. Lids are everted and swept, no foreign bodies found.  Neck: Trachea normal and normal range of motion. Neck supple. Carotid bruit is not present. No thyroid mass and no thyromegaly present.  Cardiovascular: Normal rate, regular rhythm, S1 normal, S2 normal, normal heart sounds, intact distal pulses and normal pulses.  Exam reveals no gallop and no friction rub.   No murmur heard. Pulmonary/Chest: Effort normal. No tachypnea. No respiratory distress. She has decreased breath sounds. She has no wheezes. She has no rhonchi. She has no rales.  No increased WOB.   Abdominal: Soft. Normal appearance and bowel sounds are normal. There is no hepatosplenomegaly. There is no tenderness. There is no rebound, no guarding and no CVA tenderness.  Central obesity  Neurological: She is alert.  Skin: Skin is warm, dry and intact. No rash noted.  Psychiatric: She has a normal mood and affect. Her speech is normal and behavior is normal. Judgment and thought content normal. Her mood appears not anxious. Cognition and memory are normal. She does not exhibit a depressed mood.  Vitals reviewed.         Assessment & Plan:

## 2016-05-19 NOTE — Assessment & Plan Note (Signed)
Improved with glycerine but not ideal.  Not interested in amitiza after diarrhea with linzess. Encouraged her to exercise, add miralax and increase fiber in diet.

## 2016-05-19 NOTE — Assessment & Plan Note (Signed)
Not clear what med she is taking.  She is interested in trying anoro as Spiriva and adviar not tolerated. Reviewed use of albuterol to rescue only.

## 2016-05-19 NOTE — Patient Instructions (Addendum)
Continue glycerin suppositories and add miralax every 1-2 days.  Call to let me know what the yellow inhaler is.  We will then decide how to adjust COPD meds. Limit use of alprazolam as able.

## 2016-05-19 NOTE — Telephone Encounter (Signed)
Mrs. Sorbo notified as instructed by telephone.  

## 2016-05-22 ENCOUNTER — Telehealth: Payer: Self-pay | Admitting: *Deleted

## 2016-05-22 NOTE — Telephone Encounter (Signed)
Received fax from [facility] requesting PA for Anoro.  PA completed on CoverMyMeds.  Awaiting decision.

## 2016-05-22 NOTE — Telephone Encounter (Signed)
Patient left a voice mail message requesting Butch Penny to  call back regarding the medication: Anoro.

## 2016-05-23 MED ORDER — ALBUTEROL SULFATE HFA 108 (90 BASE) MCG/ACT IN AERS: 2.0000 | INHALATION_SPRAY | Freq: Four times a day (QID) | RESPIRATORY_TRACT | 2 refills | Status: DC | PRN

## 2016-05-23 NOTE — Telephone Encounter (Signed)
Patient returned Donna's call. °

## 2016-05-23 NOTE — Telephone Encounter (Signed)
Left message for Melanie Schneider to return my call. 

## 2016-05-23 NOTE — Telephone Encounter (Signed)
Spoke with Melanie Schneider.  She states the Anoro wasn't covered by her insurance. I advised that we had to do a PA and it medication has been approved through her insurance.  Approval letter received today.  She also states that the [employer] seems to work best for her and requested a refill.  Refill sent to [facility] as requested.

## 2016-05-23 NOTE — Addendum Note (Signed)
Addended by: Carter Kitten on: 05/23/2016 10:08 AM   Modules accepted: Orders

## 2016-05-23 NOTE — Telephone Encounter (Signed)
Anoro approved from 05/22/2016 until 09/24/2016.  Patient & Pharmacy notified.

## 2016-08-23 ENCOUNTER — Telehealth: Payer: Self-pay | Admitting: Family Medicine

## 2016-08-23 NOTE — Telephone Encounter (Signed)
LVM for pt to call back and schedule AWV + labs with Lesia and CPE with PCP. °

## 2016-08-23 NOTE — Telephone Encounter (Signed)
AWV 10/09/16 CPE 10/17/16  Pt aware

## 2016-10-09 ENCOUNTER — Ambulatory Visit (INDEPENDENT_AMBULATORY_CARE_PROVIDER_SITE_OTHER): Payer: PPO

## 2016-10-09 VITALS — BP 112/78 | HR 56 | Temp 97.9°F | Ht 63.0 in | Wt 227.5 lb

## 2016-10-09 DIAGNOSIS — Z0000 Encounter for general adult medical examination without abnormal findings: Secondary | ICD-10-CM

## 2016-10-09 DIAGNOSIS — R7989 Other specified abnormal findings of blood chemistry: Secondary | ICD-10-CM | POA: Diagnosis not present

## 2016-10-09 DIAGNOSIS — R7303 Prediabetes: Secondary | ICD-10-CM

## 2016-10-09 DIAGNOSIS — E538 Deficiency of other specified B group vitamins: Secondary | ICD-10-CM

## 2016-10-09 DIAGNOSIS — I10 Essential (primary) hypertension: Secondary | ICD-10-CM

## 2016-10-09 DIAGNOSIS — E7800 Pure hypercholesterolemia, unspecified: Secondary | ICD-10-CM | POA: Diagnosis not present

## 2016-10-09 LAB — COMPREHENSIVE METABOLIC PANEL
ALK PHOS: 65 U/L (ref 39–117)
ALT: 8 U/L (ref 0–35)
AST: 9 U/L (ref 0–37)
Albumin: 3.9 g/dL (ref 3.5–5.2)
BUN: 14 mg/dL (ref 6–23)
CHLORIDE: 105 meq/L (ref 96–112)
CO2: 30 mEq/L (ref 19–32)
Calcium: 9 mg/dL (ref 8.4–10.5)
Creatinine, Ser: 0.53 mg/dL (ref 0.40–1.20)
GFR: 121.07 mL/min (ref >60.00)
GLUCOSE: 92 mg/dL (ref 70–99)
POTASSIUM: 4 meq/L (ref 3.5–5.1)
Sodium: 141 mEq/L (ref 135–145)
TOTAL PROTEIN: 7 g/dL (ref 6.0–8.3)
Total Bilirubin: 0.5 mg/dL (ref 0.2–1.2)

## 2016-10-09 LAB — CBC WITH DIFFERENTIAL/PLATELET
BASOS PCT: 0.4 % (ref 0.0–3.0)
Basophils Absolute: 0 10*3/uL (ref 0.0–0.1)
EOS ABS: 0.3 10*3/uL (ref 0.0–0.7)
Eosinophils Relative: 3.9 % (ref 0.0–5.0)
HEMATOCRIT: 38.8 % (ref 36.0–46.0)
HEMOGLOBIN: 12.8 g/dL (ref 12.0–15.0)
LYMPHS PCT: 23.5 % (ref 12.0–46.0)
Lymphs Abs: 1.9 10*3/uL (ref 0.7–4.0)
MCHC: 33 g/dL (ref 30.0–36.0)
MCV: 84 fl (ref 78.0–100.0)
MONOS PCT: 5.6 % (ref 3.0–12.0)
Monocytes Absolute: 0.5 10*3/uL (ref 0.1–1.0)
Neutro Abs: 5.4 10*3/uL (ref 1.4–7.7)
Neutrophils Relative %: 66.6 % (ref 43.0–77.0)
Platelets: 338 10*3/uL (ref 150.0–400.0)
RBC: 4.62 Mil/uL (ref 3.87–5.11)
RDW: 14.6 % (ref 11.5–15.5)
WBC: 8.1 10*3/uL (ref 4.0–10.5)

## 2016-10-09 LAB — LIPID PANEL
CHOL/HDL RATIO: 5
Cholesterol: 169 mg/dL (ref 0–200)
HDL: 35 mg/dL — ABNORMAL LOW (ref >39.00)
LDL Cholesterol: 119 mg/dL — ABNORMAL HIGH (ref 0–99)
NONHDL: 134.21
Triglycerides: 76 mg/dL (ref 0.0–149.0)
VLDL: 15.2 mg/dL (ref 0.0–40.0)

## 2016-10-09 LAB — VITAMIN B12: Vitamin B-12: 137 pg/mL — ABNORMAL LOW (ref 211–911)

## 2016-10-09 LAB — HEMOGLOBIN A1C: Hgb A1c MFr Bld: 5.8 % (ref 4.6–6.5)

## 2016-10-09 LAB — TSH: TSH: 2.15 u[IU]/mL (ref 0.35–4.50)

## 2016-10-09 NOTE — Progress Notes (Signed)
PCP notes:   Health maintenance:  Shingles - postponed/insurance  Abnormal screenings:   Hearing - failed  Patient concerns:   Pt has concerns with neuropathy in both feet.  Nurse concerns:  None  Next PCP appt:   10/17/16 @ 1100
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
## 2016-10-09 NOTE — Progress Notes (Signed)
Pre visit review using our clinic review tool, if applicable. No additional management support is needed unless otherwise documented below in the visit note. 

## 2016-10-09 NOTE — Progress Notes (Signed)
I reviewed health advisor's note, was available for consultation, and agree with documentation and plan.   Signed,  Yachet Mattson T. Ash Mcelwain, MD  

## 2016-10-09 NOTE — Patient Instructions (Addendum)
**NOTE: Please contact insurance to discuss coverage for Shingles vaccine.    Melanie Schneider , Thank you for taking time to come for your Medicare Wellness Visit. I appreciate your ongoing commitment to your health goals. Please review the following plan we discussed and let me know if I can assist you in the future.   These are the goals we discussed: Goals    . Increase water intake          Starting 10/09/2016, I will attempt to drink at least 6-8 glasses of water daily.        This is a list of the screening recommended for you and due dates:  Health Maintenance  Topic Date Due  . Shingles Vaccine  10/09/2017*  . Mammogram  09/25/2017  . Tetanus Vaccine  05/27/2019  . Colon Cancer Screening  09/05/2025  . Flu Shot  Completed  . DEXA scan (bone density measurement)  Completed  .  Hepatitis C: One time screening is recommended by Center for Disease Control  (CDC) for  adults born from [birth year] through 1965.   Completed  . Pneumonia vaccines  Completed  *Topic was postponed. The date shown is not the original due date.    Preventive Care for Adults  A healthy lifestyle and preventive care can promote health and wellness. Preventive health guidelines for adults include the following key practices.  . A routine yearly physical is a good way to check with your health care provider about your health and preventive screening. It is a chance to share any concerns and updates on your health and to receive a thorough exam.  . Visit your dentist for a routine exam and preventive care every 6 months. Brush your teeth twice a day and floss once a day. Good oral hygiene prevents tooth decay and gum disease.  . The frequency of eye exams is based on your age, health, family medical history, use  of contact lenses, and other factors. Follow your health care provider's ecommendations for frequency of eye exams.  . Eat a healthy diet. Foods like vegetables, fruits, whole grains, low-fat dairy  products, and lean protein foods contain the nutrients you need without too many calories. Decrease your intake of foods high in solid fats, added sugars, and salt. Eat the right amount of calories for you. Get information about a proper diet from your health care provider, if necessary.  . Regular physical exercise is one of the most important things you can do for your health. Most adults should get at least 150 minutes of moderate-intensity exercise (any activity that increases your heart rate and causes you to sweat) each week. In addition, most adults need muscle-strengthening exercises on 2 or more days a week.  Silver Sneakers may be a benefit available to you. To determine eligibility, you may visit the website: www.silversneakers.com or contact program at 9348398658 Mon-Fri between 8AM-8PM.   . Maintain a healthy weight. The body mass index (BMI) is a screening tool to identify possible weight problems. It provides an estimate of body fat based on height and weight. Your health care provider can find your BMI and can help you achieve or maintain a healthy weight.   For adults 20 years and older: ? A BMI below 18.5 is considered underweight. ? A BMI of 18.5 to 24.9 is normal. ? A BMI of 25 to 29.9 is considered overweight. ? A BMI of 30 and above is considered obese.   . Maintain normal blood lipids and  cholesterol levels by exercising and minimizing your intake of saturated fat. Eat a balanced diet with plenty of fruit and vegetables. Blood tests for lipids and cholesterol should begin at age 38 and be repeated every 5 years. If your lipid or cholesterol levels are high, you are over 50, or you are at high risk for heart disease, you may need your cholesterol levels checked more frequently. Ongoing high lipid and cholesterol levels should be treated with medicines if diet and exercise are not working.  . If you smoke, find out from your health care provider how to quit. If you do not  use tobacco, please do not start.  . If you choose to drink alcohol, please do not consume more than 2 drinks per day. One drink is considered to be 12 ounces (355 mL) of beer, 5 ounces (148 mL) of wine, or 1.5 ounces (44 mL) of liquor.  . If you are 20-62 years old, ask your health care provider if you should take aspirin to prevent strokes.  . Use sunscreen. Apply sunscreen liberally and repeatedly throughout the day. You should seek shade when your shadow is shorter than you. Protect yourself by wearing long sleeves, pants, a wide-brimmed hat, and sunglasses year round, whenever you are outdoors.  . Once a month, do a whole body skin exam, using a mirror to look at the skin on your back. Tell your health care provider of new moles, moles that have irregular borders, moles that are larger than a pencil eraser, or moles that have changed in shape or color.

## 2016-10-17 ENCOUNTER — Ambulatory Visit (INDEPENDENT_AMBULATORY_CARE_PROVIDER_SITE_OTHER): Payer: PPO | Admitting: Family Medicine

## 2016-10-17 ENCOUNTER — Encounter: Payer: Self-pay | Admitting: Family Medicine

## 2016-10-17 VITALS — BP 118/68 | HR 103 | Temp 97.8°F | Ht 63.0 in | Wt 231.0 lb

## 2016-10-17 DIAGNOSIS — R7309 Other abnormal glucose: Secondary | ICD-10-CM | POA: Diagnosis not present

## 2016-10-17 DIAGNOSIS — E7800 Pure hypercholesterolemia, unspecified: Secondary | ICD-10-CM

## 2016-10-17 DIAGNOSIS — Z87891 Personal history of nicotine dependence: Secondary | ICD-10-CM

## 2016-10-17 DIAGNOSIS — I10 Essential (primary) hypertension: Secondary | ICD-10-CM

## 2016-10-17 DIAGNOSIS — E538 Deficiency of other specified B group vitamins: Secondary | ICD-10-CM

## 2016-10-17 DIAGNOSIS — J449 Chronic obstructive pulmonary disease, unspecified: Secondary | ICD-10-CM | POA: Diagnosis not present

## 2016-10-17 DIAGNOSIS — F418 Other specified anxiety disorders: Secondary | ICD-10-CM

## 2016-10-17 DIAGNOSIS — G609 Hereditary and idiopathic neuropathy, unspecified: Secondary | ICD-10-CM

## 2016-10-17 DIAGNOSIS — R7303 Prediabetes: Secondary | ICD-10-CM | POA: Diagnosis not present

## 2016-10-17 DIAGNOSIS — I5032 Chronic diastolic (congestive) heart failure: Secondary | ICD-10-CM

## 2016-10-17 MED ORDER — TRIAMCINOLONE ACETONIDE 0.5 % EX CREA: TOPICAL_CREAM | CUTANEOUS | 0 refills | Status: DC

## 2016-10-17 MED ORDER — ATORVASTATIN CALCIUM 40 MG PO TABS: 40.0000 mg | ORAL_TABLET | Freq: Every day | ORAL | 11 refills | Status: DC

## 2016-10-17 NOTE — Assessment & Plan Note (Signed)
Given high risk for CVD.Marland Kitchen Start high dose atorvastatin.  Follow up in 3 months.

## 2016-10-17 NOTE — Progress Notes (Signed)
Subjective:    Patient ID: Melanie Schneider, female    DOB: [birth year], 71 y.o.   MRN: [id number]  HPI The patient saw Candis Musa, LPN for medicare wellness. Note reviewed in detail and important notes copied below. Health maintenance: Shingles - postponed/insurance  Abnormal screenings:  Hearing - failed  Patient concerns:  Pt has concerns with neuropathy in both feet.  Nurse concerns: None   Peripheral neuropathy: not worsening just continue. Poor balancing. Decreased sensation in feet.  Pain controlled on no med.  Elevated Cholesterol: on no medication, improved from last year High risk for CVD.Marland Kitchen 17% 10 year risk per AHA risk calculator Lab Results  Component Value Date   CHOL 169 10/09/2016   HDL 35.00 (L) 10/09/2016   LDLCALC 119 (H) 10/09/2016   LDLDIRECT 119.9 10/07/2013   TRIG 76.0 10/09/2016   CHOLHDL 5 10/09/2016  Using medications without problems: Muscle aches:  Diet compliance: poor Exercise: limited Other complaints:   Hypertension:   BP Readings from Last 3 Encounters:  10/17/16 118/68  10/09/16 112/78  05/19/16 102/62  Using medication without problems or lightheadedness:  none Chest pain with exertion:none Edema:none Short of breath:yes Average home BPs: good Other issues:  COPD moderate control on Anoro ellipta.. Using only as needed given cost. Not requiring albuterol rescue   prediabetes:  Good control with diet  Lab Results  Component Value Date   HGBA1C 5.8 10/09/2016   Vit B12 low.  Social History /Family History/Past Medical History reviewed and updated if needed.  Review of Systems  Constitutional: Negative for fatigue and fever.  HENT: Negative for ear pain.   Eyes: Negative for pain.  Respiratory: Negative for chest tightness and shortness of breath.   Cardiovascular: Negative for chest pain, palpitations and leg swelling.  Gastrointestinal: Negative for abdominal pain.  Genitourinary: Negative for  dysuria.       Objective:   Physical Exam  Constitutional: Vital signs are normal. She appears well-developed and well-nourished. She is cooperative.  Non-toxic appearance. She does not appear ill. No distress.  Central obesity  walks with cane  HENT:  Head: Normocephalic.  Right Ear: Hearing, tympanic membrane, external ear and ear canal normal.  Left Ear: Hearing, tympanic membrane, external ear and ear canal normal.  Nose: Nose normal.  Eyes: Conjunctivae, EOM and lids are normal. Pupils are equal, round, and reactive to light. Lids are everted and swept, no foreign bodies found.  Neck: Trachea normal and normal range of motion. Neck supple. Carotid bruit is not present. No thyroid mass and no thyromegaly present.  Cardiovascular: Normal rate, regular rhythm, S1 normal, S2 normal, normal heart sounds and intact distal pulses.  Exam reveals no gallop.   No murmur heard. Pulmonary/Chest: Effort normal and breath sounds normal. No respiratory distress. She has no wheezes. She has no rhonchi. She has no rales.  Abdominal: Soft. Normal appearance and bowel sounds are normal. She exhibits no distension, no fluid wave, no abdominal bruit and no mass. There is no hepatosplenomegaly. There is no tenderness. There is no rebound, no guarding and no CVA tenderness. No hernia.  Lymphadenopathy:    She has no cervical adenopathy.    She has no axillary adenopathy.  Neurological: She is alert. She has normal strength. A sensory deficit is present. No cranial nerve deficit. Coordination and gait abnormal.   Decreased sensation in bilateral legs, chronic venous stasis changes and varicosities   Skin: Skin is warm, dry and intact. No rash noted.  Dry  patch on left lateral ankle  Psychiatric: Her speech is normal and behavior is normal. Judgment normal. Her mood appears not anxious. Cognition and memory are normal. She does not exhibit a depressed mood.          Assessment & Plan:  The patient's  preventative maintenance and recommended screening tests for an annual wellness exam were reviewed in full today. Brought up to date unless services declined.  Counselled on the importance of diet, exercise, and its role in overall health and mortality. The patient's FH and SH was reviewed, including their home life, tobacco status, and drug and alcohol status.   Vaccines: uptodate except shingles. Pap/DVE: not indicated,, s/p TAH Mammo: 2016, q 2 year Bone Density: Colon:  08/2015 Smoking Status: former smoker, > 40 years ETOH/ drug use: no ETOH in several year.  Hep C:  done

## 2016-10-17 NOTE — Assessment & Plan Note (Signed)
Restart supplement. May helps ome with neuropathy.

## 2016-10-17 NOTE — Assessment & Plan Note (Signed)
Euvolemic. BP at goal.

## 2016-10-17 NOTE — Assessment & Plan Note (Signed)
Work on low [employer].

## 2016-10-17 NOTE — Assessment & Plan Note (Signed)
Fairly stable on no med. Pt does not wish to treat.

## 2016-10-17 NOTE — Patient Instructions (Addendum)
Decreasing sweets, work on walking as much as possible  Restart B12 vitamin daily. Try to take anoro regularly  Start atorvastatin 40 mg daily. Call to see if insurance covers shingles vaccines  Stop at front desk to seyt up lung cancer screening program.

## 2016-10-17 NOTE — Assessment & Plan Note (Signed)
Rare use of alprazolam, no refill needed at this time.

## 2016-10-17 NOTE — Progress Notes (Signed)
Pre visit review using our clinic review tool, if applicable. No additional management support is needed unless otherwise documented below in the visit note. 

## 2016-11-10 ENCOUNTER — Other Ambulatory Visit: Payer: Self-pay | Admitting: Acute Care

## 2016-11-10 DIAGNOSIS — Z87891 Personal history of nicotine dependence: Secondary | ICD-10-CM

## 2016-11-24 ENCOUNTER — Ambulatory Visit (INDEPENDENT_AMBULATORY_CARE_PROVIDER_SITE_OTHER): Payer: PPO | Admitting: Acute Care

## 2016-11-24 ENCOUNTER — Encounter: Payer: Self-pay | Admitting: Acute Care

## 2016-11-24 ENCOUNTER — Ambulatory Visit (INDEPENDENT_AMBULATORY_CARE_PROVIDER_SITE_OTHER)
Admission: RE | Admit: 2016-11-24 | Discharge: 2016-11-24 | Disposition: A | Discharge status: Home / Self Care | Payer: PPO | Source: Ambulatory Visit | Attending: Acute Care | Admitting: Acute Care

## 2016-11-24 DIAGNOSIS — Z87891 Personal history of nicotine dependence: Secondary | ICD-10-CM

## 2016-11-24 NOTE — Progress Notes (Signed)
Shared Decision Making Visit Lung Cancer Screening Program [phone])   Eligibility:  Age 71 y.o.  Pack Years Smoking History Calculation 51 pack years (# packs/per year x # years smoked)  Recent History of coughing up blood  no  Unexplained weight loss? no ( >Than 15 pounds within the last 6 months )  Prior History Lung / other cancer no (Diagnosis within the last 5 years already requiring surveillance chest CT Scans).  Smoking Status Former Smoker  Former Smokers: Years since quit: 06/2013  Quit Date: 06/2013  Visit Components:  Discussion included one or more decision making aids. yes  Discussion included risk/benefits of screening. yes  Discussion included potential follow up diagnostic testing for abnormal scans. yes  Discussion included meaning and risk of over diagnosis. yes  Discussion included meaning and risk of False Positives. yes  Discussion included meaning of total radiation exposure. yes  Counseling Included:  Importance of adherence to annual lung cancer LDCT screening. yes  Impact of comorbidities on ability to participate in the program. yes  Ability and willingness to under diagnostic treatment. yes  Smoking Cessation Counseling:  Current Smokers:   Discussed importance of smoking cessation. yes  Information about tobacco cessation classes and interventions provided to patient. yes  Patient provided with "ticket" for LDCT Scan. yes  Symptomatic Patient. no  Counseling  Diagnosis Code: Tobacco Use Z72.0  Asymptomatic Patient yes  Counseling (Intermediate counseling: > three minutes counseling) [id number]  Former Smokers:   Discussed the importance of maintaining cigarette abstinence. yes  Diagnosis Code: Personal History of Nicotine Dependence. [id number]  Information about tobacco cessation classes and interventions provided to patient. Yes  Patient provided with "ticket" for LDCT Scan. yes  Written Order for Lung Cancer Screening  with LDCT placed in Epic. Yes (CT Chest Lung Cancer Screening Low Dose W/O CM) [id number] Z12.2-Screening of respiratory organs Z87.891-Personal history of nicotine dependence  I spent 25 minutes of face to face time with Ms. Danby discussing the risks and benefits of lung cancer screening. We viewed a power point together that explained in detail the above noted topics. We took the time to pause the power point at intervals to allow for questions to be asked and answered to ensure understanding. We discussed that she had taken the single most powerful action possible to decrease her risk of developing lung cancer when she quit smoking. I counseled her to remain smoke free, and to contact me if she ever had the desire to smoke again so that I can provide resources and tools to help support the effort to remain smoke free. We discussed the time and location of the scan, and that either Doroteo Glassman, RN or I will call with the results within  24-48 hours of receiving them. She  has my card and contact information in the event she needs to speak with me, in addition to a copy of the power point we reviewed as a resource. She verbalized understanding of all of the above and had no further questions upon leaving the office.   We discussed that we are noting a high incidence of CAD on these scans. She is currently on statin therapy.  Magdalen Spatz, NP 11/24/2016

## 2016-11-28 ENCOUNTER — Other Ambulatory Visit: Payer: Self-pay | Admitting: Acute Care

## 2016-11-28 DIAGNOSIS — Z87891 Personal history of nicotine dependence: Secondary | ICD-10-CM

## 2017-01-05 ENCOUNTER — Encounter: Payer: Self-pay | Admitting: Family Medicine

## 2017-01-05 ENCOUNTER — Ambulatory Visit (INDEPENDENT_AMBULATORY_CARE_PROVIDER_SITE_OTHER): Payer: PPO | Admitting: Family Medicine

## 2017-01-05 ENCOUNTER — Ambulatory Visit: Payer: PPO | Admitting: Family Medicine

## 2017-01-05 DIAGNOSIS — R21 Rash and other nonspecific skin eruption: Secondary | ICD-10-CM

## 2017-01-05 MED ORDER — NYSTATIN 100000 UNIT/GM EX CREA: 1.0000 "application " | TOPICAL_CREAM | Freq: Two times a day (BID) | CUTANEOUS | 0 refills | Status: DC

## 2017-01-05 MED ORDER — FLUCONAZOLE 150 MG PO TABS: ORAL_TABLET | ORAL | 0 refills | Status: DC

## 2017-01-05 NOTE — Progress Notes (Signed)
Pre visit review using our clinic review tool, if applicable. No additional management support is needed unless otherwise documented below in the visit note. 

## 2017-01-05 NOTE — Assessment & Plan Note (Signed)
Swab for HSV.Marland Kitchen Send viral cultuer.  More likely severe candida infection.. Less liekly bacterial infection... No abscesses seen.  Treat with diflucan and nystatin cream.

## 2017-01-05 NOTE — Progress Notes (Signed)
   Subjective:    Patient ID: Melanie Schneider, female    DOB: 1946-05-20, 71 y.o.   MRN: [id number]  HPI  71 year old presents with new onset rash on backside x 2 weeks. Noted constipation, straining with BM, felt tear and then pain with BMs Then noted rash in butt crack and near rectum. He has noted some thick yellow discharge. Pain with sitting on area. She treated it with Desitin.   Rash has improved some now in last few day.  No pain with BMs on linzess ( uses prn)  She did recently have new toilet paper prior to having rash.. Now switched back to scott.   No fever, no flu like symptoms.    Review of Systems  Constitutional: Negative for fatigue and fever.  HENT: Negative for ear pain.   Eyes: Negative for pain.  Respiratory: Negative for chest tightness and shortness of breath.   Cardiovascular: Negative for chest pain, palpitations and leg swelling.  Gastrointestinal: Negative for abdominal pain.  Genitourinary: Negative for dysuria.       Objective:   Physical Exam  Constitutional: Vital signs are normal. She appears well-developed and well-nourished. She is cooperative.  Non-toxic appearance. She does not appear ill. No distress.  overweight  HENT:  Head: Normocephalic.  Right Ear: Hearing, tympanic membrane, external ear and ear canal normal. Tympanic membrane is not erythematous, not retracted and not bulging.  Left Ear: Hearing, tympanic membrane, external ear and ear canal normal. Tympanic membrane is not erythematous, not retracted and not bulging.  Nose: No mucosal edema or rhinorrhea. Right sinus exhibits no maxillary sinus tenderness and no frontal sinus tenderness. Left sinus exhibits no maxillary sinus tenderness and no frontal sinus tenderness.  Mouth/Throat: Uvula is midline, oropharynx is clear and moist and mucous membranes are normal.  Eyes: Conjunctivae, EOM and lids are normal. Pupils are equal, round, and reactive to light. Lids are everted and  swept, no foreign bodies found.  Neck: Trachea normal and normal range of motion. Neck supple. Carotid bruit is not present. No thyroid mass and no thyromegaly present.  Cardiovascular: Normal rate, regular rhythm, S1 normal, S2 normal, normal heart sounds, intact distal pulses and normal pulses.  Exam reveals no gallop and no friction rub.   No murmur heard. Pulmonary/Chest: Effort normal and breath sounds normal. No tachypnea. No respiratory distress. She has no decreased breath sounds. She has no wheezes. She has no rhonchi. She has no rales.  Abdominal: Soft. Normal appearance and bowel sounds are normal. There is no tenderness.  Genitourinary:  Genitourinary Comments: Erythema of perineum, mulitple ulcers  At rectum, moist tissue, no induration of tissue, purpura where tissue has been rubbed.  Neurological: She is alert.  Skin: Skin is warm, dry and intact. No rash noted.  Psychiatric: Her speech is normal and behavior is normal. Judgment and thought content normal. Her mood appears not anxious. Cognition and memory are normal. She does not exhibit a depressed mood.          Assessment & Plan:

## 2017-01-05 NOTE — Patient Instructions (Signed)
Apply fungal cream to rash... Continue for 48 hours AFTER symptoms resolved.  Can cover with Desitin to decrease pain in area.  Take fluconazole tablet.. Can repeat in 3 days if not improving.  Call in 1-2 weeks if not resolving.

## 2017-01-05 NOTE — Addendum Note (Signed)
Addended by: Carter Kitten on: 01/05/2017 12:17 PM   Modules accepted: Orders

## 2017-01-08 LAB — HERPES SIMPLEX VIRUS CULTURE: ORGANISM ID, BACTERIA: DETECTED

## 2017-01-09 ENCOUNTER — Telehealth: Payer: Self-pay | Admitting: Family Medicine

## 2017-01-09 DIAGNOSIS — B009 Herpesviral infection, unspecified: Secondary | ICD-10-CM | POA: Insufficient documentation

## 2017-01-09 MED ORDER — VALACYCLOVIR HCL 1 G PO TABS: 1000.0000 mg | ORAL_TABLET | Freq: Two times a day (BID) | ORAL | 0 refills | Status: DC

## 2017-01-09 NOTE — Telephone Encounter (Signed)
rx sent in 

## 2017-01-09 NOTE — Addendum Note (Signed)
Addended by: Carter Kitten on: 01/09/2017 03:46 PM   Modules accepted: Orders

## 2017-01-09 NOTE — Telephone Encounter (Signed)
-----   Message from Carter Kitten, [place] sent at 01/09/2017  1:44 PM EDT ----- Mrs. Ourada notified as instructed by telephone.  She would like Rx for Valacyclovir sent to [facility].

## 2017-01-15 ENCOUNTER — Other Ambulatory Visit (INDEPENDENT_AMBULATORY_CARE_PROVIDER_SITE_OTHER): Payer: PPO

## 2017-01-15 ENCOUNTER — Telehealth: Payer: Self-pay | Admitting: Family Medicine

## 2017-01-15 DIAGNOSIS — M8580 Other specified disorders of bone density and structure, unspecified site: Secondary | ICD-10-CM | POA: Diagnosis not present

## 2017-01-15 DIAGNOSIS — R7309 Other abnormal glucose: Secondary | ICD-10-CM

## 2017-01-15 DIAGNOSIS — E538 Deficiency of other specified B group vitamins: Secondary | ICD-10-CM

## 2017-01-15 DIAGNOSIS — E7800 Pure hypercholesterolemia, unspecified: Secondary | ICD-10-CM

## 2017-01-15 LAB — COMPREHENSIVE METABOLIC PANEL
ALT: 9 U/L (ref 0–35)
AST: 11 U/L (ref 0–37)
Albumin: 3.9 g/dL (ref 3.5–5.2)
Alkaline Phosphatase: 79 U/L (ref 39–117)
BILIRUBIN TOTAL: 0.6 mg/dL (ref 0.2–1.2)
BUN: 12 mg/dL (ref 6–23)
CO2: 29 meq/L (ref 19–32)
CREATININE: 0.62 mg/dL (ref 0.40–1.20)
Calcium: 9.3 mg/dL (ref 8.4–10.5)
Chloride: 104 mEq/L (ref 96–112)
GFR: 100.95 mL/min (ref >60.00)
GLUCOSE: 98 mg/dL (ref 70–99)
Potassium: 4.3 mEq/L (ref 3.5–5.1)
Sodium: 139 mEq/L (ref 135–145)
Total Protein: 7 g/dL (ref 6.0–8.3)

## 2017-01-15 LAB — LIPID PANEL
CHOL/HDL RATIO: 4
Cholesterol: 141 mg/dL (ref 0–200)
HDL: 40 mg/dL (ref >39.00)
LDL Cholesterol: 86 mg/dL (ref 0–99)
NONHDL: 100.96
TRIGLYCERIDES: 77 mg/dL (ref 0.0–149.0)
VLDL: 15.4 mg/dL (ref 0.0–40.0)

## 2017-01-15 LAB — VITAMIN B12: Vitamin B-12: 1295 pg/mL — ABNORMAL HIGH (ref 211–911)

## 2017-01-15 LAB — HEMOGLOBIN A1C: Hgb A1c MFr Bld: 6.1 % (ref 4.6–6.5)

## 2017-01-15 LAB — VITAMIN D 25 HYDROXY (VIT D DEFICIENCY, FRACTURES): VITD: 10.68 ng/mL — ABNORMAL LOW (ref 30.00–100.00)

## 2017-01-15 NOTE — Telephone Encounter (Signed)
-----   Message from [name] sent at 01/04/2017  3:10 PM EDT ----- Regarding: Lab orders for Monday, 4.23.18 Lab orders, no f/u appt

## 2017-01-16 ENCOUNTER — Encounter: Payer: Self-pay | Admitting: *Deleted

## 2017-01-16 ENCOUNTER — Telehealth: Payer: Self-pay | Admitting: *Deleted

## 2017-01-16 MED ORDER — VITAMIN D (ERGOCALCIFEROL) 1.25 MG (50000 UNIT) PO CAPS: 50000.0000 [IU] | ORAL_CAPSULE | ORAL | 1 refills | Status: DC

## 2017-01-16 NOTE — Telephone Encounter (Signed)
-----   Message from Jinny Sanders, MD sent at 01/15/2017  4:44 PM EDT ----- Labs look great but vit D is very low. Recommend starting vit D supplementations 50, 000 units weekly  12 weeks, #12, 1 RF

## 2017-01-16 NOTE — Telephone Encounter (Signed)
Left message for Melanie Schneider to return my call.

## 2017-01-16 NOTE — Telephone Encounter (Signed)
Melanie Schneider notified as instructed by telephone.  Letter with results also mailed to patient.  Rx for Vit D sent into [facility] on [address].

## 2017-01-19 ENCOUNTER — Ambulatory Visit: Payer: PPO | Admitting: Family Medicine

## 2017-05-21 ENCOUNTER — Other Ambulatory Visit: Payer: Self-pay | Admitting: *Deleted

## 2017-05-21 NOTE — Telephone Encounter (Signed)
Melanie Schneider accompanied her husband to his office visit with Dr. Lorelei Pont.  While here she requested a refill on her alprazolam. Last office visit 01/05/2017.  Last refilled 05/19/2016.  Patient states she usually gets #30 that last her a year.  She showed me her bottle which still has many tablets left but has now expired.  Patient states she rarely takes it but likes to have it on hand just in case. Ok to refill?

## 2017-05-22 MED ORDER — ALPRAZOLAM 0.25 MG PO TABS: 0.2500 mg | ORAL_TABLET | Freq: Every day | ORAL | 0 refills | Status: DC | PRN

## 2017-05-22 NOTE — Telephone Encounter (Signed)
Alprazolam called into [place], [place] Phone: [phone]

## 2017-06-18 DIAGNOSIS — Z803 Family history of malignant neoplasm of breast: Secondary | ICD-10-CM | POA: Diagnosis not present

## 2017-06-18 DIAGNOSIS — Z1231 Encounter for screening mammogram for malignant neoplasm of breast: Secondary | ICD-10-CM | POA: Diagnosis not present

## 2017-06-20 ENCOUNTER — Encounter: Payer: Self-pay | Admitting: Family Medicine

## 2017-06-21 DIAGNOSIS — N6322 Unspecified lump in the left breast, upper inner quadrant: Secondary | ICD-10-CM | POA: Diagnosis not present

## 2017-06-21 DIAGNOSIS — R922 Inconclusive mammogram: Secondary | ICD-10-CM | POA: Diagnosis not present

## 2017-06-25 DIAGNOSIS — C50912 Malignant neoplasm of unspecified site of left female breast: Secondary | ICD-10-CM

## 2017-06-25 DIAGNOSIS — D0511 Intraductal carcinoma in situ of right breast: Secondary | ICD-10-CM

## 2017-06-25 HISTORY — DX: Intraductal carcinoma in situ of right breast: D05.11

## 2017-06-25 HISTORY — DX: Malignant neoplasm of unspecified site of left female breast: C50.912

## 2017-06-26 ENCOUNTER — Other Ambulatory Visit: Payer: Self-pay | Admitting: Radiology

## 2017-06-26 ENCOUNTER — Encounter: Payer: Self-pay | Admitting: Family Medicine

## 2017-06-26 DIAGNOSIS — C50212 Malignant neoplasm of upper-inner quadrant of left female breast: Secondary | ICD-10-CM | POA: Diagnosis not present

## 2017-06-26 DIAGNOSIS — D0511 Intraductal carcinoma in situ of right breast: Secondary | ICD-10-CM | POA: Diagnosis not present

## 2017-06-26 DIAGNOSIS — D0591 Unspecified type of carcinoma in situ of right breast: Secondary | ICD-10-CM | POA: Diagnosis not present

## 2017-06-26 DIAGNOSIS — C50912 Malignant neoplasm of unspecified site of left female breast: Secondary | ICD-10-CM | POA: Diagnosis not present

## 2017-06-28 ENCOUNTER — Encounter: Payer: Self-pay | Admitting: *Deleted

## 2017-06-28 ENCOUNTER — Telehealth: Payer: Self-pay | Admitting: Hematology and Oncology

## 2017-06-28 DIAGNOSIS — D0511 Intraductal carcinoma in situ of right breast: Secondary | ICD-10-CM

## 2017-06-28 NOTE — Telephone Encounter (Signed)
Patient confirmed 10/10 [facility] appointment, would like packet mailed to her

## 2017-07-02 ENCOUNTER — Encounter: Payer: Self-pay | Admitting: Hematology and Oncology

## 2017-07-03 ENCOUNTER — Encounter: Payer: Self-pay | Admitting: Genetics

## 2017-07-04 ENCOUNTER — Ambulatory Visit (HOSPITAL_BASED_OUTPATIENT_CLINIC_OR_DEPARTMENT_OTHER): Payer: PPO | Admitting: Hematology and Oncology

## 2017-07-04 ENCOUNTER — Other Ambulatory Visit (HOSPITAL_BASED_OUTPATIENT_CLINIC_OR_DEPARTMENT_OTHER): Payer: PPO

## 2017-07-04 ENCOUNTER — Ambulatory Visit
Admission: RE | Admit: 2017-07-04 | Discharge: 2017-07-04 | Disposition: A | Discharge status: Home / Self Care | Payer: PPO | Source: Ambulatory Visit | Attending: Radiation Oncology | Admitting: Radiation Oncology

## 2017-07-04 ENCOUNTER — Encounter: Payer: Self-pay | Admitting: Radiation Oncology

## 2017-07-04 ENCOUNTER — Ambulatory Visit: Payer: PPO | Attending: Surgery | Admitting: Physical Therapy

## 2017-07-04 ENCOUNTER — Ambulatory Visit: Payer: Self-pay | Admitting: Surgery

## 2017-07-04 ENCOUNTER — Encounter: Payer: Self-pay | Admitting: Hematology and Oncology

## 2017-07-04 ENCOUNTER — Encounter: Payer: Self-pay | Admitting: Physical Therapy

## 2017-07-04 DIAGNOSIS — C50912 Malignant neoplasm of unspecified site of left female breast: Secondary | ICD-10-CM | POA: Diagnosis not present

## 2017-07-04 DIAGNOSIS — C50212 Malignant neoplasm of upper-inner quadrant of left female breast: Secondary | ICD-10-CM

## 2017-07-04 DIAGNOSIS — D0511 Intraductal carcinoma in situ of right breast: Secondary | ICD-10-CM | POA: Diagnosis not present

## 2017-07-04 DIAGNOSIS — Z510 Encounter for antineoplastic radiation therapy: Secondary | ICD-10-CM | POA: Insufficient documentation

## 2017-07-04 DIAGNOSIS — Z9889 Other specified postprocedural states: Secondary | ICD-10-CM | POA: Insufficient documentation

## 2017-07-04 DIAGNOSIS — Z170 Estrogen receptor positive status [ER+]: Secondary | ICD-10-CM

## 2017-07-04 DIAGNOSIS — Z853 Personal history of malignant neoplasm of breast: Secondary | ICD-10-CM | POA: Insufficient documentation

## 2017-07-04 DIAGNOSIS — R262 Difficulty in walking, not elsewhere classified: Secondary | ICD-10-CM | POA: Diagnosis not present

## 2017-07-04 DIAGNOSIS — Z803 Family history of malignant neoplasm of breast: Secondary | ICD-10-CM | POA: Insufficient documentation

## 2017-07-04 DIAGNOSIS — R293 Abnormal posture: Secondary | ICD-10-CM | POA: Diagnosis not present

## 2017-07-04 DIAGNOSIS — C50412 Malignant neoplasm of upper-outer quadrant of left female breast: Secondary | ICD-10-CM

## 2017-07-04 DIAGNOSIS — G4700 Insomnia, unspecified: Secondary | ICD-10-CM | POA: Diagnosis not present

## 2017-07-04 LAB — COMPREHENSIVE METABOLIC PANEL
ALT: 9 U/L (ref 0–55)
ANION GAP: 7 meq/L (ref 3–11)
AST: 9 U/L (ref 5–34)
Albumin: 3.4 g/dL — ABNORMAL LOW (ref 3.5–5.0)
Alkaline Phosphatase: 79 U/L (ref 40–150)
BUN: 12.1 mg/dL (ref 7.0–26.0)
CHLORIDE: 107 meq/L (ref 98–109)
CO2: 27 meq/L (ref 22–29)
Calcium: 8.7 mg/dL (ref 8.4–10.4)
Creatinine: 0.7 mg/dL (ref 0.6–1.1)
Glucose: 109 mg/dl (ref 70–140)
Potassium: 3.7 mEq/L (ref 3.5–5.1)
Sodium: 142 mEq/L (ref 136–145)
Total Bilirubin: 0.59 mg/dL (ref 0.20–1.20)
Total Protein: 6.7 g/dL (ref 6.4–8.3)

## 2017-07-04 LAB — CBC WITH DIFFERENTIAL/PLATELET
BASO%: 1.3 % (ref 0.0–2.0)
Basophils Absolute: 0.1 10*3/uL (ref 0.0–0.1)
EOS ABS: 0.2 10*3/uL (ref 0.0–0.5)
EOS%: 3 % (ref 0.0–7.0)
HCT: 38.9 % (ref 34.8–46.6)
HGB: 12.6 g/dL (ref 11.6–15.9)
LYMPH%: 24.9 % (ref 14.0–49.7)
MCH: 27.6 pg (ref 25.1–34.0)
MCHC: 32.4 g/dL (ref 31.5–36.0)
MCV: 85.2 fL (ref 79.5–101.0)
MONO#: 0.4 10*3/uL (ref 0.1–0.9)
MONO%: 5.6 % (ref 0.0–14.0)
NEUT#: 5 10*3/uL (ref 1.5–6.5)
NEUT%: 65.2 % (ref 38.4–76.8)
PLATELETS: 267 10*3/uL (ref 145–400)
RBC: 4.56 10*6/uL (ref 3.70–5.45)
RDW: 14.2 % (ref 11.2–14.5)
WBC: 7.6 10*3/uL (ref 3.9–10.3)
lymph#: 1.9 10*3/uL (ref 0.9–3.3)

## 2017-07-04 NOTE — Patient Instructions (Signed)

## 2017-07-04 NOTE — H&P (Signed)
Melanie Schneider 07/04/2017 7:32 AM Location: [facility] Patient #: [id number] DOB: 07/30/1946 Undefined / Language: Melanie Schneider / Race: White Female  History of Present Illness Melanie Moores A. Braxston Quinter MD; 07/04/2017 3:04 PM) Patient words: patient presents at the request of Dr. Lisbeth Schneider for bilateral breast abnormalities on mammography. On her left breast a 1.8 cm mass in the upper outer quadrant was found in core biopsy showed invasive ductal carcinoma with DCIS ER positive PR positive HER-2/neu negative with a Ki-67 of 15%. A right breast abnorma 1.1 cm. noted cluster of calcifications measuring 1.1 cm. Core biopsy showed DCIS grade 1 to grade 2 ER positive PR positive. Patient denieothny abnormality with either breast.discharge or other abnormality with either breast.  The patient is a 71 year old female.   Past Surgical History Melanie Pummel, RN; 07/04/2017 7:33 AM) Breast Biopsy Bilateral. Hysterectomy (not due to cancer) - Complete Tonsillectomy  Diagnostic Studies History Melanie Pummel, RN; 07/04/2017 7:33 AM) Colonoscopy 1-5 years ago Mammogram within last year Pap Smear >5 years ago  Medication History Melanie Pummel, RN; 07/04/2017 7:33 AM) Medications Reconciled  Social History Melanie Pummel, RN; 07/04/2017 7:33 AM) Alcohol use Remotely quit alcohol use. Caffeine use Carbonated beverages, Coffee. Illicit drug use Remotely quit drug use. Tobacco use Former smoker.  Family History Melanie Pummel, RN; 07/04/2017 7:33 AM) Alcohol Abuse Family Members In General. Anesthetic complications Father. Breast Cancer Family Members In General, Mother. Cancer Father. Melanoma Mother. Migraine Headache Mother.  Pregnancy / Birth History Melanie Pummel, RN; 07/04/2017 7:33 AM) Age at menarche 43 years. Age of menopause 51-55 Contraceptive History Intrauterine device, Oral contraceptives. Gravida 1 Length (months) of breastfeeding 3-6 Maternal  age 4-25 Para 30  Other Problems Melanie Pummel, RN; 07/04/2017 7:33 AM) Alcohol Abuse Cerebrovascular Accident Chronic Obstructive Lung Disease Home Oxygen Use Hypercholesterolemia Lump In Breast Myocardial infarction Oophorectomy     Review of Systems Melanie Spillers Ledford RN; 07/04/2017 7:33 AM) General Not Present- Appetite Loss, Chills, Fatigue, Fever, Night Sweats, Weight Gain and Weight Loss. Skin Not Present- Change in Wart/Mole, Dryness, Hives, Jaundice, New Lesions, Non-Healing Wounds, Rash and Ulcer. HEENT Present- Hearing Loss, Ringing in the Ears and Wears glasses/contact lenses. Not Present- Earache, Hoarseness, Nose Bleed, Oral Ulcers, Seasonal Allergies, Sinus Pain, Sore Throat, Visual Disturbances and Yellow Eyes. Respiratory Present- Chronic Cough and Difficulty Breathing. Not Present- Bloody sputum, Snoring and Wheezing. Breast Present- Breast Mass and Breast Pain. Not Present- Nipple Discharge and Skin Changes. Cardiovascular Present- Difficulty Breathing Lying Down, Palpitations, Rapid Heart Rate and Shortness of Breath. Not Present- Chest Pain, Leg Cramps and Swelling of Extremities. Gastrointestinal Present- Constipation. Not Present- Abdominal Pain, Bloating, Bloody Stool, Change in Bowel Habits, Chronic diarrhea, Difficulty Swallowing, Excessive gas, Gets full quickly at meals, Hemorrhoids, Indigestion, Nausea, Rectal Pain and Vomiting. Female Genitourinary Present- Nocturia. Not Present- Frequency, Painful Urination, Pelvic Pain and Urgency. Musculoskeletal Not Present- Back Pain, Joint Pain, Joint Stiffness, Muscle Pain, Muscle Weakness and Swelling of Extremities. Neurological Present- Decreased Memory and Numbness. Not Present- Fainting, Headaches, Seizures, Tingling, Tremor, Trouble walking and Weakness. Psychiatric Present- Anxiety and Frequent crying. Not Present- Bipolar, Change in Sleep Pattern, Depression and Fearful. Endocrine Present- Heat  Intolerance. Not Present- Cold Intolerance, Excessive Hunger, Hair Changes, Hot flashes and New Diabetes. Hematology Present- Easy Bruising. Not Present- Blood Thinners, Excessive bleeding, Gland problems, HIV and Persistent Infections.   Physical Exam (Melanie Mccary A. Sharmarke Cicio MD; 07/04/2017 3:05 PM)  General Mental Status-Alert. General Appearance-Consistent with stated age. Hydration-Well hydrated. Voice-Normal.  Head and  Neck Head-normocephalic, atraumatic with no lesions or palpable masses. Trachea-midline. Thyroid Gland Characteristics - normal size and consistency.  Chest and Lung Exam Chest and lung exam reveals -quiet, even and easy respiratory effort with no use of accessory muscles and on auscultation, normal breath sounds, no adventitious sounds and normal vocal resonance. Inspection Chest Wall - Normal. Back - normal.  Breast Note: bruising noted to bilateral breasts. No masses palpated.  Abdomen Inspection Inspection of the abdomen reveals - No Hernias. Skin - Scar - no surgical scars. Palpation/Percussion Palpation and Percussion of the abdomen reveal - Soft, Non Tender, No Rebound tenderness, No Rigidity (guarding) and No hepatosplenomegaly. Auscultation Auscultation of the abdomen reveals - Bowel sounds normal.  Neurologic Neurologic evaluation reveals -alert and oriented x 3 with no impairment of recent or remote memory. Mental Status-Normal.  Lymphatic Head & Neck  General Head & Neck Lymphatics: Bilateral - Description - Normal. Axillary  General Axillary Region: Bilateral - Description - Normal. Tenderness - Non Tender.    Assessment & Plan (Melanie Birkel A. Chelesea Weiand MD; 07/04/2017 3:10 PM)  BREAST CANCER, LEFT (C50.912) Impression: discussed options and patient is opted for left breast needle localized lumpectomy due to iodine allergy lymph node mapping.my with left axillary sentinel lymph node mapping. Risk of lumpectomy include bleeding,  infection, seroma, more surgery, use of seed/wire, wound care, cosmetic deformity and the need for other treatments, death , blood clots, death. Pt agrees to proceed. Risk of sentinel lymph node mapping include bleeding, infection, lymphedema, shoulder pain. stiffness, dye allergy. cosmetic deformity , blood clots, death, need for more surgery. Pt agres to proceed.  Current Plans You are being scheduled for surgery- Our schedulers will call you.  You should hear from our office's scheduling department within 5 working days about the location, date, and time of surgery. We try to make accommodations for patient's preferences in scheduling surgery, but sometimes the OR schedule or the surgeon's schedule prevents [place] from making those accommodations.  If you have not heard from our office [phone]) in 5 working days, call the office and ask for your surgeon's nurse.  If you have other questions about your diagnosis, plan, or surgery, call the office and ask for your surgeon's nurse.  Pt Education - [facility] Breast Cancer Information Given - [facility] "Breast Journey" Package We discussed the staging and pathophysiology of breast cancer. We discussed all of the different options for treatment for breast cancer including surgery, chemotherapy, radiation therapy, Herceptin, and antiestrogen therapy. We discussed a sentinel lymph node biopsy as she does not appear to having lymph node involvement right now. We discussed the performance of that with injection of radioactive tracer and blue dye. We discussed that she would have an incision underneath her axillary hairline. We discussed that there is a bout a 10-20% chance of having a positive node with a sentinel lymph node biopsy and we will await the permanent pathology to make any other first further decisions in terms of her treatment. One of these options might be to return to the operating room to perform an axillary lymph node dissection. We discussed  about a 1-2% risk lifetime of chronic shoulder pain as well as lymphedema associated with a sentinel lymph node biopsy. We discussed the options for treatment of the breast cancer which included lumpectomy versus a mastectomy. We discussed the performance of the lumpectomy with a wire placement. We discussed a 10-20% chance of a positive margin requiring reexcision in the operating room. We also discussed that she  may need radiation therapy or antiestrogen therapy or both if she undergoes lumpectomy. We discussed the mastectomy and the postoperative care for that as well. We discussed that there is no difference in her survival whether she undergoes lumpectomy with radiation therapy or antiestrogen therapy versus a mastectomy. There is a slight difference in the local recurrence rate being 3-5% with lumpectomy and about 1% with a mastectomy. We discussed the risks of operation including bleeding, infection, possible reoperation. She understands her further therapy will be based on what her stages at the time of her operation.  Pt Education - [facility] breast cancer surgery: discussed with patient and provided information. Pt Education - [facility] Breast Biopsy HCI: discussed with patient and provided information. Pt Education - ABC (After Breast Cancer) Class Info: discussed with patient and provided information. BREAST NEOPLASM, TIS (DCIS), RIGHT (D05.11) Impression: discussed options of COMET vs lumpectomy. She has opted for lumpectomy on the right. Risk of lumpectomy include bleeding, infection, seroma, more surgery, use of seed/wire, wound care, cosmetic deformity and the need for other treatments, death , blood clots, death. Pt agrees to proceed.

## 2017-07-04 NOTE — Progress Notes (Signed)
Nutrition Assessment  Reason for Assessment:  Pt seen in Breast Clinic  ASSESSMENT:   71 year old female with new diagnosis of breast cancer.  Past medical history of HTN, CHF, COPD, prediabetes.   Patient reports good appetite.  Medications:  reviewed  Labs: reviewed  Anthropometrics:   Height: 63 inches Weight: 227 lb BMI: 40.2   NUTRITION DIAGNOSIS: Food and nutrition related knowledge deficit related to new diagnosis of breast cancer as evidenced by no prior need for nutrition related information.  INTERVENTION:   Discussed and provided packet of information regarding nutritional tips for breast cancer patients.  Questions answered.  Teachback method used.  Contact information provided and patient knows to contact me with questions/concerns.    MONITORING, EVALUATION, and GOAL: Pt will consume a healthy plant based diet to maintain lean body mass throughout treatment.   Jenelle Drennon B. Zenia Resides, [place], [place] Registered Dietitian [phone] (pager)
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
## 2017-07-05 ENCOUNTER — Encounter: Payer: Self-pay | Admitting: Hematology and Oncology

## 2017-07-05 ENCOUNTER — Telehealth: Payer: Self-pay | Admitting: Hematology and Oncology

## 2017-07-05 NOTE — Telephone Encounter (Signed)
Per 10/10 no los at check out °

## 2017-07-05 NOTE — Assessment & Plan Note (Signed)
06/26/2017: Screening detected right breast calcifications and architectural distortion on the left breast; right breast calcs 1.1 cm normal biopsy DCIS ER 90%, PR 95%, grade 1-2; Tis Nx stage 0 left breast distortion 1.8 cm mass by ultrasound biopsy: IDC with DCIS ER 95%, PR 5%, Ki-67 15%, HER-2 negative ratio 1.39, grade 1-2, T1c N0 stage IA clinical stage  Pathology and radiology counseling: Discussed with the patient, the details of pathology including the type of breast cancer,the clinical staging, the significance of ER, PR and HER-2/neu receptors and the implications for treatment. After reviewing the pathology in detail, we proceeded to discuss the different treatment options between surgery, radiation, chemotherapy, antiestrogen therapies.  Recommendation: 1. Bilateral breast conserving surgeries 2. adjuvant radiation therapy 3. Followed by adjuvant antiestrogen therapy  Patient is not a good candidate for systemic chemotherapy and hence we elected not to perform any molecular testing like Oncotype DX.  Return to clinic after surgery to discuss the pathology report
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
## 2017-07-06 ENCOUNTER — Encounter: Payer: Self-pay | Admitting: General Practice

## 2017-07-06 NOTE — Progress Notes (Signed)
[facility] Psychosocial Distress Screening Spiritual Care  Met with Melanie Schneider and [name] in [facility] to introduce [facility] team/resources, reviewing distress screen per protocol.  The patient scored a 8 on the Psychosocial Distress Thermometer which indicates severe distress. Also assessed for distress and other psychosocial needs.   ONCBCN DISTRESS SCREENING 07/06/2017  Screening Type Initial Screening  Distress experienced in past week (1-10) 8  Practical problem type Insurance  Family Problem type Partner;Children  Emotional problem type Depression;Nervousness/Anxiety;Feeling hopeless  Spiritual/Religous concerns type Facing my mortality  Physical Problem type Sleep/insomnia;Breathing  Referral to support programs Yes   Per pt, some of her initial anxiety is reduced now that she understands more about the scope of her dx and tx plan.  Provided pastoral presence, reflective listening, normalization of feelings, and introduction to full range of support resources available.   Follow up needed: No. Referring for [facility] per pt request.  Per pt, no other needs at this time, but she knows to contact team with any future needs, questions, or concerns.   [place], [place], [facility] Pager [phone] Voicemail [phone]

## 2017-07-09 ENCOUNTER — Telehealth: Payer: Self-pay | Admitting: *Deleted

## 2017-07-09 NOTE — Telephone Encounter (Signed)
  Oncology Nurse Navigator Documentation  Navigator Location: [facility] (07/09/17 1400)   )Navigator Encounter Type: Telephone (07/09/17 1400) Telephone: Outgoing Call;Clinic/[facility] Follow-up (07/09/17 1400)                                                  Time Spent with Patient: 15 (07/09/17 1400)

## 2017-07-12 ENCOUNTER — Other Ambulatory Visit (HOSPITAL_COMMUNITY): Payer: Self-pay | Admitting: Neurology

## 2017-07-16 ENCOUNTER — Telehealth: Payer: Self-pay | Admitting: Hematology and Oncology

## 2017-07-16 NOTE — Telephone Encounter (Signed)
Called patient regarding 11/6

## 2017-07-17 ENCOUNTER — Encounter (HOSPITAL_BASED_OUTPATIENT_CLINIC_OR_DEPARTMENT_OTHER): Payer: Self-pay | Admitting: *Deleted

## 2017-07-17 NOTE — Pre-Procedure Instructions (Signed)
To come pick up Ensure pre-surgery drink 10 oz. - to drink by 0900 DOS

## 2017-07-18 NOTE — Progress Notes (Signed)
Pt here for Ensure presurgery drink. Taped instructions to bottle to remind pt to drink by 0900 on DOS. Also gave pt bottle of hibiclens soap and instructed her to shower using half night before and half morning of surgery. Warned her shower floor may become slick with this soap and to use caution while showering. Pt verbalized understanding of all instructions.

## 2017-07-19 ENCOUNTER — Encounter: Payer: Self-pay | Admitting: Radiation Oncology

## 2017-07-19 ENCOUNTER — Other Ambulatory Visit: Payer: Self-pay | Admitting: *Deleted

## 2017-07-19 DIAGNOSIS — C50212 Malignant neoplasm of upper-inner quadrant of left female breast: Secondary | ICD-10-CM

## 2017-07-19 DIAGNOSIS — Z170 Estrogen receptor positive status [ER+]: Principal | ICD-10-CM

## 2017-07-23 ENCOUNTER — Encounter (HOSPITAL_BASED_OUTPATIENT_CLINIC_OR_DEPARTMENT_OTHER): Payer: Self-pay

## 2017-07-24 ENCOUNTER — Ambulatory Visit (HOSPITAL_BASED_OUTPATIENT_CLINIC_OR_DEPARTMENT_OTHER)
Admission: RE | Admit: 2017-07-24 | Discharge: 2017-07-24 | Disposition: A | Discharge status: Home / Self Care | Payer: PPO | Source: Ambulatory Visit | Attending: Surgery | Admitting: Surgery

## 2017-07-24 ENCOUNTER — Ambulatory Visit (HOSPITAL_COMMUNITY): Payer: PPO

## 2017-07-24 ENCOUNTER — Encounter (HOSPITAL_BASED_OUTPATIENT_CLINIC_OR_DEPARTMENT_OTHER): Payer: Self-pay | Admitting: *Deleted

## 2017-07-24 ENCOUNTER — Ambulatory Visit (HOSPITAL_BASED_OUTPATIENT_CLINIC_OR_DEPARTMENT_OTHER): Payer: PPO | Admitting: Certified Registered"

## 2017-07-24 ENCOUNTER — Encounter (HOSPITAL_BASED_OUTPATIENT_CLINIC_OR_DEPARTMENT_OTHER)
Admission: RE | Disposition: A | Discharge status: Home / Self Care | Payer: Self-pay | Source: Ambulatory Visit | Attending: Surgery

## 2017-07-24 DIAGNOSIS — J449 Chronic obstructive pulmonary disease, unspecified: Secondary | ICD-10-CM | POA: Diagnosis not present

## 2017-07-24 DIAGNOSIS — F418 Other specified anxiety disorders: Secondary | ICD-10-CM | POA: Insufficient documentation

## 2017-07-24 DIAGNOSIS — C50412 Malignant neoplasm of upper-outer quadrant of left female breast: Secondary | ICD-10-CM | POA: Diagnosis not present

## 2017-07-24 DIAGNOSIS — C50812 Malignant neoplasm of overlapping sites of left female breast: Secondary | ICD-10-CM | POA: Insufficient documentation

## 2017-07-24 DIAGNOSIS — Z170 Estrogen receptor positive status [ER+]: Secondary | ICD-10-CM | POA: Diagnosis not present

## 2017-07-24 DIAGNOSIS — G8918 Other acute postprocedural pain: Secondary | ICD-10-CM | POA: Diagnosis not present

## 2017-07-24 DIAGNOSIS — D0511 Intraductal carcinoma in situ of right breast: Secondary | ICD-10-CM | POA: Insufficient documentation

## 2017-07-24 DIAGNOSIS — C50411 Malignant neoplasm of upper-outer quadrant of right female breast: Secondary | ICD-10-CM | POA: Diagnosis not present

## 2017-07-24 DIAGNOSIS — Z6841 Body Mass Index (BMI) 40.0 and over, adult: Secondary | ICD-10-CM | POA: Insufficient documentation

## 2017-07-24 DIAGNOSIS — C50212 Malignant neoplasm of upper-inner quadrant of left female breast: Secondary | ICD-10-CM | POA: Diagnosis not present

## 2017-07-24 DIAGNOSIS — D0591 Unspecified type of carcinoma in situ of right breast: Secondary | ICD-10-CM | POA: Diagnosis not present

## 2017-07-24 DIAGNOSIS — I10 Essential (primary) hypertension: Secondary | ICD-10-CM | POA: Diagnosis not present

## 2017-07-24 DIAGNOSIS — C50912 Malignant neoplasm of unspecified site of left female breast: Secondary | ICD-10-CM | POA: Diagnosis not present

## 2017-07-24 HISTORY — DX: Family history of other specified conditions: Z84.89

## 2017-07-24 HISTORY — DX: Personal history of transient ischemic attack (TIA), and cerebral infarction without residual deficits: Z86.73

## 2017-07-24 HISTORY — DX: Intraductal carcinoma in situ of right breast: D05.11

## 2017-07-24 HISTORY — PX: BREAST LUMPECTOMY WITH NEEDLE LOCALIZATION AND AXILLARY SENTINEL LYMPH NODE BX: SHX5760

## 2017-07-24 HISTORY — DX: Malignant neoplasm of unspecified site of left female breast: C50.912

## 2017-07-24 SURGERY — BREAST LUMPECTOMY WITH NEEDLE LOCALIZATION AND AXILLARY SENTINEL LYMPH NODE BX
Anesthesia: Regional | Site: Breast | Laterality: Bilateral

## 2017-07-24 MED ORDER — CELECOXIB 200 MG PO CAPS
200.0000 mg | ORAL_CAPSULE | ORAL | Status: AC
  Administered 2017-07-24: 200 mg via ORAL

## 2017-07-24 MED ORDER — MIDAZOLAM HCL 2 MG/2ML IJ SOLN
INTRAMUSCULAR | Status: AC
  Filled 2017-07-24: qty 2

## 2017-07-24 MED ORDER — CHLORHEXIDINE GLUCONATE CLOTH 2 % EX PADS: 6.0000 | MEDICATED_PAD | Freq: Once | CUTANEOUS | Status: DC

## 2017-07-24 MED ORDER — CELECOXIB 200 MG PO CAPS
ORAL_CAPSULE | ORAL | Status: AC
  Filled 2017-07-24: qty 1

## 2017-07-24 MED ORDER — LIDOCAINE HCL (CARDIAC) 20 MG/ML IV SOLN
INTRAVENOUS | Status: DC | PRN
  Administered 2017-07-24: 30 mg via INTRAVENOUS

## 2017-07-24 MED ORDER — MIDAZOLAM HCL 2 MG/2ML IJ SOLN: 1.0000 mg | INTRAMUSCULAR | Status: DC | PRN

## 2017-07-24 MED ORDER — ONDANSETRON HCL 4 MG/2ML IJ SOLN: 4.0000 mg | Freq: Once | INTRAMUSCULAR | Status: DC | PRN

## 2017-07-24 MED ORDER — SCOPOLAMINE 1 MG/3DAYS TD PT72: 1.0000 | MEDICATED_PATCH | Freq: Once | TRANSDERMAL | Status: DC | PRN

## 2017-07-24 MED ORDER — SUCCINYLCHOLINE CHLORIDE 200 MG/10ML IV SOSY
PREFILLED_SYRINGE | INTRAVENOUS | Status: AC
  Filled 2017-07-24: qty 10

## 2017-07-24 MED ORDER — LIDOCAINE 2% (20 MG/ML) 5 ML SYRINGE
INTRAMUSCULAR | Status: AC
  Filled 2017-07-24: qty 25

## 2017-07-24 MED ORDER — LACTATED RINGERS IV SOLN
INTRAVENOUS | Status: DC
  Administered 2017-07-24 (×2): via INTRAVENOUS

## 2017-07-24 MED ORDER — PROPOFOL 10 MG/ML IV BOLUS
INTRAVENOUS | Status: DC | PRN
  Administered 2017-07-24: 100 mg via INTRAVENOUS

## 2017-07-24 MED ORDER — FENTANYL CITRATE (PF) 100 MCG/2ML IJ SOLN
INTRAMUSCULAR | Status: AC
  Filled 2017-07-24: qty 2

## 2017-07-24 MED ORDER — ONDANSETRON HCL 4 MG/2ML IJ SOLN
INTRAMUSCULAR | Status: AC
  Filled 2017-07-24: qty 24

## 2017-07-24 MED ORDER — GABAPENTIN 300 MG PO CAPS
ORAL_CAPSULE | ORAL | Status: AC
  Filled 2017-07-24: qty 1

## 2017-07-24 MED ORDER — GABAPENTIN 300 MG PO CAPS
300.0000 mg | ORAL_CAPSULE | ORAL | Status: AC
  Administered 2017-07-24: 300 mg via ORAL

## 2017-07-24 MED ORDER — CEFAZOLIN SODIUM-DEXTROSE 2-4 GM/100ML-% IV SOLN
2.0000 g | INTRAVENOUS | Status: AC
  Administered 2017-07-24: 2 g via INTRAVENOUS

## 2017-07-24 MED ORDER — HYDROCODONE-ACETAMINOPHEN 5-325 MG PO TABS: 1.0000 | ORAL_TABLET | Freq: Four times a day (QID) | ORAL | 0 refills | Status: DC | PRN

## 2017-07-24 MED ORDER — CEFAZOLIN SODIUM-DEXTROSE 2-4 GM/100ML-% IV SOLN
INTRAVENOUS | Status: AC
  Filled 2017-07-24: qty 100

## 2017-07-24 MED ORDER — DEXTROSE 5 % IV SOLN: 3.0000 g | INTRAVENOUS | Status: DC

## 2017-07-24 MED ORDER — SODIUM CHLORIDE 0.9 % IJ SOLN
INTRAVENOUS | Status: DC | PRN
  Administered 2017-07-24: 5 mL via INTRAMUSCULAR

## 2017-07-24 MED ORDER — EPHEDRINE SULFATE 50 MG/ML IJ SOLN
INTRAMUSCULAR | Status: DC | PRN
  Administered 2017-07-24 (×3): 10 mg via INTRAVENOUS

## 2017-07-24 MED ORDER — FENTANYL CITRATE (PF) 100 MCG/2ML IJ SOLN
25.0000 ug | INTRAMUSCULAR | Status: DC | PRN
  Administered 2017-07-24: 25 ug via INTRAVENOUS
  Administered 2017-07-24: 50 ug via INTRAVENOUS

## 2017-07-24 MED ORDER — EPHEDRINE 5 MG/ML INJ
INTRAVENOUS | Status: AC
  Filled 2017-07-24: qty 60

## 2017-07-24 MED ORDER — ACETAMINOPHEN 500 MG PO TABS
1000.0000 mg | ORAL_TABLET | ORAL | Status: AC
  Administered 2017-07-24: 1000 mg via ORAL

## 2017-07-24 MED ORDER — ROPIVACAINE HCL 7.5 MG/ML IJ SOLN
INTRAMUSCULAR | Status: DC | PRN
  Administered 2017-07-24: 20 mL via PERINEURAL

## 2017-07-24 MED ORDER — BUPIVACAINE-EPINEPHRINE 0.25% -1:200000 IJ SOLN
INTRAMUSCULAR | Status: DC | PRN
  Administered 2017-07-24: 20 mL

## 2017-07-24 MED ORDER — PROPOFOL 500 MG/50ML IV EMUL
INTRAVENOUS | Status: AC
  Filled 2017-07-24: qty 100

## 2017-07-24 MED ORDER — DEXAMETHASONE SODIUM PHOSPHATE 4 MG/ML IJ SOLN
INTRAMUSCULAR | Status: DC | PRN
  Administered 2017-07-24: 4 mg via INTRAVENOUS

## 2017-07-24 MED ORDER — ONDANSETRON HCL 4 MG/2ML IJ SOLN
INTRAMUSCULAR | Status: DC | PRN
  Administered 2017-07-24: 4 mg via INTRAVENOUS

## 2017-07-24 MED ORDER — FENTANYL CITRATE (PF) 100 MCG/2ML IJ SOLN
50.0000 ug | INTRAMUSCULAR | Status: DC | PRN
  Administered 2017-07-24 (×2): 50 ug via INTRAVENOUS

## 2017-07-24 MED ORDER — SCOPOLAMINE 1 MG/3DAYS TD PT72
MEDICATED_PATCH | TRANSDERMAL | Status: AC
  Filled 2017-07-24: qty 1

## 2017-07-24 MED ORDER — ACETAMINOPHEN 500 MG PO TABS
ORAL_TABLET | ORAL | Status: AC
  Filled 2017-07-24: qty 2

## 2017-07-24 MED ORDER — DEXAMETHASONE SODIUM PHOSPHATE 10 MG/ML IJ SOLN
INTRAMUSCULAR | Status: AC
  Filled 2017-07-24: qty 5

## 2017-07-24 SURGICAL SUPPLY — 58 items
ADH SKN CLS APL DERMABOND .7 (GAUZE/BANDAGES/DRESSINGS) ×2
APPLIER CLIP 9.375 MED OPEN (MISCELLANEOUS) ×2
APR CLP MED 9.3 20 MLT OPN (MISCELLANEOUS) ×1
BINDER BREAST LRG (GAUZE/BANDAGES/DRESSINGS) IMPLANT
BINDER BREAST MEDIUM (GAUZE/BANDAGES/DRESSINGS) IMPLANT
BINDER BREAST XLRG (GAUZE/BANDAGES/DRESSINGS) IMPLANT
BINDER BREAST XXLRG (GAUZE/BANDAGES/DRESSINGS) ×1 IMPLANT
BLADE CLIPPER SURG (BLADE) IMPLANT
BLADE SURG 15 STRL LF DISP TIS (BLADE) ×1 IMPLANT
BLADE SURG 15 STRL SS (BLADE) ×2
CANISTER SUCT 1200ML W/VALVE (MISCELLANEOUS) ×2 IMPLANT
CHLORAPREP W/TINT 26ML (MISCELLANEOUS) ×2 IMPLANT
CLIP APPLIE 9.375 MED OPEN (MISCELLANEOUS) ×1 IMPLANT
COVER BACK TABLE 60X90IN (DRAPES) ×2 IMPLANT
COVER MAYO STAND STRL (DRAPES) ×2 IMPLANT
COVER PROBE W GEL 5X96 (DRAPES) ×2 IMPLANT
DECANTER SPIKE VIAL GLASS SM (MISCELLANEOUS) IMPLANT
DERMABOND ADVANCED (GAUZE/BANDAGES/DRESSINGS) ×2
DERMABOND ADVANCED .7 DNX12 (GAUZE/BANDAGES/DRESSINGS) ×2 IMPLANT
DEVICE DUBIN W/COMP PLATE 8390 (MISCELLANEOUS) ×2 IMPLANT
DRAPE LAPAROSCOPIC ABDOMINAL (DRAPES) ×2 IMPLANT
DRAPE UTILITY XL STRL (DRAPES) ×2 IMPLANT
ELECT COATED BLADE 2.86 ST (ELECTRODE) ×2 IMPLANT
ELECT REM PT RETURN 9FT ADLT (ELECTROSURGICAL) ×2
ELECTRODE REM PT RTRN 9FT ADLT (ELECTROSURGICAL) ×1 IMPLANT
GAUZE SPONGE 4X4 12PLY STRL LF (GAUZE/BANDAGES/DRESSINGS) IMPLANT
GLOVE BIOGEL PI IND STRL 8 (GLOVE) ×1 IMPLANT
GLOVE BIOGEL PI INDICATOR 8 (GLOVE) ×1
GLOVE ECLIPSE 8.0 STRL XLNG CF (GLOVE) ×1 IMPLANT
GLOVE SURG SS PI 6.5 STRL IVOR (GLOVE) ×1 IMPLANT
GLOVE SURG SS PI 7.0 STRL IVOR (GLOVE) ×1 IMPLANT
GLOVE SURG SS PI 8.0 STRL IVOR (GLOVE) ×1 IMPLANT
GOWN STRL REUS W/ TWL LRG LVL3 (GOWN DISPOSABLE) ×2 IMPLANT
GOWN STRL REUS W/TWL LRG LVL3 (GOWN DISPOSABLE) ×4
HEMOSTAT ARISTA ABSORB 3G PWDR (MISCELLANEOUS) IMPLANT
HEMOSTAT SNOW SURGICEL 2X4 (HEMOSTASIS) IMPLANT
HEMOSTAT SURGICEL 2X14 (HEMOSTASIS) IMPLANT
KIT MARKER MARGIN INK (KITS) ×2 IMPLANT
NDL HYPO 25X1 1.5 SAFETY (NEEDLE) ×1 IMPLANT
NDL SAFETY ECLIPSE 18X1.5 (NEEDLE) IMPLANT
NEEDLE HYPO 18GX1.5 SHARP (NEEDLE)
NEEDLE HYPO 25X1 1.5 SAFETY (NEEDLE) ×2 IMPLANT
NS IRRIG 1000ML POUR BTL (IV SOLUTION) ×2 IMPLANT
PACK BASIN DAY SURGERY FS (CUSTOM PROCEDURE TRAY) ×2 IMPLANT
PENCIL BUTTON HOLSTER BLD 10FT (ELECTRODE) ×2 IMPLANT
SLEEVE SCD COMPRESS KNEE MED (MISCELLANEOUS) ×2 IMPLANT
SPONGE LAP 18X18 X RAY DECT (DISPOSABLE) IMPLANT
SPONGE LAP 4X18 X RAY DECT (DISPOSABLE) ×3 IMPLANT
STAPLER VISISTAT 35W (STAPLE) IMPLANT
SUT MNCRL AB 3-0 PS2 18 (SUTURE) ×2 IMPLANT
SUT SILK 2 0 SH (SUTURE) IMPLANT
SUT VICRYL 3-0 CR8 SH (SUTURE) ×2 IMPLANT
SYR BULB 3OZ (MISCELLANEOUS) ×2 IMPLANT
SYR CONTROL 10ML LL (SYRINGE) ×4 IMPLANT
TOWEL OR 17X24 6PK STRL BLUE (TOWEL DISPOSABLE) ×2 IMPLANT
TOWEL OR NON WOVEN STRL DISP B (DISPOSABLE) IMPLANT
TUBE CONNECTING 20X1/4 (TUBING) ×2 IMPLANT
YANKAUER SUCT BULB TIP NO VENT (SUCTIONS) ×2 IMPLANT

## 2017-07-24 NOTE — Anesthesia Procedure Notes (Signed)
Anesthesia Regional Block: Pectoralis block   Pre-Anesthetic Checklist: ,, timeout performed, Correct Patient, Correct Site, Correct Laterality, Correct Procedure,, site marked, risks and benefits discussed, Surgical consent,  Pre-op evaluation,  At surgeon's request and post-op pain management  Laterality: Left  Prep: chloraprep       Needles:  Injection technique: Single-shot  Needle Type: Echogenic Stimulator Needle     Needle Length: 9cm  Needle Gauge: 21     Additional Needles:   Procedures:,,,, ultrasound used (permanent image in chart),,,,  Narrative:  Start time: 07/24/2017 11:30 AM End time: 07/24/2017 11:40 AM Injection made incrementally with aspirations every 5 mL.  Performed by: Personally  Anesthesiologist: Adele Barthel P  Additional Notes: Functioning IV was confirmed and monitors were applied.  A 80mm 21ga Arrow echogenic stimulator needle was used. Sterile prep, hand hygiene and sterile gloves were used.  Negative aspiration and negative test dose prior to incremental administration of local anesthetic. The patient tolerated the procedure well.

## 2017-07-24 NOTE — Transfer of Care (Signed)
Immediate Anesthesia Transfer of Care Note  Patient: Melanie Schneider  Procedure(s) Performed: BILATERAL BREAST LUMPECTOMY WITH BILATERAL NEEDLE LOCALIZATION AND LEFT AXILLARY SENTINEL LYMPH NODE BX (Bilateral Breast)  Patient Location: PACU  Anesthesia Type:GA combined with regional for post-op pain  Level of Consciousness: awake, alert , oriented and patient cooperative  Airway & Oxygen Therapy: Patient Spontanous Breathing and Patient connected to face mask oxygen  Post-op Assessment: Report given to RN and Post -op Vital signs reviewed and stable  Post vital signs: Reviewed and stable  Last Vitals:  Vitals:   07/24/17 1143 07/24/17 1149  BP:    Pulse: (!) 49 (!) 50  Resp: 14 (!) 21  Temp:    SpO2: 100% 100%    Last Pain:  Vitals:   07/24/17 1105  PainSc: 0-No pain         Complications: No apparent anesthesia complications

## 2017-07-24 NOTE — Op Note (Addendum)
Preoperative diagnosis: Stage I left breast cancer upper outer quadrant and DCIS right breast central   Postoperative diagnosis: Same  Procedure: Bilateral breast wire localized partial mastectomy with deep  left axillary sentinel lymph node mapping using methylene blue dye  Surgeon: Erroll Luna MD  Anesthesia: General with regional block and local anesthetic  EBL: 30 cc  Specimens: Bilateral breast masses with wire and clip verified by x-ray and 3 left axillary sentinel nodes blue  Drains: None  IV fluids: Per anesthesia record  Indications for procedure: The patient is a 71 year old female found to have a left breast mass by mammography.  Core biopsy was done which showed invasive ductal carcinoma.  She also had an area of calcification of the right breast core biopsy proven to be low-grade DCIS.  Options of treatment were discussed with the patient.  She desire bilateral lumpectomy.  I discussed sentinel lymph node mapping as well with her.The procedure has been discussed with the patient. Alternatives to surgery have been discussed with the patient.  Risks of surgery include bleeding,  Infection,  Seroma formation, death,  and the need for further surgery.   The patient understands and wishes to proceed.Sentinel lymph node mapping and dissection has been discussed with the patient.  Risk of bleeding,  Infection,  Seroma formation,  Additional procedures,,  Shoulder weakness ,  Shoulder stiffness,  Nerve and blood vessel injury and reaction to the mapping dyes have been discussed.  Alternatives to surgery have been discussed with the patient.  The patient agrees to proceed.        Description of procedure: The patient underwent bilateral wire localization is now.  She was brought to the holding area.  Questions were answered.  She was taken back to the operating room and placed supine on the operating room table.  After induction of general anesthesia both breasts were prepped and  draped in sterile fashion.  The wires were trimmed.  The left breast was done first.  A curvilinear incision was made in the left upper breast.  Dissection was carried around the wire and all tissue excised.  Radiograph revealed the wire clip to be in the specimen.  Margins were grossly negative.  An additional inferior and anterior margin were taken since these appear to be close on the specimen radiograph.  Hemostasis was achieved.  Clips were placed.  The incision was closed with 3-0 Vicryl and 4-0 Monocryl.  The sentinel node was done next.  Neoprobe was used there is no signal in the left breast.  I used 4 cc of methylene blue dye and injected in a subareolar position.  This was massaged for 5 minutes.  Incision was then made in the left axilla.  Dissection was carried down the multiple blue lymphatics were identified.  These were traced back and identified 3 blue sentinel nodes removed these which were in the deep axilla.  The remainder of the dissection of the axilla revealed no evidence of any other blue nodes.  Hemostasis was achieved.  This wound was closed with 3-0 Vicryl and 4-0 Monocryl.  The right breast lumpectomy was then done.  The wire was in the central right breast.  A curvilinear incision was made along the inferior border of the nipple areolar complex.  Dissection was carried in all tissue around the tip of the wire was excised.  Radiograph revealed both the wire and clip with calcifications to be present.  The margins were grossly negative grossly normal.  The wound was  made hemostatic with cautery.  It was closed with 3-0 Vicryl and 4-0 Monocryl.  Liquid adhesive applied to all incisions.  All final counts were found to be correct.  Breast binder placed.  The patient was awoke extubated taken to recovery in satisfactory condition.

## 2017-07-24 NOTE — H&P (View-Only) (Signed)
Melanie Schneider 07/04/2017 7:32 AM Location: [facility] Patient #: [id number] DOB: [birth year] Undefined / Language: Melanie Schneider / Race: White Female  History of Present Illness Melanie Schneider A. Melanie Kissinger MD; 07/04/2017 3:04 PM) Patient words: patient presents at the request of Dr. Lisbeth Schneider for bilateral breast abnormalities on mammography. On her left breast a 1.8 cm mass in the upper outer quadrant was found in core biopsy showed invasive ductal carcinoma with DCIS ER positive PR positive HER-2/neu negative with a Ki-67 of 15%. A right breast abnorma 1.1 cm. noted cluster of calcifications measuring 1.1 cm. Core biopsy showed DCIS grade 1 to grade 2 ER positive PR positive. Patient denieothny abnormality with either breast.discharge or other abnormality with either breast.  The patient is a 71 year old female.   Past Surgical History Melanie Pummel, RN; 07/04/2017 7:33 AM) Breast Biopsy Bilateral. Hysterectomy (not due to cancer) - Complete Tonsillectomy  Diagnostic Studies History Melanie Pummel, RN; 07/04/2017 7:33 AM) Colonoscopy 1-5 years ago Mammogram within last year Pap Smear >5 years ago  Medication History Melanie Pummel, RN; 07/04/2017 7:33 AM) Medications Reconciled  Social History Melanie Pummel, RN; 07/04/2017 7:33 AM) Alcohol use Remotely quit alcohol use. Caffeine use Carbonated beverages, Coffee. Illicit drug use Remotely quit drug use. Tobacco use Former smoker.  Family History Melanie Pummel, RN; 07/04/2017 7:33 AM) Alcohol Abuse Family Members In General. Anesthetic complications Father. Breast Cancer Family Members In General, Mother. Cancer Father. Melanoma Mother. Migraine Headache Mother.  Pregnancy / Birth History Melanie Pummel, RN; 07/04/2017 7:33 AM) Age at menarche 57 years. Age of menopause 51-55 Contraceptive History Intrauterine device, Oral contraceptives. Gravida 1 Length (months) of breastfeeding 3-6 Maternal  age 22-25 Para 29  Other Problems Melanie Pummel, RN; 07/04/2017 7:33 AM) Alcohol Abuse Cerebrovascular Accident Chronic Obstructive Lung Disease Home Oxygen Use Hypercholesterolemia Lump In Breast Myocardial infarction Oophorectomy     Review of Systems Melanie Spillers Ledford RN; 07/04/2017 7:33 AM) General Not Present- Appetite Loss, Chills, Fatigue, Fever, Night Sweats, Weight Gain and Weight Loss. Skin Not Present- Change in Wart/Mole, Dryness, Hives, Jaundice, New Lesions, Non-Healing Wounds, Rash and Ulcer. HEENT Present- Hearing Loss, Ringing in the Ears and Wears glasses/contact lenses. Not Present- Earache, Hoarseness, Nose Bleed, Oral Ulcers, Seasonal Allergies, Sinus Pain, Sore Throat, Visual Disturbances and Yellow Eyes. Respiratory Present- Chronic Cough and Difficulty Breathing. Not Present- Bloody sputum, Snoring and Wheezing. Breast Present- Breast Mass and Breast Pain. Not Present- Nipple Discharge and Skin Changes. Cardiovascular Present- Difficulty Breathing Lying Down, Palpitations, Rapid Heart Rate and Shortness of Breath. Not Present- Chest Pain, Leg Cramps and Swelling of Extremities. Gastrointestinal Present- Constipation. Not Present- Abdominal Pain, Bloating, Bloody Stool, Change in Bowel Habits, Chronic diarrhea, Difficulty Swallowing, Excessive gas, Gets full quickly at meals, Hemorrhoids, Indigestion, Nausea, Rectal Pain and Vomiting. Female Genitourinary Present- Nocturia. Not Present- Frequency, Painful Urination, Pelvic Pain and Urgency. Musculoskeletal Not Present- Back Pain, Joint Pain, Joint Stiffness, Muscle Pain, Muscle Weakness and Swelling of Extremities. Neurological Present- Decreased Memory and Numbness. Not Present- Fainting, Headaches, Seizures, Tingling, Tremor, Trouble walking and Weakness. Psychiatric Present- Anxiety and Frequent crying. Not Present- Bipolar, Change in Sleep Pattern, Depression and Fearful. Endocrine Present- Heat  Intolerance. Not Present- Cold Intolerance, Excessive Hunger, Hair Changes, Hot flashes and New Diabetes. Hematology Present- Easy Bruising. Not Present- Blood Thinners, Excessive bleeding, Gland problems, HIV and Persistent Infections.   Physical Exam (Melanie Venditto A. Krikor Willet MD; 07/04/2017 3:05 PM)  General Mental Status-Alert. General Appearance-Consistent with stated age. Hydration-Well hydrated. Voice-Normal.  Head and  Neck Head-normocephalic, atraumatic with no lesions or palpable masses. Trachea-midline. Thyroid Gland Characteristics - normal size and consistency.  Chest and Lung Exam Chest and lung exam reveals -quiet, even and easy respiratory effort with no use of accessory muscles and on auscultation, normal breath sounds, no adventitious sounds and normal vocal resonance. Inspection Chest Wall - Normal. Back - normal.  Breast Note: bruising noted to bilateral breasts. No masses palpated.  Abdomen Inspection Inspection of the abdomen reveals - No Hernias. Skin - Scar - no surgical scars. Palpation/Percussion Palpation and Percussion of the abdomen reveal - Soft, Non Tender, No Rebound tenderness, No Rigidity (guarding) and No hepatosplenomegaly. Auscultation Auscultation of the abdomen reveals - Bowel sounds normal.  Neurologic Neurologic evaluation reveals -alert and oriented x 3 with no impairment of recent or remote memory. Mental Status-Normal.  Lymphatic Head & Neck  General Head & Neck Lymphatics: Bilateral - Description - Normal. Axillary  General Axillary Region: Bilateral - Description - Normal. Tenderness - Non Tender.    Assessment & Plan (Melanie Choy A. Emojean Gertz MD; 07/04/2017 3:10 PM)  BREAST CANCER, LEFT (C50.912) Impression: discussed options and patient is opted for left breast needle localized lumpectomy due to iodine allergy lymph node mapping.my with left axillary sentinel lymph node mapping. Risk of lumpectomy include bleeding,  infection, seroma, more surgery, use of seed/wire, wound care, cosmetic deformity and the need for other treatments, death , blood clots, death. Pt agrees to proceed. Risk of sentinel lymph node mapping include bleeding, infection, lymphedema, shoulder pain. stiffness, dye allergy. cosmetic deformity , blood clots, death, need for more surgery. Pt agres to proceed.  Current Plans You are being scheduled for surgery- Our schedulers will call you.  You should hear from our office's scheduling department within 5 working days about the location, date, and time of surgery. We try to make accommodations for patient's preferences in scheduling surgery, but sometimes the OR schedule or the surgeon's schedule prevents [place] from making those accommodations.  If you have not heard from our office [phone]) in 5 working days, call the office and ask for your surgeon's nurse.  If you have other questions about your diagnosis, plan, or surgery, call the office and ask for your surgeon's nurse.  Pt Education - [facility] Breast Cancer Information Given - [facility] "Breast Journey" Package We discussed the staging and pathophysiology of breast cancer. We discussed all of the different options for treatment for breast cancer including surgery, chemotherapy, radiation therapy, Herceptin, and antiestrogen therapy. We discussed a sentinel lymph node biopsy as she does not appear to having lymph node involvement right now. We discussed the performance of that with injection of radioactive tracer and blue dye. We discussed that she would have an incision underneath her axillary hairline. We discussed that there is a bout a 10-20% chance of having a positive node with a sentinel lymph node biopsy and we will await the permanent pathology to make any other first further decisions in terms of her treatment. One of these options might be to return to the operating room to perform an axillary lymph node dissection. We discussed  about a 1-2% risk lifetime of chronic shoulder pain as well as lymphedema associated with a sentinel lymph node biopsy. We discussed the options for treatment of the breast cancer which included lumpectomy versus a mastectomy. We discussed the performance of the lumpectomy with a wire placement. We discussed a 10-20% chance of a positive margin requiring reexcision in the operating room. We also discussed that she  may need radiation therapy or antiestrogen therapy or both if she undergoes lumpectomy. We discussed the mastectomy and the postoperative care for that as well. We discussed that there is no difference in her survival whether she undergoes lumpectomy with radiation therapy or antiestrogen therapy versus a mastectomy. There is a slight difference in the local recurrence rate being 3-5% with lumpectomy and about 1% with a mastectomy. We discussed the risks of operation including bleeding, infection, possible reoperation. She understands her further therapy will be based on what her stages at the time of her operation.  Pt Education - [facility] breast cancer surgery: discussed with patient and provided information. Pt Education - [facility] Breast Biopsy HCI: discussed with patient and provided information. Pt Education - ABC (After Breast Cancer) Class Info: discussed with patient and provided information. BREAST NEOPLASM, TIS (DCIS), RIGHT (D05.11) Impression: discussed options of COMET vs lumpectomy. She has opted for lumpectomy on the right. Risk of lumpectomy include bleeding, infection, seroma, more surgery, use of seed/wire, wound care, cosmetic deformity and the need for other treatments, death , blood clots, death. Pt agrees to proceed.

## 2017-07-24 NOTE — Interval H&P Note (Signed)
History and Physical Interval Note:  07/24/2017 12:27 PM  Melanie Schneider  has presented today for surgery, with the diagnosis of LEFT BREAST CANCER, RIGHT BREAST DCIS  The various methods of treatment have been discussed with the patient and family. After consideration of risks, benefits and other options for treatment, the patient has consented to  Procedure(s): BILATERAL BREAST LUMPECTOMY WITH BILATERAL NEEDLE LOCALIZATION AND LEFT AXILLARY SENTINEL LYMPH NODE BX (Bilateral) as a surgical intervention .  The patient's history has been reviewed, patient examined, no change in status, stable for surgery.  I have reviewed the patient's chart and labs.  Questions were answered to the patient's satisfaction.     [name]

## 2017-07-24 NOTE — Discharge Instructions (Signed)
°Post Anesthesia Home Care Instructions ° °Activity: °Get plenty of rest for the remainder of the day. A responsible individual must stay with you for 24 hours following the procedure.  °For the next 24 hours, DO NOT: °-Drive a car °-Operate machinery °-Drink alcoholic beverages °-Take any medication unless instructed by your physician °-Make any legal decisions or sign important papers. ° °Meals: °Start with liquid foods such as gelatin or soup. Progress to regular foods as tolerated. Avoid greasy, spicy, heavy foods. If nausea and/or vomiting occur, drink only clear liquids until the nausea and/or vomiting subsides. Call your physician if vomiting continues. ° °Special Instructions/Symptoms: °Your throat may feel dry or sore from the anesthesia or the breathing tube placed in your throat during surgery. If this causes discomfort, gargle with warm salt water. The discomfort should disappear within 24 hours. ° °If you had a scopolamine patch placed behind your ear for the management of post- operative nausea and/or vomiting: ° °1. The medication in the patch is effective for 72 hours, after which it should be removed.  Wrap patch in a tissue and discard in the trash. Wash hands thoroughly with soap and water. °2. You may remove the patch earlier than 72 hours if you experience unpleasant side effects which may include dry mouth, dizziness or visual disturbances. °3. Avoid touching the patch. Wash your hands with soap and water after contact with the patch. °  ° ° ° ° °[facility] °Office Phone Number [phone] ° °BREAST BIOPSY/ PARTIAL MASTECTOMY: POST OP INSTRUCTIONS ° °Always review your discharge instruction sheet given to you by the facility where your surgery was performed. ° °IF YOU HAVE DISABILITY OR FAMILY LEAVE FORMS, YOU MUST BRING THEM TO THE OFFICE FOR PROCESSING.  DO NOT GIVE THEM TO YOUR DOCTOR. ° °1. A prescription for pain medication may be given to you upon discharge.  Take your  pain medication as prescribed, if needed.  If narcotic pain medicine is not needed, then you may take acetaminophen (Tylenol) or ibuprofen (Advil) as needed. °2. Take your usually prescribed medications unless otherwise directed °3. If you need a refill on your pain medication, please contact your pharmacy.  They will contact our office to request authorization.  Prescriptions will not be filled after 5pm or on week-ends. °4. You should eat very light the first 24 hours after surgery, such as soup, crackers, pudding, etc.  Resume your normal diet the day after surgery. °5. Most patients will experience some swelling and bruising in the breast.  Ice packs and a good support bra will help.  Swelling and bruising can take several days to resolve.  °6. It is common to experience some constipation if taking pain medication after surgery.  Increasing fluid intake and taking a stool softener will usually help or prevent this problem from occurring.  A mild laxative (Milk of Magnesia or Miralax) should be taken according to package directions if there are no bowel movements after 48 hours. °7. Unless discharge instructions indicate otherwise, you may remove your bandages 24-48 hours after surgery, and you may shower at that time.  You may have steri-strips (small skin tapes) in place directly over the incision.  These strips should be left on the skin for 7-10 days.  If your surgeon used skin glue on the incision, you may shower in 24 hours.  The glue will flake off over the next 2-3 weeks.  Any sutures or staples will be removed at the office during your follow-up visit. °  8. ACTIVITIES:  You may resume regular daily activities (gradually increasing) beginning the next day.  Wearing a good support bra or sports bra minimizes pain and swelling.  You may have sexual intercourse when it is comfortable. °a. You may drive when you no longer are taking prescription pain medication, you can comfortably wear a seatbelt, and you can  safely maneuver your car and apply brakes. °b. RETURN TO WORK:  ______________________________________________________________________________________ °9. You should see your doctor in the office for a follow-up appointment approximately two weeks after your surgery.  Your doctor’s nurse will typically make your follow-up appointment when she calls you with your pathology report.  Expect your pathology report 2-3 business days after your surgery.  You may call to check if you do not hear from us after three days. °10. OTHER INSTRUCTIONS: _______________________________________________________________________________________________ _____________________________________________________________________________________________________________________________________ °_____________________________________________________________________________________________________________________________________ °_____________________________________________________________________________________________________________________________________ ° °WHEN TO CALL YOUR DOCTOR: °1. Fever over 101.0 °2. Nausea and/or vomiting. °3. Extreme swelling or bruising. °4. Continued bleeding from incision. °5. Increased pain, redness, or drainage from the incision. ° °The clinic staff is available to answer your questions during regular business hours.  Please don’t hesitate to call and ask to speak to one of the nurses for clinical concerns.  If you have a medical emergency, go to the nearest emergency room or call 911.  A surgeon from [facility] is always on call at the hospital. ° °For further questions, please visit [email or web address]  °

## 2017-07-24 NOTE — Anesthesia Procedure Notes (Signed)
Procedure Name: LMA Insertion Date/Time: 07/24/2017 12:57 PM Performed by: Abiel Antrim D Pre-anesthesia Checklist: Patient identified, Emergency Drugs available, Suction available and Patient being monitored Patient Re-evaluated:Patient Re-evaluated prior to induction Oxygen Delivery Method: Circle system utilized Preoxygenation: Pre-oxygenation with 100% oxygen Induction Type: IV induction Ventilation: Mask ventilation without difficulty LMA: LMA inserted LMA Size: 3.0 Number of attempts: 1 Airway Equipment and Method: Bite block Placement Confirmation: positive ETCO2 Tube secured with: Tape Dental Injury: Teeth and Oropharynx as per pre-operative assessment

## 2017-07-24 NOTE — Anesthesia Preprocedure Evaluation (Addendum)
Anesthesia Evaluation  Patient identified by MRN, date of birth, ID band Patient awake    Reviewed: Allergy & Precautions, NPO status , Patient's Chart, lab work & pertinent test results  Airway Mallampati: III  TM Distance: >3 FB Neck ROM: Full    Dental  (+) Chipped,    Pulmonary COPD,  COPD inhaler, former smoker,    Pulmonary exam normal breath sounds clear to auscultation       Cardiovascular negative cardio ROS Normal cardiovascular exam Rhythm:Regular Rate:Normal     Neuro/Psych PSYCHIATRIC DISORDERS Anxiety Depression TIACVA, No Residual Symptoms    GI/Hepatic negative GI ROS, Neg liver ROS,   Endo/Other  Morbid obesity  Renal/GU negative Renal ROS     Musculoskeletal negative musculoskeletal ROS (+)   Abdominal (+) + obese,   Peds  Hematology negative hematology ROS (+)   Anesthesia Other Findings LEFT BREAST CANCER, RIGHT BREAST DCIS HLD Ambulates with cane  Reproductive/Obstetrics                            Anesthesia Physical Anesthesia Plan  ASA: III  Anesthesia Plan: General and Regional   Post-op Pain Management: GA combined w/ Regional for post-op pain   Induction: Intravenous  PONV Risk Score and Plan: 3 and Ondansetron, Dexamethasone and Midazolam  Airway Management Planned: LMA  Additional Equipment:   Intra-op Plan:   Post-operative Plan: Extubation in OR  Informed Consent: I have reviewed the patients History and Physical, chart, labs and discussed the procedure including the risks, benefits and alternatives for the proposed anesthesia with the patient or authorized representative who has indicated his/her understanding and acceptance.   Dental advisory given  Plan Discussed with: CRNA  Anesthesia Plan Comments:        Anesthesia Quick Evaluation

## 2017-07-24 NOTE — Progress Notes (Signed)
Assisted Dr. Ellender with left, ultrasound guided, pectoralis block. Side rails up, monitors on throughout procedure. See vital signs in flow sheet. Tolerated Procedure well. 

## 2017-07-24 NOTE — Anesthesia Postprocedure Evaluation (Signed)
Anesthesia Post Note  Patient: Melanie Schneider  Procedure(s) Performed: BILATERAL BREAST LUMPECTOMY WITH BILATERAL NEEDLE LOCALIZATION AND LEFT AXILLARY SENTINEL LYMPH NODE BX (Bilateral Breast)     Patient location during evaluation: PACU Anesthesia Type: Regional and General Level of consciousness: awake and alert Pain management: pain level controlled Vital Signs Assessment: post-procedure vital signs reviewed and stable Respiratory status: spontaneous breathing, nonlabored ventilation, respiratory function stable and patient connected to nasal cannula oxygen Cardiovascular status: blood pressure returned to baseline and stable Postop Assessment: no apparent nausea or vomiting Anesthetic complications: no    Last Vitals:  Vitals:   07/24/17 1500 07/24/17 1515  BP: 134/66 105/83  Pulse: 62 67  Resp: 14 (!) 21  Temp:    SpO2: 100% 100%    Last Pain:  Vitals:   07/24/17 1500  PainSc: 5                  Ryan P Ellender

## 2017-07-25 ENCOUNTER — Encounter (HOSPITAL_BASED_OUTPATIENT_CLINIC_OR_DEPARTMENT_OTHER): Payer: Self-pay | Admitting: Surgery

## 2017-07-25 DIAGNOSIS — G8918 Other acute postprocedural pain: Secondary | ICD-10-CM | POA: Diagnosis not present

## 2017-07-31 ENCOUNTER — Ambulatory Visit (HOSPITAL_BASED_OUTPATIENT_CLINIC_OR_DEPARTMENT_OTHER): Payer: PPO | Admitting: Hematology and Oncology

## 2017-07-31 DIAGNOSIS — D0512 Intraductal carcinoma in situ of left breast: Secondary | ICD-10-CM

## 2017-07-31 DIAGNOSIS — Z170 Estrogen receptor positive status [ER+]: Secondary | ICD-10-CM

## 2017-07-31 DIAGNOSIS — C50212 Malignant neoplasm of upper-inner quadrant of left female breast: Secondary | ICD-10-CM

## 2017-07-31 NOTE — Progress Notes (Signed)
Patient Care Team: Jinny Sanders, MD as PCP - General Erroll Luna, MD as Consulting Physician (General Surgery) Nicholas Lose, MD as Consulting Physician (Hematology and Oncology) Kyung Rudd, MD as Consulting Physician (Radiation Oncology)  DIAGNOSIS:  Encounter Diagnosis  Name Primary?  . Malignant neoplasm of upper-inner quadrant of left breast in female, estrogen receptor positive ([place])     SUMMARY OF ONCOLOGIC HISTORY:   Ductal carcinoma in situ (DCIS) of right breast   06/28/2017 Initial Diagnosis    Ductal carcinoma in situ (DCIS) of right breast      07/24/2017 Surgery    Right lumpectomy: DCIS intermediate grade 0.6 cm, Tis NX stage 0       Malignant neoplasm of upper-inner quadrant of left breast in female, estrogen receptor positive ([place])   06/26/2017 Initial Diagnosis    Screening detected right breast calcifications and architectural distortion on the left breast; right breast calcs 1.1 cm normal biopsy DCIS ER 90%, PR 95%, grade 1-2; Tis Nx stage 0 left breast distortion 1.8 cm mass by ultrasound biopsy: IDC with DCIS ER 95%, PR 5%, Ki-67 15%, HER-2 negative ratio 1.39, grade 1-2, T1c N0 stage IA clinical stage      07/24/2017 Surgery    Left lumpectomy: IDC grade 2, 1.5 cm, intermediate grade DCIS, anterior margin broadly positive, posterior margin focally positive, lymphovascular invasion present, 0/2 lymph nodes negative T1CN0 stage I a       CHIEF COMPLIANT: Follow-up after recent bilateral lumpectomies  INTERVAL HISTORY: Melanie Schneider is a 71 year old with above-mentioned history of left breast cancer treated with lumpectomy and is here today to discuss the results.  She had bilateral lumpectomies.  On the left side she had stage Ia breast cancer on the right side she had stage 0 DCIS.  Both tumors were ER PR positive HER-2 negative.  She is here today accompanied by her husband to discuss adjuvant treatment plan.  She complains of pain and  discomfort but it is getting better with time.  REVIEW OF SYSTEMS:   Constitutional: Denies fevers, chills or abnormal weight loss Eyes: Denies blurriness of vision Ears, nose, mouth, throat, and face: Denies mucositis or sore throat Respiratory: Denies cough, dyspnea or wheezes Cardiovascular: Denies palpitation, chest discomfort Gastrointestinal:  Denies nausea, heartburn or change in bowel habits Skin: Denies abnormal skin rashes Lymphatics: Denies new lymphadenopathy or easy bruising Neurological:Denies numbness, tingling or new weaknesses Behavioral/Psych: Mood is stable, no new changes  Extremities: No lower extremity edema Breast: Bilateral lumpectomies All other systems were reviewed with the patient and are negative.  I have reviewed the past medical history, past surgical history, social history and family history with the patient and they are unchanged from previous note.  ALLERGIES:  is allergic to iodine; other; povidone-iodine; adhesive [tape]; and latex.  MEDICATIONS:  Current Outpatient Medications  Medication Sig Dispense Refill  . albuterol (PROVENTIL HFA;VENTOLIN HFA) 108 (90 Base) MCG/ACT inhaler Inhale into the lungs every 6 (six) hours as needed for wheezing or shortness of breath.    Marland Kitchen atorvastatin (LIPITOR) 40 MG tablet Take 1 tablet (40 mg total) by mouth daily. 30 tablet 11  . HYDROcodone-acetaminophen (NORCO/VICODIN) 5-325 MG tablet Take 1-2 tablets by mouth every 6 (six) hours as needed for moderate pain. 15 tablet 0  . umeclidinium-vilanterol (ANORO ELLIPTA) 62.5-25 MCG/INH AEPB Inhale 1 puff into the lungs daily. 1 each 11  . Vitamin D, Ergocalciferol, (DRISDOL) 50000 units CAPS capsule Take 50,000 Units by mouth every 7 (seven)  days.     No current facility-administered medications for this visit.     PHYSICAL EXAMINATION: ECOG PERFORMANCE STATUS: 1 - Symptomatic but completely ambulatory  Vitals:   07/31/17 1143  BP: (!) 107/45  Pulse: 62  Resp:  18  Temp: 97.8 F (36.6 C)  SpO2: 97%   Filed Weights   07/31/17 1143  Weight: 226 lb 11.2 oz (102.8 kg)    GENERAL:alert, no distress and comfortable SKIN: skin color, texture, turgor are normal, no rashes or significant lesions EYES: normal, Conjunctiva are pink and non-injected, sclera clear OROPHARYNX:no exudate, no erythema and lips, buccal mucosa, and tongue normal  NECK: supple, thyroid normal size, non-tender, without nodularity LYMPH:  no palpable lymphadenopathy in the cervical, axillary or inguinal LUNGS: clear to auscultation and percussion with normal breathing effort HEART: regular rate & rhythm and no murmurs and no lower extremity edema ABDOMEN:abdomen soft, non-tender and normal bowel sounds MUSCULOSKELETAL:no cyanosis of digits and no clubbing  NEURO: alert & oriented x 3 with fluent speech, no focal motor/sensory deficits EXTREMITIES: No lower extremity edema   LABORATORY DATA:  I have reviewed the data as listed   Chemistry      Component Value Date/Time   NA 142 07/04/2017 1141   K 3.7 07/04/2017 1141   CL 104 01/15/2017 1116   CL 104 06/16/2013 0452   CO2 27 07/04/2017 1141   BUN 12.1 07/04/2017 1141   CREATININE 0.7 07/04/2017 1141      Component Value Date/Time   CALCIUM 8.7 07/04/2017 1141   ALKPHOS 79 07/04/2017 1141   AST 9 07/04/2017 1141   ALT 9 07/04/2017 1141   BILITOT 0.59 07/04/2017 1141       Lab Results  Component Value Date   WBC 7.6 07/04/2017   HGB 12.6 07/04/2017   HCT 38.9 07/04/2017   MCV 85.2 07/04/2017   PLT 267 07/04/2017   NEUTROABS 5.0 07/04/2017    ASSESSMENT & PLAN:  Malignant neoplasm of upper-inner quadrant of left breast in female, estrogen receptor positive ([place]) 07/24/2017 left lumpectomy: IDC grade 2, 1.5 cm, intermediate grade DCIS, anterior margin broadly positive, posterior margin focally positive, lymphovascular invasion present, 0/2 lymph nodes negative T1CN0 stage I a  Right lumpectomy: DCIS grade  2 Tis NX stage 0  Pathology counseling: I discussed the final pathology report of the patient provided  a copy of this report. I discussed the margins as well as lymph node surgeries. We also discussed the final staging along with previously performed ER/PR and HER-2/neu testing.  Recommendations: 1. adjuvant radiation therapy 2. Followed by adjuvant antiestrogen therapy  Patient is not a good candidate for systemic chemotherapy and hence we elected not to perform any molecular testing like Oncotype DX. Return to clinic at the end of radiation to start antiestrogen therapy  I spent 25 minutes talking to the patient of which more than half was spent in counseling and coordination of care.  No orders of the defined types were placed in this encounter.  The patient has a good understanding of the overall plan. she agrees with it. she will call with any problems that may develop before the next visit here.   Rulon Eisenmenger, MD 07/31/17

## 2017-07-31 NOTE — Assessment & Plan Note (Signed)
07/24/2017 left lumpectomy: IDC grade 2, 1.5 cm, intermediate grade DCIS, anterior margin broadly positive, posterior margin focally positive, lymphovascular invasion present, 0/2 lymph nodes negative T1CN0 stage I a  Right lumpectomy: DCIS grade 2 Tis NX stage 0  Pathology counseling: I discussed the final pathology report of the patient provided  a copy of this report. I discussed the margins as well as lymph node surgeries. We also discussed the final staging along with previously performed ER/PR and HER-2/neu testing.  Recommendations: 1. adjuvant radiation therapy 2. Followed by adjuvant antiestrogen therapy  Patient is not a good candidate for systemic chemotherapy and hence we elected not to perform any molecular testing like Oncotype DX.

## 2017-08-03 NOTE — Progress Notes (Signed)
Location of Breast Cancer:Upper-inner quadrant of left breast,Ductal carcinoma in situ (DCIS) of right breast    Histology per Pathology Report:  Diagnosis 07-24-17  1. Breast, lumpectomy, Left - INVASIVE DUCTAL CARCINOMA, GRADE II/III, SPANNING 1.5 CM. - DUCTAL CARCINOMA IN SITU, INTERMEDIATE GRADE. - INVASIVE CARCINOMA IS BROADLY PRESENT AT THE ANTERIOR MARGIN OF SPECIMEN #1, FOCALLY PRESENT AT THE POSTERIOR MARGIN OF SPECIMEN #1, AND FOCALLY . - LYMPHOVASCULAR INVASION IS IDENTIFIED. - SEE ONCOLOGY TABLE BELOW. 2. Breast, excision, Left additional Anterior Margin - BENIGN FIBROADIPOSE TISSUE. - THERE IS NO EVIDENCE OF MALIGNANCY. - SEE COMMENT. 3. Breast, excision, Left additional Inferior Margin - INVASIVE DUCTAL CARCINOMA. - THE NEW SURGICAL RESECTION MARGIN IS NEGATIVE FOR CARCINOMA. 4. Breast, lumpectomy, Right - DUCTAL CARCINOMA IN SITU WITH CALCIFICATIONS, INTERMEDIATE GRADE, SPANNING 0.6 CM. - DUCTAL CARCINOMA IN SITU IS FOCALLY 0.1 CM TO THE ANTERIOR MARGIN OF SPECIMEN #4. - SEE ONCOLOGY TABLE BELOW. 5. Lymph node, sentinel, biopsy, Left - THERE IS NO EVIDENCE OF CARCINOMA IN 1 OF 1 LYMPH NODE (0/1). 6. Lymph node, sentinel, biopsy, Left - THERE IS NO EVIDENCE OF CARCINOMA IN 1 OF 1 LYMPH NODE (0/1).  Receptor Status: ER(95 % +), PR (5% +) , Her2-neu ( - ratio 1.39), Ki-(15 %) Left breast Receptor Status: ER(90 % +), PR (95% +), Her2-neu (), Ki-()                           Right breast  Diagnosis 06-26-17 Breast, left, needle core biopsy, mass - INVASIVE DUCTAL CARCINOMA. - DUCTAL CARCINOMA IN SITU. - SEE COMMENT. Microscopic Comment The carcinoma appears grade I-II. A breast prognostic profile will be performed and the results reported separately. The results were called to [facility] on 06/27/2017. (JBK:ah 06/27/17)  Receptor Status: ER(95 % +), PR (5% +) , Her2-neu ( - ratio 1.39), Ki-(15 %) Left breast    Right breast Her-2 neg Did patient present  with symptoms (if so, please note symptoms) or was this found on screening mammography?: Routine screening mammogram detected right breast calcifications and architectural distortion on the left breast.  Past/Anticipated interventions by surgeon, if any:Dr. Erroll Luna 07-24-17 Breast, lumpectomy, Left,  Breast, excision, Left additional Anterior Margin,  Breast, excision, Left additional Inferior MarginBreast, lumpectomy, Right, Lymph node, sentinel, biopsy, Left, Lymph node, sentinel, Lymph node, 06-26-17 Breast, left, needle core biopsy, mass   Past/Anticipated interventions by medical oncology, if any: Chemotherapy No,  Dr. Lindi Adie 08-10-17 adjuvant radiation therapy,Followed by adjuvant antiestrogen therapy  Lymphedema issues, if any:  No Skin to to left breast with swelling and tender to touch with discoloration and right breast the same. She has good Rom to both arms with moderate discomfort. Pain issues, if any:  3/10 to left breast reports she is not taking pain medication.  SAFETY ISSUES:  Prior radiation? : No  Pacemaker/ICD? : No  Possible current pregnancy? :No  Is the patient on methotrexate? : No  Menarche    G P    BC   Menopause    HRT Current Complaints / other details:  71 y.o. married woman with a family history of breast cancer mother and father had some type of cancer Wt Readings from Last 3 Encounters:  08/08/17 226 lb 3.2 oz (102.6 kg)  07/31/17 226 lb 11.2 oz (102.8 kg)  07/24/17 226 lb 6.4 oz (102.7 kg)  BP (!) 151/95 (BP Location: Right Leg, Patient Position: Sitting, Cuff Size: Normal)  Pulse 60   Temp 98.1 F (36.7 C) (Oral)   Resp 20   Ht _0  (1.6 m)   Wt 226 lb 3.2 oz (102.6 kg)   SpO2 98%   BMI 40.07 kg/m    Melanie Spurling, RN 08/03/2017,4:11 PM

## 2017-08-04 DIAGNOSIS — J441 Chronic obstructive pulmonary disease with (acute) exacerbation: Secondary | ICD-10-CM | POA: Diagnosis not present

## 2017-08-06 ENCOUNTER — Encounter: Payer: Self-pay | Admitting: Radiation Oncology

## 2017-08-08 ENCOUNTER — Ambulatory Visit
Admission: RE | Admit: 2017-08-08 | Discharge: 2017-08-08 | Disposition: A | Discharge status: Home / Self Care | Payer: PPO | Source: Ambulatory Visit | Attending: Radiation Oncology | Admitting: Radiation Oncology

## 2017-08-08 ENCOUNTER — Encounter: Payer: Self-pay | Admitting: Radiation Oncology

## 2017-08-08 ENCOUNTER — Other Ambulatory Visit: Payer: Self-pay

## 2017-08-08 VITALS — BP 151/95 | HR 60 | Temp 98.1°F | Resp 20 | Ht 63.0 in | Wt 226.2 lb

## 2017-08-08 DIAGNOSIS — C50212 Malignant neoplasm of upper-inner quadrant of left female breast: Secondary | ICD-10-CM

## 2017-08-08 DIAGNOSIS — Z170 Estrogen receptor positive status [ER+]: Principal | ICD-10-CM

## 2017-08-08 DIAGNOSIS — Z9889 Other specified postprocedural states: Secondary | ICD-10-CM | POA: Diagnosis not present

## 2017-08-08 DIAGNOSIS — Z803 Family history of malignant neoplasm of breast: Secondary | ICD-10-CM | POA: Diagnosis not present

## 2017-08-08 DIAGNOSIS — D0511 Intraductal carcinoma in situ of right breast: Secondary | ICD-10-CM | POA: Diagnosis not present

## 2017-08-08 DIAGNOSIS — Z510 Encounter for antineoplastic radiation therapy: Secondary | ICD-10-CM | POA: Diagnosis not present

## 2017-08-08 NOTE — Progress Notes (Signed)
Radiation Oncology         [phone] ________________________________  Name: Melanie Schneider        MRN: [id number]  Date of Service: 08/08/2017 DOB: 10-03-1945  FK:[id number], Mervyn Gay, MD  Nicholas Lose, MD     REFERRING PHYSICIAN: Nicholas Lose, MD   DIAGNOSIS: There were no encounter diagnoses.   HISTORY OF PRESENT ILLNESS: Melanie Schneider is a 71 y.o. female seen in the multidisciplinary breast clinic for a new diagnosis of bilateral breast cancer. The patient was noted to have calcifications in the right breast and architectural abnormality in the left breast. She underwent diagnostic imaging which revealed a 1.1 cm span of calcifications in the right breast and her axilla was negative for adenopathy. The left distortion on ultrasound revealed a 1.8 cm lesion at 11:00, and her axilla was negative as well. Biopsy of both breast sites was performed on 06/26/17. The right breast lesion revealed a grade 1-2 ER/PR positive DCIS. The left breast revealed a grade 1-2 invasive ductal carcinoma, ER/PR positive HER2 negative with a Ki 67 of 15%.   She subsequently underwent bilateral lumpectomy with left sentinel node biopsy on 07/24/17. Her right breast revealed an intermediate grade 6 mm DCIS. The left breast revealed a grade 2 1.5 cm invasive ductal carcinoma with intermediate grade DCIS and disease in the anterior and posterior margin. Additional anterior and inferior margins were re-excised and were negative. Of the two nodes sampled, none contained disease. No oncotype was ordered as she was not felt to be a candidate for chemotherapy. She comes today to review options of radiotherapy.    PREVIOUS RADIATION THERAPY: No   PAST MEDICAL HISTORY:  Past Medical History:  Diagnosis Date  . Breast cancer, left ([place]) 06/2017  . COPD (chronic obstructive pulmonary disease) ([facility])    states coughs frequently; occasional SOB with ADLs; no home O2  . Ductal carcinoma in situ (DCIS) of  right breast 06/2017  . Dysthymia   . Family history of adverse reaction to anesthesia    states father had reaction to anesthesia and had to be resuscitated  . History of CVA (cerebrovascular accident) 2014  . Hyperlipidemia   . Peripheral neuropathy    lower legs - walks with a straight cane       PAST SURGICAL HISTORY: Past Surgical History:  Procedure Laterality Date  . CARDIAC CATHETERIZATION  12/26/2001   normal  . ESOPHAGOGASTRODUODENOSCOPY  02/2005  . TONSILLECTOMY     as a child  . VAGINAL HYSTERECTOMY       FAMILY HISTORY:  Family History  Problem Relation Age of Onset  . Cancer Father   . Anesthesia problems Father        had unknown (by pt.) reaction to anesthesia, had to be resuscitated  . Heart disease Mother   . Breast cancer Mother   . Alcohol abuse Unknown   . Breast cancer Unknown        1st degree relative <50: mother and aunt  . Asthma Daughter   . Emphysema Paternal Grandfather      SOCIAL HISTORY:  reports that she quit smoking about 3 years ago. She smoked 0.00 packs per day for 51.00 years. she has never used smokeless tobacco. She reports that she does not drink alcohol or use drugs. The patient is married. She lives in [place].   ALLERGIES: Iodine; Other; Povidone-iodine; Adhesive [tape]; and Latex   MEDICATIONS:  Current Outpatient Medications  Medication Sig Dispense Refill  .  albuterol (PROVENTIL HFA;VENTOLIN HFA) 108 (90 Base) MCG/ACT inhaler Inhale into the lungs every 6 (six) hours as needed for wheezing or shortness of breath.    . atorvastatin (LIPITOR) 40 MG tablet Take 1 tablet (40 mg total) by mouth daily. 30 tablet 11  . umeclidinium-vilanterol (ANORO ELLIPTA) 62.5-25 MCG/INH AEPB Inhale 1 puff into the lungs daily. 1 each 11  . Vitamin D, Ergocalciferol, (DRISDOL) 50000 units CAPS capsule Take 50,000 Units by mouth every 7 (seven) days.    . HYDROcodone-acetaminophen (NORCO/VICODIN) 5-325 MG tablet Take 1-2 tablets by mouth  every 6 (six) hours as needed for moderate pain. (Patient not taking: Reported on 08/08/2017) 15 tablet 0   No current facility-administered medications for this encounter.      REVIEW OF SYSTEMS: On review of systems, the patient reports that she is doing well overall. She denies any chest pain, shortness of breath, cough, fevers, chills, night sweats, unintended weight changes. She denies any bowel or bladder disturbances, and denies abdominal pain, nausea or vomiting. She denies any new musculoskeletal or joint aches or pains. A complete review of systems is obtained and is otherwise negative.     PHYSICAL EXAM:  Wt Readings from Last 3 Encounters:  07/31/17 226 lb 11.2 oz (102.8 kg)  07/24/17 226 lb 6.4 oz (102.7 kg)  07/04/17 227 lb (103 kg)   Temp Readings from Last 3 Encounters:  07/31/17 97.8 F (36.6 C) (Oral)  07/24/17 97.8 F (36.6 C) (Oral)  07/04/17 97.7 F (36.5 C) (Oral)   BP Readings from Last 3 Encounters:  07/31/17 (!) 107/45  07/24/17 (!) 157/74  07/04/17 (!) 104/52   Pulse Readings from Last 3 Encounters:  07/31/17 62  07/24/17 64  07/04/17 63     In general this is a well appearing caucasian female in no acute distress. She is alert and oriented x4 and appropriate throughout the examination. HEENT reveals that the patient is normocephalic, atraumatic. EOMs are intact. PERRLA. Skin is intact without any evidence of gross lesions. Cardiovascular exam reveals a regular rate and rhythm, no clicks rubs or murmurs are auscultated. Chest is clear to auscultation bilaterally. Lymphatic assessment is performed and does not reveal any adenopathy in the cervical, supraclavicular, axillary, or inguinal chains. Bilateral breast exam is performed and reveals fullness in bilateral breasts along the prior biopsy sites. no nipple bleeding or discharge is noted. Abdomen has active bowel sounds in all quadrants and is intact. The abdomen is soft, non tender, non distended. Lower  extremities are negative for pretibial pitting edema, deep calf tenderness, cyanosis or clubbing.   ECOG = 0  0 - Asymptomatic (Fully active, able to carry on all predisease activities without restriction)  1 - Symptomatic but completely ambulatory (Restricted in physically strenuous activity but ambulatory and able to carry out work of a light or sedentary nature. For example, light housework, office work)  2 - Symptomatic, <50% in bed during the day (Ambulatory and capable of all self care but unable to carry out any work activities. Up and about more than 50% of waking hours)  3 - Symptomatic, >50% in bed, but not bedbound (Capable of only limited self-care, confined to bed or chair 50% or more of waking hours)  4 - Bedbound (Completely disabled. Cannot carry on any self-care. Totally confined to bed or chair)  5 - Death   Oken MM, Creech RH, Tormey DC, et al. (1982). "Toxicity and response criteria of the Eastern Cooperative Oncology Group". Am. J. Clin.   Oncol. 5 (6): 649-55    LABORATORY DATA:  Lab Results  Component Value Date   WBC 7.6 07/04/2017   HGB 12.6 07/04/2017   HCT 38.9 07/04/2017   MCV 85.2 07/04/2017   PLT 267 07/04/2017   Lab Results  Component Value Date   NA 142 07/04/2017   K 3.7 07/04/2017   CL 104 01/15/2017   CO2 27 07/04/2017   Lab Results  Component Value Date   ALT 9 07/04/2017   AST 9 07/04/2017   ALKPHOS 79 07/04/2017   BILITOT 0.59 07/04/2017      RADIOGRAPHY: No results found.     IMPRESSION/PLAN: 1. Stage IA, pT1cN0M0, grade 2 ER/PR positive invasive ductal carcinoma of the left breast and intermittent grade ER/PR positive DCIS of the right breast. Dr. Moody reviews the options for adjuvant therapy and recommends a  course of 4 weeks to both breasts. She has concerns for a lymphocele/seroma along the left axilla, and we have encouraged her to follow up with Dr. Cornett to have this evaluated before we simulate.  We discussed the  risks, benefits, short, and long term effects of radiotherapy, and the patient is interested in proceeding. Dr. Moody discusses the delivery and logistics of radiotherapy. She is scheduled tentatively for simulation following an appointment with PT for evaluation of her chest wall and once she's seen Dr. Cornett. Written consent is obtained and placed in the chart, a copy was provided to the patient.   In a visit lasting 25 minutes, greater than 50% of the time was spent face to face discussing her pathology, and coordinating the patient's care.   The above documentation reflects my direct findings during this shared patient visit. Please see the separate note by Dr. Moody on this date for the remainder of the patient's plan of care.     C. , PAC   

## 2017-08-20 ENCOUNTER — Other Ambulatory Visit: Payer: Self-pay | Admitting: Family Medicine

## 2017-08-20 ENCOUNTER — Encounter: Payer: PPO | Admitting: Physical Therapy

## 2017-08-20 NOTE — Telephone Encounter (Signed)
Last office visit 01/05/2017.  Last refilled?  Last Vit D 01/15/2017 low at 10.68 ng/ml.  Refill?

## 2017-08-21 ENCOUNTER — Ambulatory Visit
Admission: RE | Admit: 2017-08-21 | Discharge: 2017-08-21 | Disposition: A | Discharge status: Home / Self Care | Payer: PPO | Source: Ambulatory Visit | Attending: Radiation Oncology | Admitting: Radiation Oncology

## 2017-08-21 DIAGNOSIS — Z170 Estrogen receptor positive status [ER+]: Principal | ICD-10-CM

## 2017-08-21 DIAGNOSIS — Z510 Encounter for antineoplastic radiation therapy: Secondary | ICD-10-CM | POA: Diagnosis not present

## 2017-08-21 DIAGNOSIS — D0511 Intraductal carcinoma in situ of right breast: Secondary | ICD-10-CM

## 2017-08-21 DIAGNOSIS — C50212 Malignant neoplasm of upper-inner quadrant of left female breast: Secondary | ICD-10-CM | POA: Diagnosis not present

## 2017-08-23 ENCOUNTER — Encounter: Payer: Self-pay | Admitting: General Practice

## 2017-08-23 NOTE — Progress Notes (Signed)
[place] Psychosocial Distress Screening Clinical Social Work  Clinical Social Work was referred by distress screening protocol.  The patient scored a 5 on the Psychosocial Distress Thermometer which indicates moderate distress. Clinical Social Worker Edwyna Shell to assess for distress and other psychosocial needs. Patient concern was that insurance company had denied coverage for procedure because it was not preapproved.  "I guess like anybody, I was not ready for the diagnosis but it could have been worse."  Anticipates starting radiation treatments soon, "through the holidays."  Has support from husband who lives in the home and transports, niece lives out of town and has recently learned of new diagnosis.  Wanted information mailed, unsure if she has needs at this time for Support Center services.  Encouraged to call if needs arise.    ONCBCN DISTRESS SCREENING 08/08/2017  Screening Type   Distress experienced in past week (1-10) 5  Practical problem type   Family Problem type   Emotional problem type Nervousness/Anxiety  Spiritual/Religous concerns type Facing my mortality  Information Concerns Type Lack of info about complementary therapy choices  Physical Problem type Pain;Breathing;Mouth sores/swallowing  Physician notified of physical symptoms Yes  Referral to support programs     Clinical Social Worker follow up needed: No.  If yes, follow up plan:  Edwyna Shell, LCSW Clinical Social Worker Phone:  [phone]

## 2017-08-27 ENCOUNTER — Telehealth: Payer: Self-pay | Admitting: Hematology and Oncology

## 2017-08-27 DIAGNOSIS — Z510 Encounter for antineoplastic radiation therapy: Secondary | ICD-10-CM | POA: Diagnosis not present

## 2017-08-27 DIAGNOSIS — C50212 Malignant neoplasm of upper-inner quadrant of left female breast: Secondary | ICD-10-CM | POA: Diagnosis not present

## 2017-08-27 DIAGNOSIS — D0511 Intraductal carcinoma in situ of right breast: Secondary | ICD-10-CM | POA: Diagnosis not present

## 2017-08-27 NOTE — Telephone Encounter (Signed)
Spoke to patient regarding upcoming January appointments.  °

## 2017-08-28 ENCOUNTER — Ambulatory Visit
Admission: RE | Admit: 2017-08-28 | Discharge: 2017-08-28 | Disposition: A | Discharge status: Home / Self Care | Payer: PPO | Source: Ambulatory Visit | Attending: Radiation Oncology | Admitting: Radiation Oncology

## 2017-08-28 DIAGNOSIS — C50212 Malignant neoplasm of upper-inner quadrant of left female breast: Secondary | ICD-10-CM | POA: Diagnosis not present

## 2017-08-28 DIAGNOSIS — D0511 Intraductal carcinoma in situ of right breast: Secondary | ICD-10-CM | POA: Diagnosis not present

## 2017-08-28 DIAGNOSIS — Z510 Encounter for antineoplastic radiation therapy: Secondary | ICD-10-CM | POA: Diagnosis not present

## 2017-08-29 ENCOUNTER — Ambulatory Visit
Admission: RE | Admit: 2017-08-29 | Discharge: 2017-08-29 | Disposition: A | Discharge status: Home / Self Care | Payer: PPO | Source: Ambulatory Visit | Attending: Radiation Oncology | Admitting: Radiation Oncology

## 2017-08-29 DIAGNOSIS — C50212 Malignant neoplasm of upper-inner quadrant of left female breast: Secondary | ICD-10-CM

## 2017-08-29 DIAGNOSIS — Z510 Encounter for antineoplastic radiation therapy: Secondary | ICD-10-CM | POA: Diagnosis not present

## 2017-08-29 DIAGNOSIS — D0511 Intraductal carcinoma in situ of right breast: Secondary | ICD-10-CM | POA: Diagnosis not present

## 2017-08-29 DIAGNOSIS — Z170 Estrogen receptor positive status [ER+]: Principal | ICD-10-CM

## 2017-08-29 MED ORDER — RADIAPLEXRX EX GEL
Freq: Once | CUTANEOUS | Status: AC
  Administered 2017-08-29: 18:00:00 via TOPICAL

## 2017-08-29 MED ORDER — ALRA NON-METALLIC DEODORANT (RAD-ONC)
1.0000 "application " | Freq: Once | TOPICAL | Status: AC
  Administered 2017-08-29: 1 via TOPICAL

## 2017-08-29 NOTE — Progress Notes (Signed)
Patient education done, my business card, Alra deodorant  and radiaplex given, discussed side effects, skin irritation,pain, swelling, of breast, fatigue, apply radiaplex after rad tx and bedtime daily for moisturizing and soothing skin, alra  after rad tx and prn, luke warm shower/bath,no rubbing,scrubbing, or scratching treated area, unscented soap such as dove,mild, pat dry, no under wire bra if possible, electric shaver only for axilla increase protein in diet, stay hydrated,drink plenty water, sees MD weekly and prn, teach back given

## 2017-08-30 ENCOUNTER — Ambulatory Visit
Admission: RE | Admit: 2017-08-30 | Discharge: 2017-08-30 | Disposition: A | Discharge status: Home / Self Care | Payer: PPO | Source: Ambulatory Visit | Attending: Radiation Oncology | Admitting: Radiation Oncology

## 2017-08-30 DIAGNOSIS — C50212 Malignant neoplasm of upper-inner quadrant of left female breast: Secondary | ICD-10-CM

## 2017-08-30 DIAGNOSIS — Z510 Encounter for antineoplastic radiation therapy: Secondary | ICD-10-CM | POA: Diagnosis not present

## 2017-08-30 DIAGNOSIS — D0511 Intraductal carcinoma in situ of right breast: Secondary | ICD-10-CM | POA: Diagnosis not present

## 2017-08-30 DIAGNOSIS — Z170 Estrogen receptor positive status [ER+]: Principal | ICD-10-CM

## 2017-08-31 ENCOUNTER — Ambulatory Visit
Admission: RE | Admit: 2017-08-31 | Discharge: 2017-08-31 | Disposition: A | Discharge status: Home / Self Care | Payer: PPO | Source: Ambulatory Visit | Attending: Radiation Oncology | Admitting: Radiation Oncology

## 2017-08-31 DIAGNOSIS — D0511 Intraductal carcinoma in situ of right breast: Secondary | ICD-10-CM | POA: Diagnosis not present

## 2017-08-31 DIAGNOSIS — C50212 Malignant neoplasm of upper-inner quadrant of left female breast: Secondary | ICD-10-CM | POA: Diagnosis not present

## 2017-08-31 DIAGNOSIS — Z510 Encounter for antineoplastic radiation therapy: Secondary | ICD-10-CM | POA: Diagnosis not present

## 2017-09-03 ENCOUNTER — Ambulatory Visit: Payer: PPO

## 2017-09-04 ENCOUNTER — Ambulatory Visit
Admission: RE | Admit: 2017-09-04 | Discharge: 2017-09-04 | Disposition: A | Discharge status: Home / Self Care | Payer: PPO | Source: Ambulatory Visit | Attending: Radiation Oncology | Admitting: Radiation Oncology

## 2017-09-04 DIAGNOSIS — D0511 Intraductal carcinoma in situ of right breast: Secondary | ICD-10-CM | POA: Diagnosis not present

## 2017-09-04 DIAGNOSIS — C50212 Malignant neoplasm of upper-inner quadrant of left female breast: Secondary | ICD-10-CM | POA: Diagnosis not present

## 2017-09-04 DIAGNOSIS — Z510 Encounter for antineoplastic radiation therapy: Secondary | ICD-10-CM | POA: Diagnosis not present

## 2017-09-04 NOTE — Addendum Note (Signed)
Encounter addended by: Kyung Rudd, MD on: 09/04/2017 8:59 AM  Actions taken: Sign clinical note

## 2017-09-04 NOTE — Progress Notes (Signed)
  Radiation Oncology         [phone] ________________________________  Name: Melanie Schneider MRN: [id number]  Date: 08/21/2017  DOB: [birth year]  Optical Surface Tracking Plan:  Since intensity modulated radiotherapy (IMRT) and 3D conformal radiation treatment methods are predicated on accurate and precise positioning for treatment, intrafraction motion monitoring is medically necessary to ensure accurate and safe treatment delivery.  The ability to quantify intrafraction motion without excessive ionizing radiation dose can only be performed with optical surface tracking. Accordingly, surface imaging offers the opportunity to obtain 3D measurements of patient position throughout IMRT and 3D treatments without excessive radiation exposure.  I am ordering optical surface tracking for this patient's upcoming course of radiotherapy. ________________________________  Kyung Rudd, MD 09/04/2017 8:59 AM    Reference:   Ursula Alert, J, et al. Surface imaging-based analysis of intrafraction motion for breast radiotherapy patients.Journal of [place], n. 6, nov. 2014. ISSN [id number].   Available at: <http://www.jacmp.org/index.php/jacmp/article/view/4957>.

## 2017-09-04 NOTE — Progress Notes (Signed)
  Radiation Oncology         [phone] ________________________________  Name: Melanie Schneider MRN: [id number]  Date: 08/21/2017  DOB: [birth year]  SIMULATION AND TREATMENT PLANNING NOTE  DIAGNOSIS:     ICD-10-CM   1. Malignant neoplasm of upper-inner quadrant of left breast in female, estrogen receptor positive ([place]) C50.212    Z17.0   2. Ductal carcinoma in situ (DCIS) of right breast D05.11      Site:  Bilateral breasts  NARRATIVE:  The patient was brought to the [facility].  Identity was confirmed.  All relevant records and images related to the planned course of therapy were reviewed.   Written consent to proceed with treatment was confirmed which was freely given after reviewing the details related to the planned course of therapy had been reviewed with the patient.  Then, the patient was set-up in a stable reproducible  supine position for radiation therapy.  CT images were obtained.  Surface markings were placed.    Medically necessary complex treatment device(s) for immobilization:  Vac-lock.   The CT images were loaded into the planning software.  Then the target and avoidance structures were contoured.  Treatment planning then occurred.  The radiation prescription was entered and confirmed.  A total of 4 complex treatment devices were fabricated which relate to the designed radiation treatment fields.  A reduced field technique will also be utilized as necessary to improve the dose homogeneity of the plan . Each of these customized fields/ complex treatment devices will be used on a daily basis during the radiation course. I have requested : 3D Simulation  I have requested a DVH of the following structures: Target volume, heart, lungs.   PLAN:  The patient will receive 42.56 Gy in 16 fractions initially, bilaterally.  The patient will then receive a 10 Gy boost to each target site..  ________________________________   Jodelle Gross, MD, PhD

## 2017-09-05 ENCOUNTER — Ambulatory Visit
Admission: RE | Admit: 2017-09-05 | Discharge: 2017-09-05 | Disposition: A | Discharge status: Home / Self Care | Payer: PPO | Source: Ambulatory Visit | Attending: Radiation Oncology | Admitting: Radiation Oncology

## 2017-09-05 DIAGNOSIS — C50212 Malignant neoplasm of upper-inner quadrant of left female breast: Secondary | ICD-10-CM | POA: Diagnosis not present

## 2017-09-05 DIAGNOSIS — Z510 Encounter for antineoplastic radiation therapy: Secondary | ICD-10-CM | POA: Diagnosis not present

## 2017-09-05 DIAGNOSIS — D0511 Intraductal carcinoma in situ of right breast: Secondary | ICD-10-CM | POA: Diagnosis not present

## 2017-09-06 ENCOUNTER — Ambulatory Visit
Admission: RE | Admit: 2017-09-06 | Discharge: 2017-09-06 | Disposition: A | Discharge status: Home / Self Care | Payer: PPO | Source: Ambulatory Visit | Attending: Radiation Oncology | Admitting: Radiation Oncology

## 2017-09-06 DIAGNOSIS — Z510 Encounter for antineoplastic radiation therapy: Secondary | ICD-10-CM | POA: Diagnosis not present

## 2017-09-06 DIAGNOSIS — D0511 Intraductal carcinoma in situ of right breast: Secondary | ICD-10-CM | POA: Diagnosis not present

## 2017-09-06 DIAGNOSIS — C50212 Malignant neoplasm of upper-inner quadrant of left female breast: Secondary | ICD-10-CM | POA: Diagnosis not present

## 2017-09-07 ENCOUNTER — Ambulatory Visit
Admission: RE | Admit: 2017-09-07 | Discharge: 2017-09-07 | Disposition: A | Discharge status: Home / Self Care | Payer: PPO | Source: Ambulatory Visit | Attending: Radiation Oncology | Admitting: Radiation Oncology

## 2017-09-07 DIAGNOSIS — D0511 Intraductal carcinoma in situ of right breast: Secondary | ICD-10-CM | POA: Diagnosis not present

## 2017-09-07 DIAGNOSIS — C50212 Malignant neoplasm of upper-inner quadrant of left female breast: Secondary | ICD-10-CM | POA: Diagnosis not present

## 2017-09-07 DIAGNOSIS — Z510 Encounter for antineoplastic radiation therapy: Secondary | ICD-10-CM | POA: Diagnosis not present

## 2017-09-07 DIAGNOSIS — Z170 Estrogen receptor positive status [ER+]: Principal | ICD-10-CM

## 2017-09-07 MED ORDER — RADIAPLEXRX EX GEL
Freq: Two times a day (BID) | CUTANEOUS | Status: DC
  Administered 2017-09-07: 16:00:00 via TOPICAL

## 2017-09-08 ENCOUNTER — Ambulatory Visit
Admission: RE | Admit: 2017-09-08 | Discharge: 2017-09-08 | Disposition: A | Discharge status: Home / Self Care | Payer: PPO | Source: Ambulatory Visit | Attending: Radiation Oncology | Admitting: Radiation Oncology

## 2017-09-08 DIAGNOSIS — C50212 Malignant neoplasm of upper-inner quadrant of left female breast: Secondary | ICD-10-CM | POA: Diagnosis not present

## 2017-09-08 DIAGNOSIS — D0511 Intraductal carcinoma in situ of right breast: Secondary | ICD-10-CM | POA: Diagnosis not present

## 2017-09-08 DIAGNOSIS — Z510 Encounter for antineoplastic radiation therapy: Secondary | ICD-10-CM | POA: Diagnosis not present

## 2017-09-10 ENCOUNTER — Ambulatory Visit
Admission: RE | Admit: 2017-09-10 | Discharge: 2017-09-10 | Disposition: A | Discharge status: Home / Self Care | Payer: PPO | Source: Ambulatory Visit | Attending: Radiation Oncology | Admitting: Radiation Oncology

## 2017-09-10 DIAGNOSIS — Z510 Encounter for antineoplastic radiation therapy: Secondary | ICD-10-CM | POA: Diagnosis not present

## 2017-09-10 DIAGNOSIS — C50212 Malignant neoplasm of upper-inner quadrant of left female breast: Secondary | ICD-10-CM | POA: Diagnosis not present

## 2017-09-10 DIAGNOSIS — D0511 Intraductal carcinoma in situ of right breast: Secondary | ICD-10-CM | POA: Diagnosis not present

## 2017-09-11 ENCOUNTER — Ambulatory Visit
Admission: RE | Admit: 2017-09-11 | Discharge: 2017-09-11 | Disposition: A | Discharge status: Home / Self Care | Payer: PPO | Source: Ambulatory Visit | Attending: Radiation Oncology | Admitting: Radiation Oncology

## 2017-09-11 DIAGNOSIS — Z510 Encounter for antineoplastic radiation therapy: Secondary | ICD-10-CM | POA: Diagnosis not present

## 2017-09-11 DIAGNOSIS — C50212 Malignant neoplasm of upper-inner quadrant of left female breast: Secondary | ICD-10-CM | POA: Diagnosis not present

## 2017-09-11 DIAGNOSIS — D0511 Intraductal carcinoma in situ of right breast: Secondary | ICD-10-CM | POA: Diagnosis not present

## 2017-09-12 ENCOUNTER — Ambulatory Visit
Admission: RE | Admit: 2017-09-12 | Discharge: 2017-09-12 | Disposition: A | Discharge status: Home / Self Care | Payer: PPO | Source: Ambulatory Visit | Attending: Radiation Oncology | Admitting: Radiation Oncology

## 2017-09-12 DIAGNOSIS — Z510 Encounter for antineoplastic radiation therapy: Secondary | ICD-10-CM | POA: Diagnosis not present

## 2017-09-12 DIAGNOSIS — C50212 Malignant neoplasm of upper-inner quadrant of left female breast: Secondary | ICD-10-CM | POA: Diagnosis not present

## 2017-09-12 DIAGNOSIS — D0511 Intraductal carcinoma in situ of right breast: Secondary | ICD-10-CM | POA: Diagnosis not present

## 2017-09-13 ENCOUNTER — Ambulatory Visit
Admission: RE | Admit: 2017-09-13 | Discharge: 2017-09-13 | Disposition: A | Discharge status: Home / Self Care | Payer: PPO | Source: Ambulatory Visit | Attending: Radiation Oncology | Admitting: Radiation Oncology

## 2017-09-13 DIAGNOSIS — Z510 Encounter for antineoplastic radiation therapy: Secondary | ICD-10-CM | POA: Diagnosis not present

## 2017-09-13 DIAGNOSIS — C50212 Malignant neoplasm of upper-inner quadrant of left female breast: Secondary | ICD-10-CM | POA: Diagnosis not present

## 2017-09-13 DIAGNOSIS — D0511 Intraductal carcinoma in situ of right breast: Secondary | ICD-10-CM | POA: Diagnosis not present

## 2017-09-14 ENCOUNTER — Ambulatory Visit: Payer: PPO | Admitting: Radiation Oncology

## 2017-09-14 ENCOUNTER — Ambulatory Visit: Payer: PPO

## 2017-09-17 ENCOUNTER — Ambulatory Visit: Payer: PPO

## 2017-09-19 ENCOUNTER — Ambulatory Visit
Admission: RE | Admit: 2017-09-19 | Discharge: 2017-09-19 | Disposition: A | Discharge status: Home / Self Care | Payer: PPO | Source: Ambulatory Visit | Attending: Radiation Oncology | Admitting: Radiation Oncology

## 2017-09-19 DIAGNOSIS — Z510 Encounter for antineoplastic radiation therapy: Secondary | ICD-10-CM | POA: Diagnosis not present

## 2017-09-19 DIAGNOSIS — D0511 Intraductal carcinoma in situ of right breast: Secondary | ICD-10-CM | POA: Diagnosis not present

## 2017-09-19 DIAGNOSIS — C50212 Malignant neoplasm of upper-inner quadrant of left female breast: Secondary | ICD-10-CM | POA: Diagnosis not present

## 2017-09-19 DIAGNOSIS — Z170 Estrogen receptor positive status [ER+]: Principal | ICD-10-CM

## 2017-09-19 NOTE — Progress Notes (Signed)
Simulation note The patient was brought to the treatment room for simulation for the patient's upcoming electron treatment. The patient was setup in the treatment position and the target region was delineated. The patient will receive treatment to the left breast/ chest wall using an en face electron field. One customized block/complex treatment device has been constructed for this purpose, and this will be used on a daily basis during the patient's treatment. After appropriate set up was confirmed, skin markings were placed to allow accurate targeting of the treatment area during the patient's course of therapy.  ------------------------------------------------  Jodelle Gross, MD, PhD

## 2017-09-20 ENCOUNTER — Ambulatory Visit
Admission: RE | Admit: 2017-09-20 | Discharge: 2017-09-20 | Disposition: A | Discharge status: Home / Self Care | Payer: PPO | Source: Ambulatory Visit | Attending: Radiation Oncology | Admitting: Radiation Oncology

## 2017-09-20 DIAGNOSIS — D0511 Intraductal carcinoma in situ of right breast: Secondary | ICD-10-CM | POA: Diagnosis not present

## 2017-09-20 DIAGNOSIS — Z510 Encounter for antineoplastic radiation therapy: Secondary | ICD-10-CM | POA: Diagnosis not present

## 2017-09-20 DIAGNOSIS — C50212 Malignant neoplasm of upper-inner quadrant of left female breast: Secondary | ICD-10-CM | POA: Diagnosis not present

## 2017-09-21 ENCOUNTER — Ambulatory Visit
Admission: RE | Admit: 2017-09-21 | Discharge: 2017-09-21 | Disposition: A | Discharge status: Home / Self Care | Payer: PPO | Source: Ambulatory Visit | Attending: Radiation Oncology | Admitting: Radiation Oncology

## 2017-09-21 ENCOUNTER — Ambulatory Visit: Payer: PPO

## 2017-09-21 DIAGNOSIS — C50212 Malignant neoplasm of upper-inner quadrant of left female breast: Secondary | ICD-10-CM

## 2017-09-21 DIAGNOSIS — Z170 Estrogen receptor positive status [ER+]: Principal | ICD-10-CM

## 2017-09-21 DIAGNOSIS — D0511 Intraductal carcinoma in situ of right breast: Secondary | ICD-10-CM | POA: Diagnosis not present

## 2017-09-21 DIAGNOSIS — Z510 Encounter for antineoplastic radiation therapy: Secondary | ICD-10-CM | POA: Diagnosis not present

## 2017-09-21 MED ORDER — RADIAPLEXRX EX GEL
Freq: Once | CUTANEOUS | Status: AC
  Administered 2017-09-21: 15:00:00 via TOPICAL

## 2017-09-24 ENCOUNTER — Ambulatory Visit: Payer: PPO

## 2017-09-24 ENCOUNTER — Ambulatory Visit
Admission: RE | Admit: 2017-09-24 | Discharge: 2017-09-24 | Disposition: A | Discharge status: Home / Self Care | Payer: PPO | Source: Ambulatory Visit | Attending: Radiation Oncology | Admitting: Radiation Oncology

## 2017-09-24 DIAGNOSIS — D0511 Intraductal carcinoma in situ of right breast: Secondary | ICD-10-CM | POA: Diagnosis not present

## 2017-09-24 DIAGNOSIS — C50212 Malignant neoplasm of upper-inner quadrant of left female breast: Secondary | ICD-10-CM | POA: Diagnosis not present

## 2017-09-24 DIAGNOSIS — Z510 Encounter for antineoplastic radiation therapy: Secondary | ICD-10-CM | POA: Diagnosis not present

## 2017-09-26 ENCOUNTER — Ambulatory Visit: Payer: PPO

## 2017-09-26 ENCOUNTER — Ambulatory Visit
Admission: RE | Admit: 2017-09-26 | Discharge: 2017-09-26 | Disposition: A | Discharge status: Home / Self Care | Payer: PPO | Source: Ambulatory Visit | Attending: Radiation Oncology | Admitting: Radiation Oncology

## 2017-09-26 DIAGNOSIS — Z510 Encounter for antineoplastic radiation therapy: Secondary | ICD-10-CM | POA: Diagnosis not present

## 2017-09-26 DIAGNOSIS — D0511 Intraductal carcinoma in situ of right breast: Secondary | ICD-10-CM | POA: Diagnosis not present

## 2017-09-26 DIAGNOSIS — C50212 Malignant neoplasm of upper-inner quadrant of left female breast: Secondary | ICD-10-CM | POA: Diagnosis not present

## 2017-09-27 ENCOUNTER — Ambulatory Visit: Payer: PPO

## 2017-09-27 ENCOUNTER — Ambulatory Visit: Payer: PPO | Admitting: Hematology and Oncology

## 2017-09-27 ENCOUNTER — Ambulatory Visit
Admission: RE | Admit: 2017-09-27 | Discharge: 2017-09-27 | Disposition: A | Discharge status: Home / Self Care | Payer: PPO | Source: Ambulatory Visit | Attending: Radiation Oncology | Admitting: Radiation Oncology

## 2017-09-27 DIAGNOSIS — C50212 Malignant neoplasm of upper-inner quadrant of left female breast: Secondary | ICD-10-CM | POA: Diagnosis not present

## 2017-09-27 DIAGNOSIS — Z510 Encounter for antineoplastic radiation therapy: Secondary | ICD-10-CM | POA: Diagnosis not present

## 2017-09-27 DIAGNOSIS — D0511 Intraductal carcinoma in situ of right breast: Secondary | ICD-10-CM | POA: Diagnosis not present

## 2017-09-28 ENCOUNTER — Ambulatory Visit: Payer: PPO

## 2017-09-28 ENCOUNTER — Ambulatory Visit
Admission: RE | Admit: 2017-09-28 | Discharge: 2017-09-28 | Disposition: A | Discharge status: Home / Self Care | Payer: PPO | Source: Ambulatory Visit | Attending: Radiation Oncology | Admitting: Radiation Oncology

## 2017-09-28 DIAGNOSIS — Z510 Encounter for antineoplastic radiation therapy: Secondary | ICD-10-CM | POA: Diagnosis not present

## 2017-09-28 DIAGNOSIS — D0511 Intraductal carcinoma in situ of right breast: Secondary | ICD-10-CM

## 2017-09-28 DIAGNOSIS — Z170 Estrogen receptor positive status [ER+]: Principal | ICD-10-CM

## 2017-09-28 DIAGNOSIS — C50212 Malignant neoplasm of upper-inner quadrant of left female breast: Secondary | ICD-10-CM | POA: Diagnosis not present

## 2017-09-28 MED ORDER — SONAFINE EX EMUL
1.0000 "application " | Freq: Once | CUTANEOUS | Status: AC
  Administered 2017-09-28: 1 via TOPICAL

## 2017-10-01 ENCOUNTER — Ambulatory Visit
Admission: RE | Admit: 2017-10-01 | Discharge: 2017-10-01 | Disposition: A | Discharge status: Home / Self Care | Payer: PPO | Source: Ambulatory Visit | Attending: Radiation Oncology | Admitting: Radiation Oncology

## 2017-10-01 DIAGNOSIS — Z170 Estrogen receptor positive status [ER+]: Principal | ICD-10-CM

## 2017-10-01 DIAGNOSIS — C50212 Malignant neoplasm of upper-inner quadrant of left female breast: Secondary | ICD-10-CM | POA: Diagnosis not present

## 2017-10-01 DIAGNOSIS — Z510 Encounter for antineoplastic radiation therapy: Secondary | ICD-10-CM | POA: Diagnosis not present

## 2017-10-01 DIAGNOSIS — D0511 Intraductal carcinoma in situ of right breast: Secondary | ICD-10-CM | POA: Diagnosis not present

## 2017-10-01 NOTE — Assessment & Plan Note (Signed)
07/24/2017 left lumpectomy: IDC grade 2, 1.5 cm, intermediate grade DCIS, anterior margin broadly positive, posterior margin focally positive, lymphovascular invasion present, 0/2 lymph nodes negative T1CN0 stage I a  Right lumpectomy: DCIS grade 2 Tis NX stage 0 adjuvant radiation therapy 08/29/17- 10/01/17  Recommendations: Adjuvant antiestrogen therapy with Letrozole 2.5 mg daily  Letrozole Counseling: We discussed the risks and benefits of anti-estrogen therapy with aromatase inhibitors. These include but not limited to insomnia, hot flashes, mood changes, vaginal dryness, bone density loss, and weight gain. We strongly believe that the benefits far outweigh the risks. Patient understands these risks and consented to starting treatment. Planned treatment duration is 5-7 years.  RTC in 3 months for SCP visit   Patient is not a good candidate for systemic chemotherapy and hence we elected not to perform any molecular testing like Oncotype DX.

## 2017-10-02 ENCOUNTER — Telehealth: Payer: Self-pay | Admitting: Hematology and Oncology

## 2017-10-02 ENCOUNTER — Inpatient Hospital Stay: Payer: PPO | Attending: Hematology and Oncology | Admitting: Hematology and Oncology

## 2017-10-02 DIAGNOSIS — C50212 Malignant neoplasm of upper-inner quadrant of left female breast: Secondary | ICD-10-CM | POA: Diagnosis not present

## 2017-10-02 DIAGNOSIS — D0511 Intraductal carcinoma in situ of right breast: Secondary | ICD-10-CM

## 2017-10-02 DIAGNOSIS — Z170 Estrogen receptor positive status [ER+]: Secondary | ICD-10-CM

## 2017-10-02 MED ORDER — SONAFINE EX EMUL
1.0000 "application " | Freq: Two times a day (BID) | CUTANEOUS | Status: AC
  Administered 2017-10-02: 1 via TOPICAL

## 2017-10-02 MED ORDER — LETROZOLE 2.5 MG PO TABS: 2.5000 mg | ORAL_TABLET | Freq: Every day | ORAL | 3 refills | Status: DC

## 2017-10-02 NOTE — Telephone Encounter (Signed)
Gave patient AVS and calendar of upcoming May appointments.  °

## 2017-10-02 NOTE — Progress Notes (Signed)
Patient Care Team: Jinny Sanders, MD as PCP - General Erroll Luna, MD as Consulting Physician (General Surgery) Nicholas Lose, MD as Consulting Physician (Hematology and Oncology) Kyung Rudd, MD as Consulting Physician (Radiation Oncology)  DIAGNOSIS:  Encounter Diagnoses  Name Primary?  . Ductal carcinoma in situ (DCIS) of right breast   . Malignant neoplasm of upper-inner quadrant of left breast in female, estrogen receptor positive ([place])     SUMMARY OF ONCOLOGIC HISTORY:   Malignant neoplasm of upper-inner quadrant of left breast in female, estrogen receptor positive ([place])   06/26/2017 Initial Diagnosis    Screening detected right breast calcifications and architectural distortion on the left breast; right breast calcs 1.1 cm normal biopsy DCIS ER 90%, PR 95%, grade 1-2; Tis Nx stage 0 left breast distortion 1.8 cm mass by ultrasound biopsy: IDC with DCIS ER 95%, PR 5%, Ki-67 15%, HER-2 negative ratio 1.39, grade 1-2, T1c N0 stage IA clinical stage      07/24/2017 Surgery    Left lumpectomy: IDC grade 2, 1.5 cm, intermediate grade DCIS, anterior margin broadly positive, posterior margin focally positive, lymphovascular invasion present, 0/2 lymph nodes negative T1CN0 stage I a      08/29/2017 - 10/01/2017 Radiation Therapy    Adjuvant radiation therapy       CHIEF COMPLIANT: Follow-up after adjuvant radiation  INTERVAL HISTORY: Melanie Schneider is a 72 year old with above-mentioned history of right breast cancer who underwent lumpectomy followed by radiation therapy.  She is here to discuss the role of antiestrogen therapy.  Her biggest complaints are related to the itching related to the recently completed radiation.  REVIEW OF SYSTEMS:   Constitutional: Denies fevers, chills or abnormal weight loss Eyes: Denies blurriness of vision Ears, nose, mouth, throat, and face: Denies mucositis or sore throat Respiratory: Denies cough, dyspnea or wheezes Cardiovascular:  Denies palpitation, chest discomfort Gastrointestinal:  Denies nausea, heartburn or change in bowel habits Skin: Denies abnormal skin rashes Lymphatics: Denies new lymphadenopathy or easy bruising Neurological:Denies numbness, tingling or new weaknesses Behavioral/Psych: Mood is stable, no new changes  Extremities: No lower extremity edema Breast: Itching and skin reaction related to recent radiation All other systems were reviewed with the patient and are negative.  I have reviewed the past medical history, past surgical history, social history and family history with the patient and they are unchanged from previous note.  ALLERGIES:  is allergic to iodine; other; povidone-iodine; adhesive [tape]; and latex.  MEDICATIONS:  Current Outpatient Medications  Medication Sig Dispense Refill  . albuterol (PROVENTIL HFA;VENTOLIN HFA) 108 (90 Base) MCG/ACT inhaler Inhale into the lungs every 6 (six) hours as needed for wheezing or shortness of breath.    Marland Kitchen atorvastatin (LIPITOR) 40 MG tablet Take 1 tablet (40 mg total) by mouth daily. 30 tablet 11  . hyaluronate sodium (RADIAPLEXRX) GEL Apply 1 application topically 2 (two) times daily.    Marland Kitchen HYDROcodone-acetaminophen (NORCO/VICODIN) 5-325 MG tablet Take 1-2 tablets by mouth every 6 (six) hours as needed for moderate pain. (Patient not taking: Reported on 08/08/2017) 15 tablet 0  . letrozole (FEMARA) 2.5 MG tablet Take 1 tablet (2.5 mg total) by mouth daily. 90 tablet 3  . non-metallic deodorant (ALRA) MISC Apply 1 application topically.    Marland Kitchen umeclidinium-vilanterol (ANORO ELLIPTA) 62.5-25 MCG/INH AEPB Inhale 1 puff into the lungs daily. 1 each 11  . Vitamin D, Ergocalciferol, (DRISDOL) 50000 units CAPS capsule Take 50,000 Units by mouth every 7 (seven) days.    . Vitamin  D, Ergocalciferol, (DRISDOL) 50000 units CAPS capsule TAKE 1 CAPSULE BY MOUTH ONCE A WEEK 12 capsule 1  . Wound Dressings (SONAFINE EX) Apply topically.     No current  facility-administered medications for this visit.    Facility-Administered Medications Ordered in Other Visits  Medication Dose Route Frequency Provider Last Rate Last Dose  . SONAFINE emulsion 1 application  1 application Topical BID Hayden Pedro, [place]   1 application at 96/22/29 1521    PHYSICAL EXAMINATION: ECOG PERFORMANCE STATUS: 1 - Symptomatic but completely ambulatory  Vitals:   10/02/17 1446  BP: 134/75  Pulse: 68  Resp: 18  Temp: 97.8 F (36.6 C)  SpO2: 95%   Filed Weights   10/02/17 1446  Weight: 223 lb (101.2 kg)    GENERAL:alert, no distress and comfortable SKIN: skin color, texture, turgor are normal, no rashes or significant lesions EYES: normal, Conjunctiva are pink and non-injected, sclera clear OROPHARYNX:no exudate, no erythema and lips, buccal mucosa, and tongue normal  NECK: supple, thyroid normal size, non-tender, without nodularity LYMPH:  no palpable lymphadenopathy in the cervical, axillary or inguinal LUNGS: clear to auscultation and percussion with normal breathing effort HEART: regular rate & rhythm and no murmurs and no lower extremity edema ABDOMEN:abdomen soft, non-tender and normal bowel sounds MUSCULOSKELETAL:no cyanosis of digits and no clubbing  NEURO: alert & oriented x 3 with fluent speech, no focal motor/sensory deficits EXTREMITIES: No lower extremity edema  LABORATORY DATA:  I have reviewed the data as listed CMP Latest Ref Rng & Units 07/04/2017 01/15/2017 10/09/2016  Glucose 70 - 140 mg/dl 109 98 92  BUN 7.0 - 26.0 mg/dL 12.'1 12 14  '$ Creatinine 0.6 - 1.1 mg/dL 0.7 0.62 0.53  Sodium 136 - 145 mEq/L 142 139 141  Potassium 3.5 - 5.1 mEq/L 3.7 4.3 4.0  Chloride 96 - 112 mEq/L - 104 105  CO2 22 - 29 mEq/L '27 29 30  '$ Calcium 8.4 - 10.4 mg/dL 8.7 9.3 9.0  Total Protein 6.4 - 8.3 g/dL 6.7 7.0 7.0  Total Bilirubin 0.20 - 1.20 mg/dL 0.59 0.6 0.5  Alkaline Phos 40 - 150 U/L 79 79 65  AST 5 - 34 U/L '9 11 9  '$ ALT 0 - 55 U/L '9 9 8      '$ Lab Results  Component Value Date   WBC 7.6 07/04/2017   HGB 12.6 07/04/2017   HCT 38.9 07/04/2017   MCV 85.2 07/04/2017   PLT 267 07/04/2017   NEUTROABS 5.0 07/04/2017    ASSESSMENT & PLAN:  Malignant neoplasm of upper-inner quadrant of left breast in female, estrogen receptor positive ([place]) 07/24/2017 left lumpectomy: IDC grade 2, 1.5 cm, intermediate grade DCIS, anterior margin broadly positive, posterior margin focally positive, lymphovascular invasion present, 0/2 lymph nodes negative T1CN0 stage I a  Right lumpectomy: DCIS grade 2 Tis NX stage 0 adjuvant radiation therapy 08/29/17- 10/01/17  Recommendations: Adjuvant antiestrogen therapy with Letrozole 2.5 mg daily.  Patient will start this October 26, 2017  Letrozole Counseling: We discussed the risks and benefits of anti-estrogen therapy with aromatase inhibitors. These include but not limited to insomnia, hot flashes, mood changes, vaginal dryness, bone density loss, and weight gain. We strongly believe that the benefits far outweigh the risks. Patient understands these risks and consented to starting treatment. Planned treatment duration is 5-7 years.  RTC in 3 months for SCP visit in May 2019    I spent 25 minutes talking to the patient of which more than half was spent  in counseling and coordination of care.  No orders of the defined types were placed in this encounter.  The patient has a good understanding of the overall plan. she agrees with it. she will call with any problems that may develop before the next visit here.   Harriette Ohara, MD 10/02/17

## 2017-10-02 NOTE — Assessment & Plan Note (Signed)
07/24/2017 left lumpectomy: IDC grade 2, 1.5 cm, intermediate grade DCIS, anterior margin broadly positive, posterior margin focally positive, lymphovascular invasion present, 0/2 lymph nodes negative T1CN0 stage I a  Right lumpectomy: DCIS grade 2 Tis NX stage 0 adjuvant radiation therapy 08/29/17- 10/01/17  Recommendations: Adjuvant antiestrogen therapy with Letrozole 2.5 mg daily  Letrozole Counseling: We discussed the risks and benefits of anti-estrogen therapy with aromatase inhibitors. These include but not limited to insomnia, hot flashes, mood changes, vaginal dryness, bone density loss, and weight gain. We strongly believe that the benefits far outweigh the risks. Patient understands these risks and consented to starting treatment. Planned treatment duration is 5-7 years.  RTC in 3 months for SCP visit

## 2017-10-09 ENCOUNTER — Telehealth: Payer: Self-pay | Admitting: Family Medicine

## 2017-10-09 DIAGNOSIS — E538 Deficiency of other specified B group vitamins: Secondary | ICD-10-CM

## 2017-10-09 DIAGNOSIS — R7309 Other abnormal glucose: Secondary | ICD-10-CM

## 2017-10-09 DIAGNOSIS — E7800 Pure hypercholesterolemia, unspecified: Secondary | ICD-10-CM

## 2017-10-09 NOTE — Telephone Encounter (Signed)
-----   Message from Eustace Pen, LPN sent at [phone]/54/3606  4:35 PM EST ----- Regarding: Labs 1/17 Lab orders needed. Thank you.  Insurance:  [employer]

## 2017-10-10 ENCOUNTER — Ambulatory Visit: Payer: PPO

## 2017-10-12 ENCOUNTER — Ambulatory Visit (INDEPENDENT_AMBULATORY_CARE_PROVIDER_SITE_OTHER): Payer: PPO

## 2017-10-12 VITALS — BP 134/84 | HR 56 | Temp 97.8°F | Ht 62.5 in | Wt 219.2 lb

## 2017-10-12 DIAGNOSIS — Z0000 Encounter for general adult medical examination without abnormal findings: Secondary | ICD-10-CM

## 2017-10-12 DIAGNOSIS — E2839 Other primary ovarian failure: Secondary | ICD-10-CM

## 2017-10-12 DIAGNOSIS — R7309 Other abnormal glucose: Secondary | ICD-10-CM | POA: Diagnosis not present

## 2017-10-12 DIAGNOSIS — E7800 Pure hypercholesterolemia, unspecified: Secondary | ICD-10-CM

## 2017-10-12 LAB — LIPID PANEL
CHOLESTEROL: 118 mg/dL (ref 0–200)
HDL: 39.5 mg/dL (ref >39.00)
LDL CALC: 67 mg/dL (ref 0–99)
NonHDL: 78.25
TRIGLYCERIDES: 58 mg/dL (ref 0.0–149.0)
Total CHOL/HDL Ratio: 3
VLDL: 11.6 mg/dL (ref 0.0–40.0)

## 2017-10-12 LAB — COMPREHENSIVE METABOLIC PANEL
ALBUMIN: 3.9 g/dL (ref 3.5–5.2)
ALK PHOS: 64 U/L (ref 39–117)
ALT: 12 U/L (ref 0–35)
AST: 15 U/L (ref 0–37)
BUN: 14 mg/dL (ref 6–23)
CALCIUM: 8.8 mg/dL (ref 8.4–10.5)
CO2: 30 mEq/L (ref 19–32)
CREATININE: 0.56 mg/dL (ref 0.40–1.20)
Chloride: 105 mEq/L (ref 96–112)
GFR: 113.29 mL/min (ref >60.00)
Glucose, Bld: 99 mg/dL (ref 70–99)
POTASSIUM: 4.3 meq/L (ref 3.5–5.1)
Sodium: 140 mEq/L (ref 135–145)
TOTAL PROTEIN: 6.5 g/dL (ref 6.0–8.3)
Total Bilirubin: 0.7 mg/dL (ref 0.2–1.2)

## 2017-10-12 LAB — HEMOGLOBIN A1C: Hgb A1c MFr Bld: 6 % (ref 4.6–6.5)
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
## 2017-10-12 NOTE — Progress Notes (Signed)
PCP notes:   Health maintenance:  No gaps identified.  Bone density - pt requested a referral. Referral generated and awaiting PCP signature.   Abnormal screenings:   Hearing - failed  Hearing Screening   125Hz  250Hz  500Hz  1000Hz  2000Hz  3000Hz  4000Hz  6000Hz  8000Hz   Right ear:   40 40 40  0    Left ear:   40 40 40  0      Patient concerns:   Patient is recovering from treatment related to breast cancer.   Nurse concerns:  None  Next PCP appt:   10/16/2017 @ 1400

## 2017-10-12 NOTE — Progress Notes (Signed)
Pre visit review using our clinic review tool, if applicable. No additional management support is needed unless otherwise documented below in the visit note. 

## 2017-10-12 NOTE — Patient Instructions (Addendum)
Melanie Schneider , Thank you for taking time to come for your Medicare Wellness Visit. I appreciate your ongoing commitment to your health goals. Please review the following plan we discussed and let me know if I can assist you in the future.   These are the goals we discussed: Goals    . Follow up with Primary Care Provider     Starting 10/12/2017, I will continue to take medications as prescribed and to keep appointments with PCP as scheduled.        This is a list of the screening recommended for you and due dates:  Health Maintenance  Topic Date Due  . Mammogram  06/21/2018  . Tetanus Vaccine  05/27/2019  . Colon Cancer Screening  09/05/2025  . Flu Shot  Completed  . DEXA scan (bone density measurement)  Completed  .  Hepatitis C: One time screening is recommended by Center for Disease Control  (CDC) for  adults born from [birth year] through 1965.   Completed  . Pneumonia vaccines  Completed   Preventive Care for Adults  A healthy lifestyle and preventive care can promote health and wellness. Preventive health guidelines for adults include the following key practices.  . A routine yearly physical is a good way to check with your health care provider about your health and preventive screening. It is a chance to share any concerns and updates on your health and to receive a thorough exam.  . Visit your dentist for a routine exam and preventive care every 6 months. Brush your teeth twice a day and floss once a day. Good oral hygiene prevents tooth decay and gum disease.  . The frequency of eye exams is based on your age, health, family medical history, use  of contact lenses, and other factors. Follow your health care provider's recommendations for frequency of eye exams.  . Eat a healthy diet. Foods like vegetables, fruits, whole grains, low-fat dairy products, and lean protein foods contain the nutrients you need without too many calories. Decrease your intake of foods high in solid fats,  added sugars, and salt. Eat the right amount of calories for you. Get information about a proper diet from your health care provider, if necessary.  . Regular physical exercise is one of the most important things you can do for your health. Most adults should get at least 150 minutes of moderate-intensity exercise (any activity that increases your heart rate and causes you to sweat) each week. In addition, most adults need muscle-strengthening exercises on 2 or more days a week.  Silver Sneakers may be a benefit available to you. To determine eligibility, you may visit the website: www.silversneakers.com or contact program at 580-787-1274 Mon-Fri between 8AM-8PM.   . Maintain a healthy weight. The body mass index (BMI) is a screening tool to identify possible weight problems. It provides an estimate of body fat based on height and weight. Your health care provider can find your BMI and can help you achieve or maintain a healthy weight.   For adults 20 years and older: ? A BMI below 18.5 is considered underweight. ? A BMI of 18.5 to 24.9 is normal. ? A BMI of 25 to 29.9 is considered overweight. ? A BMI of 30 and above is considered obese.   . Maintain normal blood lipids and cholesterol levels by exercising and minimizing your intake of saturated fat. Eat a balanced diet with plenty of fruit and vegetables. Blood tests for lipids and cholesterol should begin at age  20 and be repeated every 5 years. If your lipid or cholesterol levels are high, you are over 50, or you are at high risk for heart disease, you may need your cholesterol levels checked more frequently. Ongoing high lipid and cholesterol levels should be treated with medicines if diet and exercise are not working.  . If you smoke, find out from your health care provider how to quit. If you do not use tobacco, please do not start.  . If you choose to drink alcohol, please do not consume more than 2 drinks per day. One drink is  considered to be 12 ounces (355 mL) of beer, 5 ounces (148 mL) of wine, or 1.5 ounces (44 mL) of liquor.  . If you are 58-33 years old, ask your health care provider if you should take aspirin to prevent strokes.  . Use sunscreen. Apply sunscreen liberally and repeatedly throughout the day. You should seek shade when your shadow is shorter than you. Protect yourself by wearing long sleeves, pants, a wide-brimmed hat, and sunglasses year round, whenever you are outdoors.  . Once a month, do a whole body skin exam, using a mirror to look at the skin on your back. Tell your health care provider of new moles, moles that have irregular borders, moles that are larger than a pencil eraser, or moles that have changed in shape or color.

## 2017-10-15 ENCOUNTER — Encounter: Payer: Self-pay | Admitting: *Deleted

## 2017-10-15 ENCOUNTER — Other Ambulatory Visit: Payer: Self-pay | Admitting: *Deleted

## 2017-10-16 ENCOUNTER — Ambulatory Visit (INDEPENDENT_AMBULATORY_CARE_PROVIDER_SITE_OTHER): Payer: PPO | Admitting: Family Medicine

## 2017-10-16 ENCOUNTER — Encounter: Payer: Self-pay | Admitting: Radiation Oncology

## 2017-10-16 ENCOUNTER — Encounter: Payer: Self-pay | Admitting: Family Medicine

## 2017-10-16 VITALS — BP 124/62 | HR 81 | Temp 98.0°F | Ht 62.5 in | Wt 217.5 lb

## 2017-10-16 DIAGNOSIS — Z0000 Encounter for general adult medical examination without abnormal findings: Secondary | ICD-10-CM

## 2017-10-16 DIAGNOSIS — I10 Essential (primary) hypertension: Secondary | ICD-10-CM

## 2017-10-16 DIAGNOSIS — R7303 Prediabetes: Secondary | ICD-10-CM

## 2017-10-16 DIAGNOSIS — R7309 Other abnormal glucose: Secondary | ICD-10-CM | POA: Diagnosis not present

## 2017-10-16 DIAGNOSIS — J449 Chronic obstructive pulmonary disease, unspecified: Secondary | ICD-10-CM | POA: Diagnosis not present

## 2017-10-16 DIAGNOSIS — G609 Hereditary and idiopathic neuropathy, unspecified: Secondary | ICD-10-CM | POA: Diagnosis not present

## 2017-10-16 DIAGNOSIS — Z87891 Personal history of nicotine dependence: Secondary | ICD-10-CM | POA: Diagnosis not present

## 2017-10-16 DIAGNOSIS — I5032 Chronic diastolic (congestive) heart failure: Secondary | ICD-10-CM

## 2017-10-16 DIAGNOSIS — E7800 Pure hypercholesterolemia, unspecified: Secondary | ICD-10-CM

## 2017-10-16 DIAGNOSIS — Z170 Estrogen receptor positive status [ER+]: Secondary | ICD-10-CM | POA: Diagnosis not present

## 2017-10-16 DIAGNOSIS — C50212 Malignant neoplasm of upper-inner quadrant of left female breast: Secondary | ICD-10-CM | POA: Diagnosis not present

## 2017-10-16 MED ORDER — ALBUTEROL SULFATE HFA 108 (90 BASE) MCG/ACT IN AERS: 2.0000 | INHALATION_SPRAY | Freq: Four times a day (QID) | RESPIRATORY_TRACT | 3 refills | Status: DC | PRN

## 2017-10-16 NOTE — Assessment & Plan Note (Signed)
Followed by onc.. Starting anti estrogen therapy.

## 2017-10-16 NOTE — Progress Notes (Signed)
I reviewed health advisor's note, was available for consultation, and agree with documentation and plan.  

## 2017-10-16 NOTE — Assessment & Plan Note (Signed)
Euvolemic and HTN controlled.

## 2017-10-16 NOTE — Assessment & Plan Note (Addendum)
Well controlled 

## 2017-10-16 NOTE — Assessment & Plan Note (Signed)
Good control on liptor... ldl  Goal < 70 given history of TIA  Encouraged exercise, weight loss, healthy eating habits.

## 2017-10-16 NOTE — Progress Notes (Signed)
  Radiation Oncology         [phone] ________________________________  Name: Melanie Schneider MRN: [id number]  Date: 10/16/2017  DOB: 1946-09-08  End of Treatment Note  Diagnosis:    1. Malignant neoplasm of upper-inner quadrant of left breast in female, estrogen receptor positive  2. Ductal carcinoma in situ (DCIS) of right breast   Indication for treatment:  Curative      Radiation treatment dates:   08/29/2017 - 01/07/ 2019   Site/dose:     Left breast/2.66Gy x 16 Right breast/2.66 Gy x 16 Boost/2.5 Gy x 4  Beams/energy:  Photon/6X,10X  Narrative: The patient tolerated radiation treatment relatively well.    Plan: The patient has completed radiation treatment. The patient will return to radiation oncology clinic for routine followup in one month. I advised them to call or return sooner if they have any questions or concerns related to their recovery or treatment.  ------------------------------------------------  Jodelle Gross, MD, PhD  This document serves as a record of services personally performed by Kyung Rudd, MD. It was created on his behalf by Valeta Harms, a trained medical scribe. The creation of this record is based on the scribe's personal observations and the provider's statements to them. This document has been checked and approved by the attending provider.

## 2017-10-16 NOTE — Assessment & Plan Note (Signed)
Stable control. 

## 2017-10-16 NOTE — Progress Notes (Signed)
Subjective:    Patient ID: Melanie Schneider, female    DOB: 03/06/1946, 72 y.o.   MRN: [id number]  HPI    The patient presents for complete physical and review of chronic health problems. He/She also has the following acute concerns today:  The patient saw Candis Musa, LPN for medicare wellness. Note reviewed in detail and important notes copied below.  Health maintenance:  No gaps identified.  Bone density - pt requested a referral. Referral generated and awaiting PCP signature.   Abnormal screenings:   Hearing - failed             Hearing Screening   125Hz  250Hz  500Hz  1000Hz  2000Hz  3000Hz  4000Hz  6000Hz  8000Hz   Right ear:   40 40 40  0    Left ear:   40 40 40  0      Patient concerns:   Patient is recovering from treatment related to breast cancer.    10/16/17 Today:  Ductal carcinoma in situ: 07/24/2017 left lumpectomy: IDC grade 2, 1.5 cm, intermediate grade DCIS, anterior margin broadly positive, posterior margin focally positive, lymphovascular invasion present, 0/2 lymph nodes negative T1CN0 stage I a  Right lumpectomy: DCIS grade 2 Tis NX stage 0 adjuvant radiation therapy 08/29/17- 10/01/17   Lumpectomy and radiation, now starting  Letrozole for antiestrogen therapy x 5 years.  Onc Dr. Lindi Adie.  Bilateral peripheral neuropathy: tolerable control.  Elevated Cholesterol:  On Lipitor 40.Marland Kitchen Good control Lab Results  Component Value Date   CHOL 118 10/12/2017   HDL 39.50 10/12/2017   LDLCALC 67 10/12/2017   LDLDIRECT 119.9 10/07/2013   TRIG 58.0 10/12/2017   CHOLHDL 3 10/12/2017  Using medications without problems: none Muscle aches: none Diet compliance: moderate .Marland Kitchen Less bread. Exercise:none Other complaints:  Prediabetes:  Good control on  diet Lab Results  Component Value Date   HGBA1C 6.0 10/12/2017   Hypertension:   Good control on no med.  Using medication without problems or lightheadedness:  none Chest pain with  exertion: none Occ pain in site of breast cancer radiation.. completed 2 weeks ago. Edema: none Short of breath: stable Average home BPs: Other issues:  COPD moderate control on Anoro ellipta.. Using only as needed given cost. Not requiring albuterol rescue.    Social History /Family History/Past Medical History reviewed in detail and updated in EMR if needed. Blood pressure 124/62, pulse 81, temperature 98 F (36.7 C), temperature source Oral, height 5' 2.5" (1.588 m), weight 217 lb 8 oz (98.7 kg).  Review of Systems  Constitutional: Negative for fatigue and fever.  HENT: Negative for congestion.   Eyes: Negative for pain.  Respiratory: Negative for cough and shortness of breath.   Cardiovascular: Negative for chest pain, palpitations and leg swelling.  Gastrointestinal: Negative for abdominal pain.  Genitourinary: Negative for dysuria and vaginal bleeding.  Musculoskeletal: Negative for back pain.  Neurological: Negative for syncope, light-headedness and headaches.  Psychiatric/Behavioral: Negative for dysphoric mood.       Objective:   Physical Exam  Constitutional: Vital signs are normal. She appears well-developed and well-nourished. She is cooperative.  Non-toxic appearance. She does not appear ill. No distress.  Central obesity  walks with cane  HENT:  Head: Normocephalic.  Right Ear: Hearing, tympanic membrane, external ear and ear canal normal.  Left Ear: Hearing, tympanic membrane, external ear and ear canal normal.  Nose: Nose normal.  Eyes: Conjunctivae, EOM and lids are normal. Pupils are equal, round, and reactive to light. Lids are  everted and swept, no foreign bodies found.  Neck: Trachea normal and normal range of motion. Neck supple. Carotid bruit is not present. No thyroid mass and no thyromegaly present.  Cardiovascular: Normal rate, regular rhythm, S1 normal, S2 normal, normal heart sounds and intact distal pulses. Exam reveals no gallop.  No murmur  heard. Pulmonary/Chest: Effort normal and breath sounds normal. No respiratory distress. She has no wheezes. She has no rhonchi. She has no rales.  Abdominal: Soft. Normal appearance and bowel sounds are normal. She exhibits no distension, no fluid wave, no abdominal bruit and no mass. There is no hepatosplenomegaly. There is no tenderness. There is no rebound, no guarding and no CVA tenderness. No hernia.  Lymphadenopathy:    She has no cervical adenopathy.    She has no axillary adenopathy.  Neurological: She is alert. She has normal strength. A sensory deficit is present. No cranial nerve deficit. Coordination and gait abnormal.   Decreased sensation in bilateral legs, chronic venous stasis changes and varicosities   Skin: Skin is warm, dry and intact. No rash noted.  Psychiatric: Her speech is normal and behavior is normal. Judgment normal. Her mood appears not anxious. Cognition and memory are normal. She does not exhibit a depressed mood.          Assessment & Plan:   The patient's preventative maintenance and recommended screening tests for an annual wellness exam were reviewed in full today. Brought up to date unless services declined.  Counselled on the importance of diet, exercise, and its role in overall health and mortality. The patient's FH and SH was reviewed, including their home life, tobacco status, and drug and alcohol status.   Vaccines: uptodate except shingles. Pap/DVE: not indicated, s/p TAH Mammo: personal breast cacer Bone Density: due.. ordered Colon:  08/2015 Smoking Status: former smoker, > 40 years in lung cancer screen protocol. ETOH/ drug use: no ETOH in several year. Hep C:  done   She has lost 10 lbs in last year. Wt Readings from Last 3 Encounters:  10/16/17 217 lb 8 oz (98.7 kg)  10/12/17 219 lb 4 oz (99.5 kg)  10/02/17 223 lb (101.2 kg)

## 2017-10-17 ENCOUNTER — Telehealth: Payer: Self-pay | Admitting: *Deleted

## 2017-10-17 NOTE — Telephone Encounter (Signed)
Received fax from [facility] requesting PA for Ventolin HFA.  PA completed on CoverMyMeds.  Awaiting decision.

## 2017-10-18 NOTE — Telephone Encounter (Signed)
PA approved from 10/17/2017 through 09/24/2018.  [facility] notified of approval via fax.

## 2017-10-25 NOTE — Progress Notes (Signed)
Simulation verification  The patient was brought to the treatment machine and placed in the plan treatment position.  Clinical set up was verified to ensure that the target region is appropriately covered for the patient's upcoming electron boost treatment.  The targeted volume of tissue is appropriately covered by the radiation field.  Based on my personal review, I approve the simulation verification.  The patient's treatment will proceed as planned.  ------------------------------------------------  Jodelle Gross, MD, PhD

## 2017-11-06 ENCOUNTER — Ambulatory Visit
Admission: RE | Admit: 2017-11-06 | Discharge: 2017-11-06 | Disposition: A | Discharge status: Home / Self Care | Payer: PPO | Source: Ambulatory Visit | Attending: Radiation Oncology | Admitting: Radiation Oncology

## 2017-11-06 ENCOUNTER — Encounter: Payer: Self-pay | Admitting: Radiation Oncology

## 2017-11-06 ENCOUNTER — Other Ambulatory Visit: Payer: Self-pay

## 2017-11-06 VITALS — BP 131/64 | HR 66 | Temp 97.5°F | Resp 20 | Ht 62.5 in | Wt 218.4 lb

## 2017-11-06 DIAGNOSIS — Z923 Personal history of irradiation: Secondary | ICD-10-CM | POA: Insufficient documentation

## 2017-11-06 DIAGNOSIS — C50212 Malignant neoplasm of upper-inner quadrant of left female breast: Secondary | ICD-10-CM

## 2017-11-06 DIAGNOSIS — Z79899 Other long term (current) drug therapy: Secondary | ICD-10-CM | POA: Insufficient documentation

## 2017-11-06 DIAGNOSIS — Z170 Estrogen receptor positive status [ER+]: Secondary | ICD-10-CM | POA: Diagnosis not present

## 2017-11-06 DIAGNOSIS — D0511 Intraductal carcinoma in situ of right breast: Secondary | ICD-10-CM | POA: Diagnosis not present

## 2017-11-06 DIAGNOSIS — R200 Anesthesia of skin: Secondary | ICD-10-CM | POA: Insufficient documentation

## 2017-11-06 NOTE — Progress Notes (Signed)
Radiation Oncology         (336) [phone] ________________________________  Name: Melanie Schneider MRN: [id number]  Date of Service: 11/06/2017  DOB: [birth year]  Post Treatment Note  CC: Jinny Sanders, MD  Nicholas Lose, MD  Diagnosis:  Stage IA, pT1cN0M0, grade 2 ER/PR positive invasive ductal carcinoma of the left breast and intermittent grade ER/PR positive DCIS of the right breast   Interval Since Last Radiation:  5 weeks   08/29/2017 - 01/07/ 2019: Left breast/2.66Gy x 16 Right breast/2.66 Gy x 16 Bilateral Boost/2.5 Gy x 4    Narrative:  The patient returns today for routine follow-up. During treatment she did very well with radiotherapy and did not have significant desquamation.                             On review of systems, the patient states she tolerated radiotherapy well.  She states that in the last 2 weeks, she has developed increasing numbness in her left arm extending down into her hand.  She does not noticed this in 1 fingertip or fingers more so than just diffusely.  She states that she notices this mostly when she is seated looking at the computer.  She reports that this typically resolves with positional changes. She denies any changes in speech, or vision when this occurs. No other complaints are noted.   ALLERGIES:  is allergic to iodine; other; povidone-iodine; adhesive [tape]; and latex.  Meds: Current Outpatient Medications  Medication Sig Dispense Refill  . albuterol (PROVENTIL HFA;VENTOLIN HFA) 108 (90 Base) MCG/ACT inhaler Inhale 2 puffs into the lungs every 6 (six) hours as needed for wheezing or shortness of breath (with aerochamber). 1 Inhaler 3  . letrozole (FEMARA) 2.5 MG tablet Take 1 tablet (2.5 mg total) by mouth daily. 90 tablet 3  . Vitamin D, Ergocalciferol, (DRISDOL) 50000 units CAPS capsule TAKE 1 CAPSULE BY MOUTH ONCE A WEEK 12 capsule 1  . atorvastatin (LIPITOR) 40 MG tablet Take 1 tablet (40 mg total) by mouth daily. (Patient not  taking: Reported on 11/06/2017) 30 tablet 11  . umeclidinium-vilanterol (ANORO ELLIPTA) 62.5-25 MCG/INH AEPB Inhale 1 puff into the lungs daily. (Patient not taking: Reported on 11/06/2017) 1 each 11   No current facility-administered medications for this encounter.    Facility-Administered Medications Ordered in Other Encounters  Medication Dose Route Frequency Provider Last Rate Last Dose  . SONAFINE emulsion 1 application  1 application Topical BID Hayden Pedro, [place]   1 application at 22/02/54 1521    Physical Findings:  height is 5' 2.5" (1.588 m) and weight is 218 lb 6.4 oz (99.1 kg). Her oral temperature is 97.5 F (36.4 C) (abnormal). Her blood pressure is 131/64 and her pulse is 66. Her respiration is 20 and oxygen saturation is 98%.  Pain Assessment Pain Score: 6  Pain Loc: Abdomen(Left shoulder, left arm amd left inner arm)/10 In general this is a well appearing caucasian female in no acute distress. She's alert and oriented x4 and appropriate throughout the examination. Cardiopulmonary assessment is negative for acute distress and she exhibits normal effort. The bilateral breasts reveal mild hyperpigmentation without desquamation. The left axilla reveals fullness consistent with seroma. No erythema or punctate lesion is otherwise noted.   Lab Findings: Lab Results  Component Value Date   WBC 7.6 07/04/2017   HGB 12.6 07/04/2017   HCT 38.9 07/04/2017   MCV 85.2 07/04/2017   PLT  267 07/04/2017     Radiographic Findings: No results found.  Impression/Plan: 1. Stage IA, pT1cN0M0, grade 2 ER/PR positive invasive ductal carcinoma of the left breast and intermittent grade ER/PR positive DCIS of the right breast. The patient has been doing well since completion of radiotherapy. We discussed that we would be happy to continue to follow her as needed, but she will also continue to follow up with Dr. Lindi Adie in medical oncology. She was counseled on skin care as well as  measures to avoid sun exposure to this area.  2. Survivorship. She will be seen in survivorship clinic in May 2019. We discussed the utility of this visit.  3. 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 The patient does have what appears to be a sizable seroma and we discussed evaluation with Dr. Brantley Stage. If her symptoms don't improve, she was counseled on an MRI of the c-spine to rule out a pinched nerve. She will follow up with Dr. Brantley Stage first. We will follow up with her next week on her progress.     Carola Rhine, PAC

## 2017-11-12 DIAGNOSIS — M79602 Pain in left arm: Secondary | ICD-10-CM | POA: Diagnosis not present

## 2017-11-12 DIAGNOSIS — Z853 Personal history of malignant neoplasm of breast: Secondary | ICD-10-CM | POA: Diagnosis not present

## 2017-11-20 ENCOUNTER — Telehealth: Payer: Self-pay | Admitting: Family Medicine

## 2017-11-20 NOTE — Telephone Encounter (Signed)
Handicap Placard Application placed in Dr. Rometta Emery in box to complete.

## 2017-11-20 NOTE — Telephone Encounter (Signed)
Patient dropped off handicapped placard form.  Please call patient when form is ready for pick up. Form is in rx tower.

## 2017-11-21 NOTE — Telephone Encounter (Signed)
Mr. Cundy notified that Marilyn's handicap placard application is ready to be picked up at the front desk.

## 2017-11-22 ENCOUNTER — Other Ambulatory Visit: Payer: Self-pay | Admitting: Surgery

## 2017-11-22 DIAGNOSIS — M542 Cervicalgia: Secondary | ICD-10-CM

## 2017-11-22 DIAGNOSIS — M25519 Pain in unspecified shoulder: Secondary | ICD-10-CM

## 2017-11-26 ENCOUNTER — Ambulatory Visit
Admission: RE | Admit: 2017-11-26 | Discharge: 2017-11-26 | Disposition: A | Discharge status: Home / Self Care | Payer: PPO | Source: Ambulatory Visit | Attending: Acute Care | Admitting: Acute Care

## 2017-11-26 ENCOUNTER — Ambulatory Visit: Payer: PPO

## 2017-11-26 DIAGNOSIS — Z87891 Personal history of nicotine dependence: Secondary | ICD-10-CM | POA: Diagnosis not present

## 2017-11-26 DIAGNOSIS — I289 Disease of pulmonary vessels, unspecified: Secondary | ICD-10-CM | POA: Diagnosis not present

## 2017-11-26 DIAGNOSIS — Y842 Radiological procedure and radiotherapy as the cause of abnormal reaction of the patient, or of later complication, without mention of misadventure at the time of the procedure: Secondary | ICD-10-CM | POA: Insufficient documentation

## 2017-11-26 DIAGNOSIS — I700 Atherosclerosis of aorta: Secondary | ICD-10-CM | POA: Insufficient documentation

## 2017-11-26 DIAGNOSIS — I7789 Other specified disorders of arteries and arterioles: Secondary | ICD-10-CM | POA: Diagnosis not present

## 2017-11-26 DIAGNOSIS — J439 Emphysema, unspecified: Secondary | ICD-10-CM | POA: Diagnosis not present

## 2017-11-26 DIAGNOSIS — I2510 Atherosclerotic heart disease of native coronary artery without angina pectoris: Secondary | ICD-10-CM | POA: Insufficient documentation

## 2017-11-26 DIAGNOSIS — Z122 Encounter for screening for malignant neoplasm of respiratory organs: Secondary | ICD-10-CM | POA: Insufficient documentation

## 2017-11-27 ENCOUNTER — Telehealth: Payer: Self-pay | Admitting: Acute Care

## 2017-11-27 DIAGNOSIS — I272 Pulmonary hypertension, unspecified: Secondary | ICD-10-CM

## 2017-11-27 NOTE — Telephone Encounter (Signed)
Please see the results of Melanie Schneider's low dose CT lung cancer screening. We will call her these results. I wanted to ask you if you want me to set her up for a pulmonary consult to evaluate the pulmonary artery enlargement to evaluate for  pulmonary HTN. I know this was an incidental finding, but wanted to follow up if you think it is appropriate. If you would like a pulmonary consult, message me back and we will arrange for it.  From a lung cancer perspective , Recommendation per radiology is 12 month follow up.   I see that she is following up with Dr. Brantley Stage re; seroma.  Thanks so much. Please don't hesitate to contact me with any questions or concerns.   IMPRESSION: 1. Lung-RADS 1, negative. Continue annual screening with low-dose chest CT without contrast in 12 months. 2. Aortic atherosclerosis (ICD10-I70.0), coronary artery atherosclerosis and emphysema (ICD10-J43.9). 3. Pulmonary artery enlargement suggests pulmonary arterial hypertension. 4. Interval surgical changes in the left breast with presumably radiation induced skin thickening. Fluid density lesion in the left axilla is most likely a postoperative seroma or hematoma.

## 2017-11-28 NOTE — Telephone Encounter (Signed)
Okay to set her up with pulmonary to eval further.

## 2017-11-28 NOTE — Telephone Encounter (Signed)
Melanie Schneider, Please place consultation to Pulmonary for suspected pulmonary HTN. Thanks so much

## 2017-11-29 ENCOUNTER — Encounter: Payer: Self-pay | Admitting: Family Medicine

## 2017-11-29 ENCOUNTER — Other Ambulatory Visit: Payer: Self-pay | Admitting: Acute Care

## 2017-11-29 DIAGNOSIS — I700 Atherosclerosis of aorta: Secondary | ICD-10-CM | POA: Insufficient documentation

## 2017-11-29 DIAGNOSIS — Z87891 Personal history of nicotine dependence: Secondary | ICD-10-CM

## 2017-11-29 DIAGNOSIS — Z122 Encounter for screening for malignant neoplasm of respiratory organs: Secondary | ICD-10-CM

## 2017-11-29 DIAGNOSIS — I2510 Atherosclerotic heart disease of native coronary artery without angina pectoris: Secondary | ICD-10-CM | POA: Insufficient documentation

## 2017-11-30 NOTE — Telephone Encounter (Signed)
Ok referral placed.

## 2017-12-03 ENCOUNTER — Other Ambulatory Visit: Payer: Self-pay | Admitting: Family Medicine

## 2018-01-04 ENCOUNTER — Encounter: Payer: Self-pay | Admitting: Internal Medicine

## 2018-01-04 ENCOUNTER — Ambulatory Visit: Payer: PPO | Admitting: Internal Medicine

## 2018-01-04 VITALS — BP 118/82 | HR 70 | Ht 63.0 in | Wt 210.0 lb

## 2018-01-04 DIAGNOSIS — R0609 Other forms of dyspnea: Secondary | ICD-10-CM | POA: Diagnosis not present

## 2018-01-04 DIAGNOSIS — R0600 Dyspnea, unspecified: Secondary | ICD-10-CM | POA: Insufficient documentation

## 2018-01-04 DIAGNOSIS — J449 Chronic obstructive pulmonary disease, unspecified: Secondary | ICD-10-CM | POA: Diagnosis not present

## 2018-01-04 NOTE — Patient Instructions (Signed)
Please see patient coordinator before you leave today  to schedule echocardiogram   Please schedule a follow up office visit in 4 weeks, sooner if needed with pfts on return

## 2018-01-04 NOTE — Progress Notes (Signed)
Subjective:     Patient ID: Melanie Schneider, female   DOB: 07-19-1946,    MRN: [id number]  HPI  57 yowf   quit smoking 2014 referred to pulmonary clinic 01/04/2018 by Dr   Valorie Roosevelt with ? [place] on L[facility]CT done  11/26/17    01/04/2018 1st [facility] office visit/ Leanna Hamid   Chief Complaint  Patient presents with  . Pulmonary Consult    Referred by Dr. Eric Form, NP for eval of pulmonary hypertension based on lung ca screening ct. She states she only gets SOB when she walks up hill or if she gets in a hurry. She has an albuterol inhaler that she uses 2 x per wk on average.   doe x 10 years with nl pfts 02/04/08 and wt 218 and really not much change since in terms of activity tol = MMRC2 = can't walk a nl pace on a flat grade s sob but does fine slow and flat shopping like to lean on cart / only uses hfa when "over does it" Sleeps usually on R side (since L breast surgery)  x 2 pillows and wakes up feeling ok/ no daytime hypersomnolence or irregular breathing noted by husband   No obvious day to day or daytime variability in doe or assoc excess/ purulent sputum or mucus plugs or hemoptysis or cp or chest tightness, subjective wheeze or overt sinus or hb symptoms. No unusual exposure hx or h/o childhood pna/ asthma or knowledge of premature birth.  Sleeping  R side down/ 2 pillows  without nocturnal  or early am exacerbation  of respiratory  c/o's or need for noct saba. Also denies any obvious fluctuation of symptoms with weather or environmental changes or other aggravating or alleviating factors except as outlined above   Current Allergies, Complete Past Medical History, Past Surgical History, Family History, and Social History were reviewed in [employer] record.  ROS  The following are not active complaints unless bolded Hoarseness, sore throat, dysphagia, dental problems, itching, sneezing,  nasal congestion or discharge of excess mucus or purulent secretions, ear  ache,   fever, chills, sweats, unintended wt loss or wt gain, classically pleuritic or exertional cp,  orthopnea pnd or arm/hand swelling  or leg swelling, presyncope, palpitations, abdominal pain, anorexia, nausea, vomiting, diarrhea  or change in bowel habits or change in bladder habits, change in stools or change in urine, dysuria, hematuria,  rash, arthralgias, visual complaints, headache, numbness, weakness or ataxia or problems with walking or coordination,  change in mood or  memory.        Current Meds  Medication Sig  . albuterol (PROVENTIL HFA;VENTOLIN HFA) 108 (90 Base) MCG/ACT inhaler Inhale 2 puffs into the lungs every 6 (six) hours as needed for wheezing or shortness of breath (with aerochamber).  Marland Kitchen atorvastatin (LIPITOR) 40 MG tablet TAKE ONE TABLET BY MOUTH ONCE DAILY  . letrozole (FEMARA) 2.5 MG tablet Take 1 tablet (2.5 mg total) by mouth daily.  . Vitamin D, Ergocalciferol, (DRISDOL) 50000 units CAPS capsule TAKE 1 CAPSULE BY MOUTH ONCE A WEEK           Review of Systems     Objective:   Physical Exam amb somber obese wf nad  Wt Readings from Last 3 Encounters:  01/04/18 210 lb (95.3 kg)  11/26/17 220 lb (99.8 kg)  11/06/17 218 lb 6.4 oz (99.1 kg)     Vital signs reviewed - Note on arrival 02 sats  96% on RA  HEENT: nl dentition, turbinates bilaterally, and oropharynx. Nl external ear canals without cough reflex - Modified Mallampati Score =   3/4   NECK :  without JVD/Nodes/TM/ nl carotid upstrokes bilaterally   LUNGS: no acc muscle use,  Nl contour chest which is clear to A and P bilaterally without cough on insp or exp maneuvers   CV:  RRR  no s3 or murmur  - no def  increase in P2, and no edema   ABD:  soft and nontender with nl inspiratory excursion in the supine position. No bruits or organomegaly appreciated, bowel sounds nl  MS:  Nl gait/ ext warm without deformities, calf tenderness, cyanosis or clubbing No obvious joint restrictions   SKIN:  warm and dry without lesions    NEURO:  alert, approp, nl sensorium with  no motor or cerebellar deficits apparent.      I personally reviewed images and agree with radiology impression as follows:   Chest CT 11/26/17 1. Lung-RADS 1, negative. Continue annual screening with low-dose chest CT without contrast in 12 months. 2. Aortic atherosclerosis (ICD10-I70.0), coronary artery atherosclerosis and emphysema (ICD10-J43.9). 3. Pulmonary artery enlargement suggests pulmonary arterial hypertension.      Assessment:

## 2018-01-05 ENCOUNTER — Encounter: Payer: Self-pay | Admitting: Internal Medicine

## 2018-01-05 NOTE — Assessment & Plan Note (Signed)
Body mass index is 37.2 kg/m.  -  trending down / encouraged  Lab Results  Component Value Date   TSH 2.15 10/09/2016     Contributing to   Doe and may have incipient OHS  /reviewed the need and the process to achieve and maintain neg calorie balance > defer f/u primary care including intermittently monitoring thyroid status

## 2018-01-05 NOTE — Assessment & Plan Note (Signed)
See pfts 02/03/18 did not meet criteria for copd - LDCT 11/27/17 with emphysematous changes  Repeat PFTs rec,  In meantime can continue to use saba prn

## 2018-01-05 NOTE — Assessment & Plan Note (Signed)
-   Echo 05/31/13  : low nl LVfxn, impaired relaxation and Mild LAE plus mild RVE, nl RVSP - PFT's  02/04/08  FEV1 1.94 (86 % ) ratio 74  p 11 % improvement from saba p nothing prior to study with DLCO  68 % corrects to 116  % for alv volume   - LDSCT 11/26/17  ok x for PA enlargement and mild emphysema - 01/04/2018   Walked RA x one lap @ 185 stopped due to  Tired, no sob or desats   - Echo ordered 01/04/2018     Her last HC03 is 30 so she probably has borderline OHS and likely incipient cor pulmonale = WHO III PH, not PAH or any form that would require meds other than assuring she has adequate noct 02 or perhaps cpap/ bipap but will await the echo results first and bring her back for full pfts to explore the doe further.   Total time devoted to counseling  > 50 % of initial 60 min office visit:  review case with pt/husband discussion of options/alternatives/ personally creating written customized instructions  in presence of pt  then going over those specific  Instructions directly with the pt including how to use all of the meds but in particular covering each new medication in detail and the difference between the maintenance= "automatic" meds and the prns using an action plan format for the latter (If this problem/symptom => do that organization reading Left to right).  Please see AVS from this visit for a full list of these instructions which I personally wrote for this pt and  are unique to this visit.

## 2018-01-18 ENCOUNTER — Ambulatory Visit (INDEPENDENT_AMBULATORY_CARE_PROVIDER_SITE_OTHER): Payer: PPO

## 2018-01-18 ENCOUNTER — Other Ambulatory Visit: Payer: Self-pay

## 2018-01-18 DIAGNOSIS — R0609 Other forms of dyspnea: Secondary | ICD-10-CM | POA: Diagnosis not present

## 2018-01-18 DIAGNOSIS — R0600 Dyspnea, unspecified: Secondary | ICD-10-CM

## 2018-01-18 MED ORDER — PERFLUTREN LIPID MICROSPHERE
1.0000 mL | INTRAVENOUS | Status: AC | PRN
  Administered 2018-01-18: 2 mL via INTRAVENOUS

## 2018-01-18 NOTE — Progress Notes (Signed)
Spoke with pt and notified of results per Dr. Wert. Pt verbalized understanding and denied any questions. 

## 2018-01-21 ENCOUNTER — Ambulatory Visit: Payer: PPO | Admitting: Internal Medicine

## 2018-01-21 ENCOUNTER — Encounter: Payer: Self-pay | Admitting: Internal Medicine

## 2018-01-21 VITALS — BP 118/80 | HR 66 | Ht 63.0 in | Wt 209.0 lb

## 2018-01-21 DIAGNOSIS — R0600 Dyspnea, unspecified: Secondary | ICD-10-CM

## 2018-01-21 DIAGNOSIS — R0609 Other forms of dyspnea: Secondary | ICD-10-CM

## 2018-01-21 DIAGNOSIS — J449 Chronic obstructive pulmonary disease, unspecified: Secondary | ICD-10-CM

## 2018-01-21 NOTE — Patient Instructions (Addendum)
Weight control is simply a matter of calorie balance which needs to be tilted in your favor by eating less and exercising more.  To get the most out of exercise, you need to be continuously aware that you are short of breath, but never out of breath, for 30 minutes daily. As you improve, it will actually be easier for you to do the same amount of exercise  in  30 minutes so always push to the level where you are short of breath.  If this does not result in gradual weight reduction then I strongly recommend you see a nutritionist with a food diary x 2 weeks so that we can work out a negative calorie balance which is universally effective in steady weight loss programs.  Think of your calorie balance like you do your bank account where in this case you want the balance to go down so you must take in less calories than you burn up.  It's just that simple:  Hard to do, but easy to understand.  Good luck!     Keep the appt for lung function test - add ono RA before next ov

## 2018-01-21 NOTE — Progress Notes (Signed)
Subjective:     Patient ID: Melanie Schneider, female   DOB: 01-15-1946,    MRN: [id number]    Brief patient profile:  89 yowf   quit smoking 2014 referred to pulmonary clinic 01/04/2018 by Dr   Melanie Schneider with ? [place] on [facility] done  11/26/17 but echo 01/18/18 only with G I diastolic dysfunction with nl RV but tds so could not determine PAS    History of Present Illness  01/04/2018 1st [facility] office visit/ Melanie Schneider   Chief Complaint  Patient presents with  . Pulmonary Consult    Referred by Dr. Eric Form, NP for eval of pulmonary hypertension based on lung ca screening ct. She states she only gets SOB when she walks up hill or if she gets in a hurry. She has an albuterol inhaler that she uses 2 x per wk on average.   doe x 10 years with nl pfts 02/04/08 and wt 218 and really not much change since in terms of activity tol = MMRC2 = can't walk a nl pace on a flat grade s sob but does fine slow and flat shopping like to lean on cart / only uses hfa when "over does it" Sleeps usually on R side (since L breast surgery)  x 2 pillows and wakes up feeling ok/ no daytime hypersomnolence or irregular breathing noted by husband rec Echo >  G I  diastolic dysfunction    0/27/7412  f/u ov/Melanie Schneider re:   ? [place] on LDSCT chest  Chief Complaint  Patient presents with  . Follow-up    discuss ECHO results. No new co's today. She is using her albuterol inhaler once per wk on average.   Dyspnea:  MMRC3 = can't walk 100 yards even at a slow pace at a flat grade s stopping due to sob  = [employer]/ pushing cart  Cough: no Sleep: R side down / horizontal but 2 pillows  SABA use:  None  No obvious day to day or daytime variability or assoc excess/ purulent sputum or mucus plugs or hemoptysis or cp or chest tightness, subjective wheeze or overt sinus or hb symptoms. No unusual exposure hx or h/o childhood pna/ asthma or knowledge of premature birth.  Sleeping  A above   without nocturnal  or early am exacerbation   of respiratory  c/o's or need for noct saba. Also denies any obvious fluctuation of symptoms with weather or environmental changes or other aggravating or alleviating factors except as outlined above   Current Allergies, Complete Past Medical History, Past Surgical History, Family History, and Social History were reviewed in [employer] record.  ROS  The following are not active complaints unless bolded Hoarseness, sore throat, dysphagia, dental problems, itching, sneezing,  nasal congestion or discharge of excess mucus or purulent secretions, ear ache,   fever, chills, sweats, unintended wt loss or wt gain, classically pleuritic or exertional cp,  orthopnea pnd or arm/hand swelling  or leg swelling, presyncope, palpitations, abdominal pain, anorexia, nausea, vomiting, diarrhea  or change in bowel habits or change in bladder habits, change in stools or change in urine, dysuria, hematuria,  rash, arthralgias, visual complaints, headache, numbness, weakness or ataxia or problems with walking or coordination,  change in mood or  memory.        Current Meds  Medication Sig  . albuterol (PROVENTIL HFA;VENTOLIN HFA) 108 (90 Base) MCG/ACT inhaler Inhale 2 puffs into the lungs every 6 (six) hours as needed for wheezing  or shortness of breath (with aerochamber).  Marland Kitchen atorvastatin (LIPITOR) 40 MG tablet TAKE ONE TABLET BY MOUTH ONCE DAILY  . letrozole (FEMARA) 2.5 MG tablet Take 1 tablet (2.5 mg total) by mouth daily.  . Vitamin D, Ergocalciferol, (DRISDOL) 50000 units CAPS capsule TAKE 1 CAPSULE BY MOUTH ONCE A WEEK                   Objective:   Physical Exam    amb somber obese wf nad  01/21/2018       209   01/04/18 210 lb (95.3 kg)  11/26/17 220 lb (99.8 kg)  11/06/17 218 lb 6.4 oz (99.1 kg)    Vital signs reviewed - Note on arrival 02 sats  96% on RA and bp 118/80         HEENT: nl dentition, turbinates bilaterally, and oropharynx. Nl external ear canals  without cough reflex - Modified Mallampati Score =   3/4   NECK :  without JVD/Nodes/TM/ nl carotid upstrokes bilaterally   LUNGS: no acc muscle use,  Nl contour chest which is clear to A and P bilaterally without cough on insp or exp maneuvers   CV:  RRR  no s3 or murmur or increase in P2, and no edema   ABD:  Quite obese but soft and nontender with nl inspiratory excursion in the supine position. No bruits or organomegaly appreciated, bowel sounds nl  MS:  Nl gait/ ext warm without deformities, calf tenderness, cyanosis or clubbing No obvious joint restrictions   SKIN: warm and dry without lesions    NEURO:  alert, approp, nl sensorium with  no motor or cerebellar deficits apparent.                   Assessment:

## 2018-01-22 ENCOUNTER — Encounter: Payer: Self-pay | Admitting: Internal Medicine

## 2018-01-22 ENCOUNTER — Telehealth: Payer: Self-pay | Admitting: *Deleted

## 2018-01-22 DIAGNOSIS — J449 Chronic obstructive pulmonary disease, unspecified: Secondary | ICD-10-CM

## 2018-01-22 NOTE — Telephone Encounter (Signed)
Spoke with the pt and notified of recs per MW  She verbalized understanding  ONO on RA was ord

## 2018-01-22 NOTE — Assessment & Plan Note (Addendum)
See pfts 02/03/18 did not meet criteria for copd - LDCT 11/27/17 with emphysematous changes - Repeat PFTs scheduled - this may give a better understanding of degree the emphysema seen on ct vs the obesity effects explain her doe/ advised to keep appt and see me same day to go over them.

## 2018-01-22 NOTE — Assessment & Plan Note (Signed)
-   Echo 05/31/13  : low nl LVfxn, impaired relaxation and Mild LAE plus mild RVE, nl RVSP - PFT's  02/04/08  FEV1 1.94 (86 % ) ratio 74  p 11 % improvement from saba p nothing prior to study with DLCO  68 % corrects to 116  % for alv volume   - LDSCT 11/26/17  ok x for PA enlargement and mild emphysema - 01/04/2018   Walked RA x one lap @ 185 stopped due to  Tired, no sob or desats   - Echo 3/36/1224 >>>  GI  diastolic dysfunction with nl RV but tds so could not determine PAS and she is def at risk of noct hypoxemia/ osa so rec screening ono RA and work on wt loss, the most likely cause of her problems, pending f/u with pfts planned  > 50 % of this 25 min ov spent reviewing data/ counseling.

## 2018-01-22 NOTE — Telephone Encounter (Signed)
-----   Message from Tanda Rockers, MD sent at 01/22/2018  5:14 AM EDT ----- Needs   ono RA before next ov

## 2018-01-22 NOTE — Assessment & Plan Note (Signed)
Body mass index is 37.02 kg/m.  -  trending no change  Lab Results  Component Value Date   TSH 2.15 10/09/2016     Contributing to gerd risk/ doe/reviewed the need and the process to achieve and maintain neg calorie balance > defer f/u primary care including intermittently monitoring thyroid status

## 2018-01-23 ENCOUNTER — Telehealth: Payer: Self-pay

## 2018-01-23 NOTE — Telephone Encounter (Signed)
Spoke with patient reminding of SCP visit with NP on 01/29/18 at 1pm.  Patient says she will come to appt.

## 2018-01-29 ENCOUNTER — Encounter: Payer: Self-pay | Admitting: Adult Health

## 2018-01-29 ENCOUNTER — Inpatient Hospital Stay: Payer: PPO | Attending: Adult Health | Admitting: Adult Health

## 2018-01-29 ENCOUNTER — Telehealth: Payer: Self-pay | Admitting: Adult Health

## 2018-01-29 VITALS — BP 137/64 | HR 64 | Temp 97.5°F | Resp 18 | Ht 63.0 in | Wt 209.8 lb

## 2018-01-29 DIAGNOSIS — M8580 Other specified disorders of bone density and structure, unspecified site: Secondary | ICD-10-CM

## 2018-01-29 DIAGNOSIS — Z170 Estrogen receptor positive status [ER+]: Secondary | ICD-10-CM | POA: Insufficient documentation

## 2018-01-29 DIAGNOSIS — C50212 Malignant neoplasm of upper-inner quadrant of left female breast: Secondary | ICD-10-CM | POA: Insufficient documentation

## 2018-01-29 DIAGNOSIS — Z79811 Long term (current) use of aromatase inhibitors: Secondary | ICD-10-CM
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
## 2018-01-29 NOTE — Telephone Encounter (Signed)
Per 5/7 no los faxed [facility]

## 2018-02-08 ENCOUNTER — Encounter: Payer: Self-pay | Admitting: Internal Medicine

## 2018-02-08 ENCOUNTER — Ambulatory Visit (INDEPENDENT_AMBULATORY_CARE_PROVIDER_SITE_OTHER): Payer: PPO | Admitting: Internal Medicine

## 2018-02-08 ENCOUNTER — Other Ambulatory Visit: Payer: Self-pay | Admitting: Internal Medicine

## 2018-02-08 ENCOUNTER — Encounter: Payer: PPO | Admitting: Internal Medicine

## 2018-02-08 DIAGNOSIS — R0609 Other forms of dyspnea: Secondary | ICD-10-CM | POA: Diagnosis not present

## 2018-02-08 DIAGNOSIS — R0600 Dyspnea, unspecified: Secondary | ICD-10-CM

## 2018-02-08 LAB — PULMONARY FUNCTION TEST
DL/VA % PRED: 112 %
DL/VA: 5.28 ml/min/mmHg/L
DLCO UNC: 16.93 ml/min/mmHg
DLCO unc % pred: 73 %
FEF 25-75 POST: 1.21 L/s
FEF 25-75 Pre: 0.68 L/sec
FEF2575-%CHANGE-POST: 78 %
FEF2575-%PRED-POST: 68 %
FEF2575-%Pred-Pre: 38 %
FEV1-%CHANGE-POST: 20 %
FEV1-%PRED-PRE: 50 %
FEV1-%Pred-Post: 61 %
FEV1-POST: 1.3 L
FEV1-Pre: 1.07 L
FEV1FVC-%CHANGE-POST: 6 %
FEV1FVC-%PRED-PRE: 87 %
FEV6-%CHANGE-POST: 13 %
FEV6-%PRED-PRE: 60 %
FEV6-%Pred-Post: 68 %
FEV6-Post: 1.84 L
FEV6-Pre: 1.62 L
FEV6FVC-%PRED-POST: 105 %
FEV6FVC-%Pred-Pre: 105 %
FVC-%CHANGE-POST: 13 %
FVC-%PRED-POST: 65 %
FVC-%Pred-Pre: 57 %
FVC-POST: 1.84 L
FVC-Pre: 1.62 L
POST FEV1/FVC RATIO: 71 %
PRE FEV1/FVC RATIO: 66 %
PRE FEV6/FVC RATIO: 100 %
Post FEV6/FVC ratio: 100 %
RV % PRED: 143 %
RV: 3.11 L
TLC % pred: 98 %
TLC: 4.81 L

## 2018-02-08 MED ORDER — BUDESONIDE-FORMOTEROL FUMARATE 80-4.5 MCG/ACT IN AERO: 2.0000 | INHALATION_SPRAY | Freq: Two times a day (BID) | RESPIRATORY_TRACT | 1 refills | Status: DC

## 2018-02-08 NOTE — Progress Notes (Signed)
PFT completed today.  

## 2018-02-08 NOTE — Progress Notes (Signed)
Spoke with pt and notified of results per Dr. Wert. Pt verbalized understanding and denied any questions. 

## 2018-02-12 ENCOUNTER — Other Ambulatory Visit: Payer: Self-pay | Admitting: Family Medicine

## 2018-02-12 MED ORDER — VITAMIN D3 1.25 MG (50000 UT) PO TABS: 1.0000 | ORAL_TABLET | ORAL | 1 refills | Status: DC

## 2018-02-12 NOTE — Telephone Encounter (Signed)
Should be vit d3 not d2.  Will refill correctly.

## 2018-02-12 NOTE — Telephone Encounter (Signed)
Last office visit 10/16/2017.  Last refilled 08/20/2017 for #12 with 1 refill.  Last Vit D level 01/15/2017 low at 10.68 ng/ml.  Refill?

## 2018-02-20 ENCOUNTER — Encounter: Payer: Self-pay | Admitting: Internal Medicine

## 2018-02-20 ENCOUNTER — Encounter: Payer: Self-pay | Admitting: *Deleted

## 2018-02-20 ENCOUNTER — Telehealth: Payer: Self-pay | Admitting: Internal Medicine

## 2018-02-20 DIAGNOSIS — G4734 Idiopathic sleep related nonobstructive alveolar hypoventilation: Secondary | ICD-10-CM | POA: Insufficient documentation

## 2018-02-20 NOTE — Telephone Encounter (Signed)
Per MW- ONO (done by [facility] on 02/08/18) showed that she does qualify for o2 if she wants, or can discuss at next [facility] with pt and notified of results per Dr. Melvyn Novas. Pt verbalized understanding and denied any questions. Pt scheduled for ov 03/14/18 (this is the soonest she could come due to needing a late morning appt)

## 2018-03-14 ENCOUNTER — Ambulatory Visit: Payer: PPO | Admitting: Internal Medicine

## 2018-03-14 ENCOUNTER — Encounter: Payer: Self-pay | Admitting: Internal Medicine

## 2018-03-14 VITALS — BP 122/68 | HR 65 | Ht 63.0 in | Wt 200.6 lb

## 2018-03-14 DIAGNOSIS — R0609 Other forms of dyspnea: Secondary | ICD-10-CM | POA: Diagnosis not present

## 2018-03-14 DIAGNOSIS — J449 Chronic obstructive pulmonary disease, unspecified: Secondary | ICD-10-CM | POA: Diagnosis not present

## 2018-03-14 DIAGNOSIS — R0600 Dyspnea, unspecified: Secondary | ICD-10-CM

## 2018-03-14 DIAGNOSIS — G4734 Idiopathic sleep related nonobstructive alveolar hypoventilation: Secondary | ICD-10-CM | POA: Diagnosis not present

## 2018-03-14 NOTE — Assessment & Plan Note (Signed)
See pfts 02/03/18 did not meet criteria for copd - LDCT 11/27/17 with emphysematous changes - Repeat PFTs   02/08/2018    FEV1 1.30 (61 % ) ratio 71  p 20 % improvement from saba p ? prior to study with DLCO  73 % corrects to 112  % for alv volume  And min curvature p saba  rec trial of symb 80 2bid and f/u in 6 weeks - 03/14/2018  After extensive coaching inhaler device  effectiveness =    75% (short Ti)   Despite suboptimal hfa baseline she's doing better and luckily only has very mild AB component for which she could really use symb prn based on Based on two studies from NEJM  378; 20 p 1865 (2018) and 380 : p2020-30 (2019) in pts with mild asthma it is reasonable to use low dose symbicort eg 80 2bid "prn" flare in this setting but I emphasized this was only shown with symbicort and takes advantage of the rapid onset of action but is not the same as "rescue therapy" but can be stopped once the acute symptoms have resolved and the need for rescue has been minimized (< 2 x weekly)

## 2018-03-14 NOTE — Assessment & Plan Note (Signed)
-   Echo 05/31/13  : low nl LVfxn, impaired relaxation and Mild LAE plus mild RVE, nl RVSP - PFT's  02/04/08  FEV1 1.94 (86 % ) ratio 74  p 11 % improvement from saba p nothing prior to study with DLCO  68 % corrects to 116  % for alv volume   - LDSCT 11/26/17  ok x for PA enlargement and mild emphysema - 01/04/2018   Walked RA x one lap @ 185 stopped due to  Tired, no sob or desats  - Echo 2/59/5638 >>>  GI  diastolic dysfunction with nl RV but tds so could not determine PAS   Improving slowly with wt loss/ symbicort 80 2bid  (see copd) so no further w/u for now   F/u in 3 m

## 2018-03-14 NOTE — Progress Notes (Signed)
Subjective:     Patient ID: Melanie Schneider, female   DOB: Oct 25, 1945,    MRN: [id number]    Brief patient profile:  41 yowf   quit smoking 2014 referred to pulmonary clinic 01/04/2018 by Dr   Valorie Roosevelt with ? [place] on LDSCT done  11/26/17 but echo 01/18/18 only with G I diastolic dysfunction with nl RV but tds so could not determine PAS with GOLD 0 copd criteria 02/08/18     History of Present Illness  01/04/2018 1st [facility] office visit/ [facility]   Chief Complaint  Patient presents with  . Pulmonary Consult    Referred by Dr. Eric Form, NP for eval of pulmonary hypertension based on lung ca screening ct. She states she only gets SOB when she walks up hill or if she gets in a hurry. She has an albuterol inhaler that she uses 2 x per wk on average.   doe x 10 years with nl pfts 02/04/08 and wt 218 and really not much change since in terms of activity tol = MMRC2 = can't walk a nl pace on a flat grade s sob but does fine slow and flat shopping like to lean on cart / only uses hfa when "over does it" Sleeps usually on R side (since L breast surgery)  x 2 pillows and wakes up feeling ok/ no daytime hypersomnolence or irregular breathing noted by husband rec Echo >  G I  diastolic dysfunction    12/16/4008  f/u ov/[facility] re:   ? [place] on LDSCT chest  Chief Complaint  Patient presents with  . Follow-up    discuss ECHO results. No new co's today. She is using her albuterol inhaler once per wk on average.   Dyspnea:  MMRC3 = can't walk 100 yards even at a slow pace at a flat grade s stopping due to sob  = [employer]/ pushing cart  Cough: no Sleep: R side down / horizontal but 2 pillows  SABA use:  None rec Wt loss ono RA 02/08/18  25 min at < 89%  :  Declined 02    - Repeat PFTs   02/08/2018    FEV1 1.30 (61 % ) ratio 71  p 20 % improvement from saba p ? prior to study with DLCO  73 % corrects to 112  % for alv volume  And min curvature p saba  rec trial of symb 80 2bid and f/u in 6  weeks    03/14/2018  f/u ov/[facility] re:  GOLD 0 copd/ ab  Chief Complaint  Patient presents with  . Follow-up    to discuss ONO results from [facility]. Patient states she has been feeling well since last visit.   Dyspnea:  Doing [employer] a little faster  Cough: some am congestion nasal  Sleep: 2 pillows/ horizontal s am HA SABA use:  Once a week at most    No obvious day to day or daytime variability or assoc excess/ purulent sputum or mucus plugs or hemoptysis or cp or chest tightness, subjective wheeze or overt sinus or hb symptoms. No unusual exposure hx or h/o childhood pna/ asthma or knowledge of premature birth.  Sleeping  2 pillows hoizontal   without nocturnal  or early am exacerbation  of respiratory  c/o's or need for noct saba. Also denies any obvious fluctuation of symptoms with weather or environmental changes or other aggravating or alleviating factors except as outlined above   Current Allergies, Complete Past Medical  History, Past Surgical History, Family History, and Social History were reviewed in [employer] record.  ROS  The following are not active complaints unless bolded Hoarseness, sore throat, dysphagia, dental problems, itching, sneezing,  nasal congestion or discharge of excess mucus or purulent secretions, ear ache,   fever, chills, sweats, unintended wt loss or wt gain, classically pleuritic or exertional cp,  orthopnea pnd or arm/hand swelling  or leg swelling, presyncope, palpitations, abdominal pain, anorexia, nausea, vomiting, diarrhea  or change in bowel habits or change in bladder habits, change in stools or change in urine, dysuria, hematuria,  rash, arthralgias, visual complaints, headache, numbness, weakness or ataxia or problems with walking or coordination,  change in mood or  memory.        Current Meds  Medication Sig  . albuterol (PROVENTIL HFA;VENTOLIN HFA) 108 (90 Base) MCG/ACT inhaler Inhale 2 puffs into the lungs every 6 (six)  hours as needed for wheezing or shortness of breath (with aerochamber).  Marland Kitchen atorvastatin (LIPITOR) 40 MG tablet TAKE ONE TABLET BY MOUTH ONCE DAILY  . budesonide-formoterol (SYMBICORT) 80-4.5 MCG/ACT inhaler Inhale 2 puffs into the lungs 2 (two) times daily.  . Cholecalciferol (VITAMIN D3) 50000 units TABS Take 1 tablet by mouth once a week.  . letrozole (FEMARA) 2.5 MG tablet Take 1 tablet (2.5 mg total) by mouth daily.                Objective:   Physical Exam   amb somber wf nad   03/14/2018       200  01/21/2018       209   01/04/18 210 lb (95.3 kg)  11/26/17 220 lb (99.8 kg)  11/06/17 218 lb 6.4 oz (99.1 kg)     Vital signs reviewed - Note on arrival 02 sats  97% on RA       HEENT: nl dentition, turbinates bilaterally, and oropharynx. Nl external ear canals without cough reflex Modified Mallampati Score =   3/4   NECK :  without JVD/Nodes/TM/ nl carotid upstrokes bilaterally   LUNGS: no acc muscle use,  Nl contour chest which is clear to A and P bilaterally without cough on insp or exp maneuvers   CV:  RRR  no s3 or murmur or increase in P2, and no edema   ABD: mod obese  soft and nontender with nl inspiratory excursion in the supine position. No bruits or organomegaly appreciated, bowel sounds nl  MS:  Nl gait/ ext warm without deformities, calf tenderness, cyanosis or clubbing No obvious joint restrictions   SKIN: warm and dry without lesions    NEURO:  alert, approp, nl sensorium with  no motor or cerebellar deficits apparent.                   Assessment:

## 2018-03-14 NOTE — Patient Instructions (Signed)
Work on inhaler technique:  relax and gently blow all the way out then take a nice smooth deep breath back in, triggering the inhaler at same time you start breathing in.  Hold for up to 5 seconds if you can. Blow out thru nose. Rinse and gargle with water when done      Please schedule a follow up visit in 3 months but call sooner if needed

## 2018-03-14 NOTE — Assessment & Plan Note (Signed)
ono RA 02/08/18  25 min at < 89%  -   03/14/2018  Declined 02 rx   Discussed in detail all the  indications, usual  risks and alternatives  relative to the benefits with patient who agrees to proceed with conservative f/u as outlined  With wt loss, rx of AB and no 02 unless more convincing signs/ symptoms of PH or any nocturnal or early am resp complaints, am ha etc   I had an extended discussion with the patient reviewing all relevant studies completed to date and  lasting 15 to 20 minutes of a 25 minute visit    Each maintenance medication was reviewed in detail including most importantly the difference between maintenance and prns and under what circumstances the prns are to be triggered using an action plan format that is not reflected in the computer generated alphabetically organized AVS.    See device teaching which extended face to face time for this visit   Please see AVS for specific instructions unique to this visit that I personally wrote and verbalized to the the pt in detail and then reviewed with pt  by my nurse highlighting any  changes in therapy recommended at today's visit to their plan of care.

## 2018-03-25 ENCOUNTER — Ambulatory Visit: Payer: PPO | Admitting: Internal Medicine

## 2018-03-28 ENCOUNTER — Other Ambulatory Visit: Payer: Self-pay | Admitting: Family Medicine

## 2018-04-01 ENCOUNTER — Other Ambulatory Visit: Payer: Self-pay | Admitting: Family Medicine

## 2018-04-01 NOTE — Telephone Encounter (Signed)
Name of Medication: Alprazolam Name of Pharmacy: [facility] or Written Date and Quantity: 05/22/2017 for #30 with no refills *Not on current medication list* Last Office Visit and Type: 10/16/2017 for CPE Next Office Visit and Type: 04/12/2018 for Medication Refill Last Controlled Substance Agreement Date: None on file Last UDS: None on file

## 2018-04-03 DIAGNOSIS — Z853 Personal history of malignant neoplasm of breast: Secondary | ICD-10-CM | POA: Diagnosis not present

## 2018-04-03 DIAGNOSIS — K5909 Other constipation: Secondary | ICD-10-CM | POA: Diagnosis not present

## 2018-04-12 ENCOUNTER — Encounter: Payer: Self-pay | Admitting: Family Medicine

## 2018-04-12 ENCOUNTER — Other Ambulatory Visit (INDEPENDENT_AMBULATORY_CARE_PROVIDER_SITE_OTHER): Payer: PPO

## 2018-04-12 ENCOUNTER — Ambulatory Visit (INDEPENDENT_AMBULATORY_CARE_PROVIDER_SITE_OTHER): Payer: PPO | Admitting: Family Medicine

## 2018-04-12 ENCOUNTER — Other Ambulatory Visit: Payer: Self-pay | Admitting: Family Medicine

## 2018-04-12 DIAGNOSIS — I10 Essential (primary) hypertension: Secondary | ICD-10-CM

## 2018-04-12 DIAGNOSIS — E559 Vitamin D deficiency, unspecified: Secondary | ICD-10-CM

## 2018-04-12 LAB — VITAMIN D 25 HYDROXY (VIT D DEFICIENCY, FRACTURES): VITD: 47.71 ng/mL (ref 30.00–100.00)

## 2018-05-29 ENCOUNTER — Telehealth: Payer: Self-pay | Admitting: Hematology and Oncology

## 2018-05-29 NOTE — Telephone Encounter (Signed)
Spoke to pt regarding vm she left earlier to r/s an appt.

## 2018-06-03 NOTE — Progress Notes (Signed)
cancelled

## 2018-06-17 ENCOUNTER — Encounter: Payer: Self-pay | Admitting: Internal Medicine

## 2018-06-17 ENCOUNTER — Ambulatory Visit (INDEPENDENT_AMBULATORY_CARE_PROVIDER_SITE_OTHER): Payer: PPO | Admitting: Internal Medicine

## 2018-06-17 VITALS — BP 120/74 | HR 75 | Ht 63.0 in | Wt 193.0 lb

## 2018-06-17 DIAGNOSIS — J449 Chronic obstructive pulmonary disease, unspecified: Secondary | ICD-10-CM

## 2018-06-17 DIAGNOSIS — G4734 Idiopathic sleep related nonobstructive alveolar hypoventilation: Secondary | ICD-10-CM | POA: Diagnosis not present

## 2018-06-17 DIAGNOSIS — R0609 Other forms of dyspnea: Secondary | ICD-10-CM | POA: Diagnosis not present

## 2018-06-17 DIAGNOSIS — R0600 Dyspnea, unspecified: Secondary | ICD-10-CM

## 2018-06-17 NOTE — Patient Instructions (Addendum)
Work on inhaler technique:  relax and gently blow all the way out then take a nice smooth deep breath back in, triggering the inhaler at same time you start breathing in.  Hold for up to 5 seconds if you can. Blow out thru nose. Rinse and gargle with water when done.  If you are satisfied with your treatment plan,  let your doctor know and he/she can either refill your medications or you can return here when your prescription runs out.     If in any way you are not 100% satisfied,  please tell us.  If 100% better, tell your friends!  Pulmonary follow up is as needed   

## 2018-06-17 NOTE — Progress Notes (Signed)
Subjective:     Patient ID: Melanie Schneider, female   DOB: February 27, 1946,    MRN: [id number]    Brief patient profile:  50 yowf   quit smoking 2014 referred to pulmonary clinic 01/04/2018 by Dr   Valorie Roosevelt with ? [place] on LDSCT done  11/26/17 but echo 01/18/18 only with G I diastolic dysfunction with nl RV but tds so could not determine PAS with GOLD 0 copd criteria 02/08/18     History of Present Illness  01/04/2018 1st [facility] office visit/ Melanie Schneider   Chief Complaint  Patient presents with  . Pulmonary Consult    Referred by Dr. Eric Form, NP for eval of pulmonary hypertension based on lung ca screening ct. She states she only gets SOB when she walks up hill or if she gets in a hurry. She has an albuterol inhaler that she uses 2 x per wk on average.   doe x 10 years with nl pfts 02/04/08 and wt 218 and really not much change since in terms of activity tol = MMRC2 = can't walk a nl pace on a flat grade s sob but does fine slow and flat shopping like to lean on cart / only uses hfa when "over does it" Sleeps usually on R side (since L breast surgery)  x 2 pillows and wakes up feeling ok/ no daytime hypersomnolence or irregular breathing noted by husband rec Echo >  G I  diastolic dysfunction    11/30/6576  f/u ov/Melanie Schneider re:   ? [place] on LDSCT chest  Chief Complaint  Patient presents with  . Follow-up    discuss ECHO results. No new co's today. She is using her albuterol inhaler once per wk on average.   Dyspnea:  MMRC3 = can't walk 100 yards even at a slow pace at a flat grade s stopping due to sob  = [employer]/ pushing cart  Cough: no Sleep: R side down / horizontal but 2 pillows  SABA use:  None rec Wt loss ono RA 02/08/18  25 min at < 89% :  Declined 02    - Repeat PFTs   02/08/2018    FEV1 1.30 (61 % ) ratio 71  p 20 % improvement from saba p ? prior to study with DLCO  73 % corrects to 112  % for alv volume  And min curvature p saba  rec trial of symb 80 2bid and f/u in 6  weeks    03/14/2018  f/u ov/Melanie Schneider re:  GOLD 0 copd/ ab on symb  Chief Complaint  Patient presents with  . Follow-up    to discuss ONO results from [facility]. Patient states she has been feeling well since last visit.   Dyspnea:  Doing [employer] a little faster  Cough: some am congestion nasal  Sleep: 2 pillows/ horizontal s am HA SABA use:  Once a week at most  rec Continue  symbicort 80 2bid  Work on inhaler technique:       06/17/2018  f/u ov/Melanie Schneider re: GOLD 0 /ab on symbicort 80 2bid  Chief Complaint  Patient presents with  . Follow-up    Breathing is doing well. She uses her albuterol inhaler once per wk on average. She states she woke up yesterday morning and had lost partial vision in her right eye for approx 1 hour.  Dyspnea:  No change doe at [employer]  Cough: none Sleeping:  Fine flat   SABA use: rare 02: none  Viz changes = some blurring, not loss of viz (no amaurosis) and pt not clear whether  A viz field vs one eye but 100% resolved w/in an hour.  No obvious day to day or daytime variability or assoc excess/ purulent sputum or mucus plugs or hemoptysis or cp or chest tightness, subjective wheeze or overt sinus or hb symptoms.   Sleeping as above  without nocturnal  or early am exacerbation  of respiratory  c/o's or need for noct saba. Also denies any obvious fluctuation of symptoms with weather or environmental changes or other aggravating or alleviating factors except as outlined above   No unusual exposure hx or h/o childhood pna/ asthma or knowledge of premature birth.  Current Allergies, Complete Past Medical History, Past Surgical History, Family History, and Social History were reviewed in [employer] record.  ROS  The following are not active complaints unless bolded Hoarseness, sore throat, dysphagia, dental problems, itching, sneezing,  nasal congestion or discharge of excess mucus or purulent secretions, ear ache,   fever, chills, sweats,  unintended wt loss or wt gain, classically pleuritic or exertional cp,  orthopnea pnd or arm/hand swelling  or leg swelling, presyncope, palpitations, abdominal pain, anorexia, nausea, vomiting, diarrhea  or change in bowel habits or change in bladder habits, change in stools or change in urine, dysuria, hematuria,  rash, arthralgias, visual complaints, headache, numbness, weakness or ataxia or problems with walking or coordination,  change in mood or  memory.        Current Meds  Medication Sig  . albuterol (PROVENTIL HFA;VENTOLIN HFA) 108 (90 Base) MCG/ACT inhaler Inhale 2 puffs into the lungs every 6 (six) hours as needed for wheezing or shortness of breath (with aerochamber).  . ALPRAZolam (XANAX) 0.25 MG tablet TAKE 1 TABLET BY MOUTH ONCE DAILY AS NEEDED FOR ANXIETY  . atorvastatin (LIPITOR) 40 MG tablet TAKE ONE TABLET BY MOUTH ONCE DAILY  . budesonide-formoterol (SYMBICORT) 80-4.5 MCG/ACT inhaler Inhale 2 puffs into the lungs 2 (two) times daily.  Marland Kitchen letrozole (FEMARA) 2.5 MG tablet Take 1 tablet (2.5 mg total) by mouth daily.               Objective:   Physical Exam   amb slow moving wf nad   06/17/2018      193  03/14/2018       200  01/21/2018       209   01/04/18 210 lb (95.3 kg)  11/26/17 220 lb (99.8 kg)  11/06/17 218 lb 6.4 oz (99.1 kg)    Vital signs reviewed - Note on arrival 02 sats  95% on RA         HEENT: nl dentition, turbinates bilaterally, and oropharynx. Nl external ear canals without cough reflex Modified Mallampati Score =   3/4  NECK :  without JVD/Nodes/TM/ nl carotid upstrokes bilaterally   LUNGS: no acc muscle use,  Nl contour chest which is clear to A and P bilaterally without cough on insp or exp maneuvers   CV:  RRR  no s3 or murmur or increase in P2, and no edema   ABD:  soft and nontender with nl inspiratory excursion in the supine position. No bruits or organomegaly appreciated, bowel sounds nl  MS:  slow gait/ ext warm without  deformities, calf tenderness, cyanosis or clubbing No obvious joint restrictions   SKIN: warm and dry without lesions    NEURO:  alert, approp, nl sensorium with  no motor or cerebellar  deficits apparent.             Assessment:

## 2018-06-18 ENCOUNTER — Encounter: Payer: Self-pay | Admitting: Internal Medicine

## 2018-06-18 NOTE — Assessment & Plan Note (Addendum)
ono RA 02/08/18  25 min at < 89%  -   03/14/2018  Declined 02 rx     Discussed in detail all the  indications, usual  risks and alternatives  relative to the benefits with patient who agrees to proceed with conservative f/u as declines to use 02 and has lost wt since original dx>>  Likely this has helped   Pulmonary f/u can be prn

## 2018-06-18 NOTE — Assessment & Plan Note (Signed)
-   Echo 05/31/13  : low nl LVfxn, impaired relaxation and Mild LAE plus mild RVE, nl RVSP - PFT's  02/04/08  FEV1 1.94 (86 % ) ratio 74  p 11 % improvement from saba p nothing prior to study with DLCO  68 % corrects to 116  % for alv volume   - LDSCT 11/26/17  ok x for PA enlargement and mild emphysema - 01/04/2018   Walked RA x one lap @ 185 stopped due to  Tired, no sob or desats  - Echo 0/37/5436 >>>  GI  diastolic dysfunction with nl RV but tds so could not determine PAS   Slowly improving with wt loss and rx with symbicort 80 2bid > no change needed

## 2018-06-18 NOTE — Assessment & Plan Note (Signed)
See pfts 02/03/18 did not meet criteria for copd - LDCT 11/27/17 with emphysematous changes - Repeat PFTs   02/08/2018    FEV1 1.30 (61 % ) ratio 71  p 20 % improvement from saba p ? prior to study with DLCO  73 % corrects to 112  % for alv volume  And min curvature p saba  rec trial of symb 80 2bid and f/u in 6 weeks - 06/17/2018  After extensive coaching inhaler device,  effectiveness =    75%  >>  Continue symbicort 80 2bid   rx as mild copd/ AB for now  F/u q 3 months   I had an extended discussion with the patient reviewing all relevant studies completed to date and  lasting 15 to 20 minutes of a 25 minute visit    See device teaching which extended face to face time for this visit.  Each maintenance medication was reviewed in detail including emphasizing most importantly the difference between maintenance and prns and under what circumstances the prns are to be triggered using an action plan format that is not reflected in the computer generated alphabetically organized AVS which I have not found useful in most complex patients, especially with respiratory illnesses  Please see AVS for specific instructions unique to this visit that I personally wrote and verbalized to the the pt in detail and then reviewed with pt  by my nurse highlighting any  changes in therapy recommended at today's visit to their plan of care.

## 2018-06-24 DIAGNOSIS — Z853 Personal history of malignant neoplasm of breast: Secondary | ICD-10-CM | POA: Diagnosis not present

## 2018-06-24 DIAGNOSIS — M8589 Other specified disorders of bone density and structure, multiple sites: Secondary | ICD-10-CM | POA: Diagnosis not present

## 2018-06-24 DIAGNOSIS — R922 Inconclusive mammogram: Secondary | ICD-10-CM | POA: Diagnosis not present

## 2018-06-24 LAB — HM MAMMOGRAPHY

## 2018-06-25 ENCOUNTER — Encounter: Payer: Self-pay | Admitting: Family Medicine

## 2018-06-27 ENCOUNTER — Telehealth: Payer: Self-pay | Admitting: Hematology and Oncology

## 2018-06-27 ENCOUNTER — Inpatient Hospital Stay: Payer: PPO | Attending: Hematology and Oncology | Admitting: Hematology and Oncology

## 2018-06-27 DIAGNOSIS — Z79811 Long term (current) use of aromatase inhibitors: Secondary | ICD-10-CM | POA: Insufficient documentation

## 2018-06-27 DIAGNOSIS — C50212 Malignant neoplasm of upper-inner quadrant of left female breast: Secondary | ICD-10-CM | POA: Insufficient documentation

## 2018-06-27 DIAGNOSIS — Z170 Estrogen receptor positive status [ER+]: Secondary | ICD-10-CM | POA: Diagnosis not present

## 2018-06-27 NOTE — Telephone Encounter (Signed)
Gave patient avs and calendar.   °

## 2018-06-27 NOTE — Progress Notes (Signed)
Patient Care Team: Jinny Sanders, MD as PCP - General Erroll Luna, MD as Consulting Physician (General Surgery) Nicholas Lose, MD as Consulting Physician (Hematology and Oncology) Kyung Rudd, MD as Consulting Physician (Radiation Oncology) Delice Bison Charlestine Massed, NP as Nurse Practitioner (Hematology and Oncology)  DIAGNOSIS:  Encounter Diagnosis  Name Primary?  . Malignant neoplasm of upper-inner quadrant of left breast in female, estrogen receptor positive ([place])     SUMMARY OF ONCOLOGIC HISTORY:   Malignant neoplasm of upper-inner quadrant of left breast in female, estrogen receptor positive ([place])   06/26/2017 Initial Diagnosis    Screening detected right breast calcifications and architectural distortion on the left breast; right breast calcs 1.1 cm normal biopsy DCIS ER 90%, PR 95%, grade 1-2; Tis Nx stage 0 left breast distortion 1.8 cm mass by ultrasound biopsy: IDC with DCIS ER 95%, PR 5%, Ki-67 15%, HER-2 negative ratio 1.39, grade 1-2, T1c N0 stage IA clinical stage    07/24/2017 Surgery    Left lumpectomy: IDC grade 2, 1.5 cm, intermediate grade DCIS, anterior margin broadly positive, posterior margin focally positive, lymphovascular invasion present, 0/2 lymph nodes negative T1CN0 stage I a    08/29/2017 - 10/01/2017 Radiation Therapy    Adjuvant radiation therapy    10/2017 -  Anti-estrogen oral therapy    Letrozole daily     CHIEF COMPLIANT: Follow-up on letrozole therapy  INTERVAL HISTORY: Melanie Schneider is a 72 year old with above-mentioned history of left breast cancer treated with lumpectomy with several close and positive margins.  Reexcision was not performed and radiation was prescribed.  She is currently on oral antiestrogen therapy with letrozole.  She is tolerating it fairly well.  She does not have any hot flashes or myalgias.  Her biggest issue is forgetting to take the medication.  Her husband is going to help her with it.  REVIEW OF SYSTEMS:    Constitutional: Denies fevers, chills or abnormal weight loss Eyes: Denies blurriness of vision Ears, nose, mouth, throat, and face: Denies mucositis or sore throat Respiratory: Denies cough, dyspnea or wheezes Cardiovascular: Denies palpitation, chest discomfort Gastrointestinal:  Denies nausea, heartburn or change in bowel habits Skin: Denies abnormal skin rashes Lymphatics: Denies new lymphadenopathy or easy bruising Neurological:Denies numbness, tingling or new weaknesses Behavioral/Psych: Mood is stable, no new changes  Extremities: No lower extremity edema Breast:  denies any pain or lumps or nodules in either breasts All other systems were reviewed with the patient and are negative.  I have reviewed the past medical history, past surgical history, social history and family history with the patient and they are unchanged from previous note.  ALLERGIES:  is allergic to iodine; other; povidone-iodine; adhesive [tape]; and latex.  MEDICATIONS:  Current Outpatient Medications  Medication Sig Dispense Refill  . albuterol (PROVENTIL HFA;VENTOLIN HFA) 108 (90 Base) MCG/ACT inhaler Inhale 2 puffs into the lungs every 6 (six) hours as needed for wheezing or shortness of breath (with aerochamber). 1 Inhaler 3  . ALPRAZolam (XANAX) 0.25 MG tablet TAKE 1 TABLET BY MOUTH ONCE DAILY AS NEEDED FOR ANXIETY 30 tablet 0  . atorvastatin (LIPITOR) 40 MG tablet TAKE ONE TABLET BY MOUTH ONCE DAILY 90 tablet 3  . budesonide-formoterol (SYMBICORT) 80-4.5 MCG/ACT inhaler Inhale 2 puffs into the lungs 2 (two) times daily. 1 Inhaler 1  . letrozole (FEMARA) 2.5 MG tablet Take 1 tablet (2.5 mg total) by mouth daily. 90 tablet 3   No current facility-administered medications for this visit.    Facility-Administered Medications  Ordered in Other Visits  Medication Dose Route Frequency Provider Last Rate Last Dose  . SONAFINE emulsion 1 application  1 application Topical BID Hayden Pedro, [place]   1  application at 03/50/09 1521    PHYSICAL EXAMINATION: ECOG PERFORMANCE STATUS: 1 - Symptomatic but completely ambulatory  Vitals:   06/27/18 1034  BP: 108/68  Pulse: 82  Resp: 18  Temp: 97.8 F (36.6 C)  SpO2: 95%   Filed Weights   06/27/18 1034  Weight: 188 lb 1.6 oz (85.3 kg)    GENERAL:alert, no distress and comfortable SKIN: skin color, texture, turgor are normal, no rashes or significant lesions EYES: normal, Conjunctiva are pink and non-injected, sclera clear OROPHARYNX:no exudate, no erythema and lips, buccal mucosa, and tongue normal  NECK: supple, thyroid normal size, non-tender, without nodularity LYMPH:  no palpable lymphadenopathy in the cervical, axillary or inguinal LUNGS: clear to auscultation and percussion with normal breathing effort HEART: regular rate & rhythm and no murmurs and no lower extremity edema ABDOMEN:abdomen soft, non-tender and normal bowel sounds MUSCULOSKELETAL:no cyanosis of digits and no clubbing  NEURO: alert & oriented x 3 with fluent speech, no focal motor/sensory deficits EXTREMITIES: No lower extremity edema   LABORATORY DATA:  I have reviewed the data as listed CMP Latest Ref Rng & Units 10/12/2017 07/04/2017 01/15/2017  Glucose 70 - 99 mg/dL 99 109 98  BUN 6 - 23 mg/dL 14 12.1 12  Creatinine 0.40 - 1.20 mg/dL 0.56 0.7 0.62  Sodium 135 - 145 mEq/L 140 142 139  Potassium 3.5 - 5.1 mEq/L 4.3 3.7 4.3  Chloride 96 - 112 mEq/L 105 - 104  CO2 19 - 32 mEq/L '30 27 29  '$ Calcium 8.4 - 10.5 mg/dL 8.8 8.7 9.3  Total Protein 6.0 - 8.3 g/dL 6.5 6.7 7.0  Total Bilirubin 0.2 - 1.2 mg/dL 0.7 0.59 0.6  Alkaline Phos 39 - 117 U/L 64 79 79  AST 0 - 37 U/L '15 9 11  '$ ALT 0 - 35 U/L '12 9 9    '$ Lab Results  Component Value Date   WBC 7.6 07/04/2017   HGB 12.6 07/04/2017   HCT 38.9 07/04/2017   MCV 85.2 07/04/2017   PLT 267 07/04/2017   NEUTROABS 5.0 07/04/2017    ASSESSMENT & PLAN:  Malignant neoplasm of upper-inner quadrant of left breast in  female, estrogen receptor positive ([place]) 07/24/2017 left lumpectomy: IDC grade 2, 1.5 cm, intermediate grade DCIS, anterior margin broadly positive, posterior margin focally positive, lymphovascular invasion present, 0/2 lymph nodes negative T1CN0 stage I a  Right lumpectomy: DCIS grade 2 Tis NX stage 0 adjuvant radiation therapy 08/29/17- 10/01/17  Recommendations: Adjuvant antiestrogen therapy with Letrozole 2.5 mg daily started October 26, 2017  Letrozole toxicities: Denies any hot flashes or myalgias.  Breast cancer surveillance: 1.  Breast exam May 2019 2. Mammogram 06/24/2018  RTC in 1 year for follow-up    No orders of the defined types were placed in this encounter.  The patient has a good understanding of the overall plan. she agrees with it. she will call with any problems that may develop before the next visit here.   Harriette Ohara, MD 06/27/18

## 2018-06-27 NOTE — Assessment & Plan Note (Addendum)
07/24/2017 left lumpectomy: IDC grade 2, 1.5 cm, intermediate grade DCIS, anterior margin broadly positive, posterior margin focally positive, lymphovascular invasion present, 0/2 lymph nodes negative T1CN0 stage I a  Right lumpectomy: DCIS grade 2 Tis NX stage 0 adjuvant radiation therapy 08/29/17- 10/01/17  Recommendations: Adjuvant antiestrogen therapy with Letrozole 2.5 mg daily started October 26, 2017  Letrozole toxicities:    Breast cancer surveillance: 1.  Breast exam May 2019 2. Mammogram 06/24/2018  RTC in 1 year for follow-up

## 2018-08-29 ENCOUNTER — Ambulatory Visit (INDEPENDENT_AMBULATORY_CARE_PROVIDER_SITE_OTHER): Payer: PPO

## 2018-08-29 DIAGNOSIS — Z23 Encounter for immunization: Secondary | ICD-10-CM | POA: Diagnosis not present

## 2018-09-19 ENCOUNTER — Other Ambulatory Visit: Payer: Self-pay

## 2018-09-19 MED ORDER — LETROZOLE 2.5 MG PO TABS: 2.5000 mg | ORAL_TABLET | Freq: Every day | ORAL | 0 refills | Status: DC

## 2018-10-13 ENCOUNTER — Other Ambulatory Visit: Payer: Self-pay | Admitting: Hematology and Oncology

## 2018-10-15 ENCOUNTER — Telehealth: Payer: Self-pay | Admitting: Family Medicine

## 2018-10-15 ENCOUNTER — Ambulatory Visit: Payer: PPO

## 2018-10-15 DIAGNOSIS — E7800 Pure hypercholesterolemia, unspecified: Secondary | ICD-10-CM

## 2018-10-15 DIAGNOSIS — R7309 Other abnormal glucose: Secondary | ICD-10-CM

## 2018-10-15 DIAGNOSIS — E538 Deficiency of other specified B group vitamins: Secondary | ICD-10-CM

## 2018-10-15 NOTE — Telephone Encounter (Signed)
-----   Message from Eustace Pen, LPN sent at 01/30/2256  3:54 PM EST ----- Regarding: Labs 1/21 Lab orders needed. Thank you.

## 2018-10-17 ENCOUNTER — Ambulatory Visit (INDEPENDENT_AMBULATORY_CARE_PROVIDER_SITE_OTHER): Payer: Medicare HMO

## 2018-10-17 VITALS — BP 100/62 | HR 64 | Temp 97.7°F | Ht 63.75 in | Wt 179.0 lb

## 2018-10-17 DIAGNOSIS — R7309 Other abnormal glucose: Secondary | ICD-10-CM

## 2018-10-17 DIAGNOSIS — Z0000 Encounter for general adult medical examination without abnormal findings: Secondary | ICD-10-CM | POA: Diagnosis not present

## 2018-10-17 DIAGNOSIS — E7800 Pure hypercholesterolemia, unspecified: Secondary | ICD-10-CM

## 2018-10-17 DIAGNOSIS — E538 Deficiency of other specified B group vitamins: Secondary | ICD-10-CM

## 2018-10-17 LAB — LIPID PANEL
Cholesterol: 166 mg/dL (ref 0–200)
HDL: 39.8 mg/dL (ref >39.00)
LDL Cholesterol: 108 mg/dL — ABNORMAL HIGH (ref 0–99)
NonHDL: 125.84
Total CHOL/HDL Ratio: 4
Triglycerides: 87 mg/dL (ref 0.0–149.0)
VLDL: 17.4 mg/dL (ref 0.0–40.0)

## 2018-10-17 LAB — COMPREHENSIVE METABOLIC PANEL
ALBUMIN: 3.9 g/dL (ref 3.5–5.2)
ALK PHOS: 54 U/L (ref 39–117)
ALT: 10 U/L (ref 0–35)
AST: 13 U/L (ref 0–37)
BILIRUBIN TOTAL: 0.5 mg/dL (ref 0.2–1.2)
BUN: 17 mg/dL (ref 6–23)
CO2: 29 mEq/L (ref 19–32)
Calcium: 9.1 mg/dL (ref 8.4–10.5)
Chloride: 105 mEq/L (ref 96–112)
Creatinine, Ser: 0.67 mg/dL (ref 0.40–1.20)
GFR: 86.41 mL/min (ref >60.00)
Glucose, Bld: 108 mg/dL — ABNORMAL HIGH (ref 70–99)
POTASSIUM: 4.6 meq/L (ref 3.5–5.1)
Sodium: 139 mEq/L (ref 135–145)
Total Protein: 6.9 g/dL (ref 6.0–8.3)

## 2018-10-17 LAB — VITAMIN B12: Vitamin B-12: 156 pg/mL — ABNORMAL LOW (ref 211–911)

## 2018-10-17 LAB — HEMOGLOBIN A1C: Hgb A1c MFr Bld: 5.8 % (ref 4.6–6.5)

## 2018-10-17 NOTE — Patient Instructions (Addendum)
Melanie Schneider , Thank you for taking time to come for your Medicare Wellness Visit. I appreciate your ongoing commitment to your health goals. Please review the following plan we discussed and let me know if I can assist you in the future.   These are the goals we discussed: Goals    . Follow up with Primary Care Provider     Starting 10/17/2018, I will continue to take medications as prescribed and to keep appointments with PCP as scheduled.        This is a list of the screening recommended for you and due dates:  Health Maintenance  Topic Date Due  . Tetanus Vaccine  05/27/2019  . Mammogram  06/25/2019  . Colon Cancer Screening  09/05/2025  . Flu Shot  Completed  . DEXA scan (bone density measurement)  Completed  .  Hepatitis C: One time screening is recommended by Center for Disease Control  (CDC) for  adults born from [birth year] through 1965.   Completed  . Pneumonia vaccines  Completed   Preventive Care for Adults  A healthy lifestyle and preventive care can promote health and wellness. Preventive health guidelines for adults include the following key practices.  . A routine yearly physical is a good way to check with your health care provider about your health and preventive screening. It is a chance to share any concerns and updates on your health and to receive a thorough exam.  . Visit your dentist for a routine exam and preventive care every 6 months. Brush your teeth twice a day and floss once a day. Good oral hygiene prevents tooth decay and gum disease.  . The frequency of eye exams is based on your age, health, family medical history, use  of contact lenses, and other factors. Follow your health care provider's recommendations for frequency of eye exams.  . Eat a healthy diet. Foods like vegetables, fruits, whole grains, low-fat dairy products, and lean protein foods contain the nutrients you need without too many calories. Decrease your intake of foods high in solid fats,  added sugars, and salt. Eat the right amount of calories for you. Get information about a proper diet from your health care provider, if necessary.  . Regular physical exercise is one of the most important things you can do for your health. Most adults should get at least 150 minutes of moderate-intensity exercise (any activity that increases your heart rate and causes you to sweat) each week. In addition, most adults need muscle-strengthening exercises on 2 or more days a week.  Silver Sneakers may be a benefit available to you. To determine eligibility, you may visit the website: www.silversneakers.com or contact program at 418-120-1779 Mon-Fri between 8AM-8PM.   . Maintain a healthy weight. The body mass index (BMI) is a screening tool to identify possible weight problems. It provides an estimate of body fat based on height and weight. Your health care provider can find your BMI and can help you achieve or maintain a healthy weight.   For adults 20 years and older: ? A BMI below 18.5 is considered underweight. ? A BMI of 18.5 to 24.9 is normal. ? A BMI of 25 to 29.9 is considered overweight. ? A BMI of 30 and above is considered obese.   . Maintain normal blood lipids and cholesterol levels by exercising and minimizing your intake of saturated fat. Eat a balanced diet with plenty of fruit and vegetables. Blood tests for lipids and cholesterol should begin at age  20 and be repeated every 5 years. If your lipid or cholesterol levels are high, you are over 50, or you are at high risk for heart disease, you may need your cholesterol levels checked more frequently. Ongoing high lipid and cholesterol levels should be treated with medicines if diet and exercise are not working.  . If you smoke, find out from your health care provider how to quit. If you do not use tobacco, please do not start.  . If you choose to drink alcohol, please do not consume more than 2 drinks per day. One drink is  considered to be 12 ounces (355 mL) of beer, 5 ounces (148 mL) of wine, or 1.5 ounces (44 mL) of liquor.  . If you are 58-33 years old, ask your health care provider if you should take aspirin to prevent strokes.  . Use sunscreen. Apply sunscreen liberally and repeatedly throughout the day. You should seek shade when your shadow is shorter than you. Protect yourself by wearing long sleeves, pants, a wide-brimmed hat, and sunglasses year round, whenever you are outdoors.  . Once a month, do a whole body skin exam, using a mirror to look at the skin on your back. Tell your health care provider of new moles, moles that have irregular borders, moles that are larger than a pencil eraser, or moles that have changed in shape or color.
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
## 2018-10-17 NOTE — Progress Notes (Signed)
PCP notes:   Health maintenance:  No gaps identified.  Abnormal screenings:   Hearing - failed  Hearing Screening   125Hz  250Hz  500Hz  1000Hz  2000Hz  3000Hz  4000Hz  6000Hz  8000Hz   Right ear:   40 40 40  0    Left ear:   40 40 40  0      Visual Acuity Screening   Right eye Left eye Both eyes  Without correction:     With correction: 20/25 20/20 20/20     Patient concerns:   Acute cough with yellow sputum. Onset: 3-4 days ago. Causes SOB. Using rescue inhaler. Acute visit scheduled 10/18/18 with PCP for further evaluation. Patient encouraged to increase water intake.   Nurse concerns:  Patient is non-adherent to doctor instructions regarding Atorvastatin and Symbicort. States Symbicort is too expensive and the powder from inhaler irritates her throat.  Next PCP appt:   10/22/18 @ 1400

## 2018-10-18 ENCOUNTER — Encounter: Payer: Self-pay | Admitting: Family Medicine

## 2018-10-18 ENCOUNTER — Ambulatory Visit (INDEPENDENT_AMBULATORY_CARE_PROVIDER_SITE_OTHER): Payer: Medicare HMO | Admitting: Family Medicine

## 2018-10-18 VITALS — BP 100/60 | HR 77 | Temp 97.7°F | Ht 63.75 in | Wt 178.5 lb

## 2018-10-18 DIAGNOSIS — J069 Acute upper respiratory infection, unspecified: Secondary | ICD-10-CM | POA: Diagnosis not present

## 2018-10-18 DIAGNOSIS — B9789 Other viral agents as the cause of diseases classified elsewhere: Secondary | ICD-10-CM | POA: Diagnosis not present

## 2018-10-18 DIAGNOSIS — J449 Chronic obstructive pulmonary disease, unspecified: Secondary | ICD-10-CM

## 2018-10-18 NOTE — Assessment & Plan Note (Signed)
Stable. Follwoed by Gal[place].

## 2018-10-18 NOTE — Patient Instructions (Signed)
Mucinex DM twice daily.  Rest, Fluids.  Call if if shortness of breath returns, worsening instead of improving or new fever... call for likely prednisone and antibiotic.

## 2018-10-18 NOTE — Assessment & Plan Note (Signed)
Improving 90%.. continue supportive care... no clear sign of COPD exacerbation or bacterial infeciton.

## 2018-10-18 NOTE — Progress Notes (Signed)
Subjective:    Patient ID: Melanie Schneider, female    DOB: Mar 21, 1946, 73 y.o.   MRN: [id number]  Cough  This is a new problem. The current episode started in the past 7 days. The problem has been gradually improving. The cough is productive of sputum and productive of purulent sputum. Associated symptoms include ear pain, nasal congestion, a sore throat and shortness of breath. Pertinent negatives include no ear congestion, fever, myalgias or wheezing. Associated symptoms comments: Left ear pain. The symptoms are aggravated by lying down. Risk factors for lung disease include smoking/tobacco exposure. Treatments tried:  nasal saline, coricidin. The treatment provided no relief. Her past medical history is significant for COPD.  She feels 90% better in last few days.  She is not taking Symbicort as expensive and irritates her throat.  Former smoker  COPD, mild.. followed by Dr. Melvyn Novas.  Social History /Family History/Past Medical History reviewed in detail and updated in EMR if needed. Blood pressure 100/60, pulse 77, temperature 97.7 F (36.5 C), temperature source Oral, height 5' 3.75" (1.619 m), weight 178 lb 8 oz (81 kg), SpO2 95 %.    sick contacts.Marland Kitchen at hospital with sister visits.  Review of Systems  Constitutional: Negative for fever.  HENT: Positive for ear pain and sore throat.   Respiratory: Positive for cough and shortness of breath. Negative for wheezing.   Musculoskeletal: Negative for myalgias.       Objective:   Physical Exam Constitutional:      General: She is not in acute distress.    Appearance: She is well-developed. She is obese. She is not ill-appearing or toxic-appearing.  HENT:     Head: Normocephalic.     Right Ear: Hearing, tympanic membrane, ear canal and external ear normal. Tympanic membrane is not erythematous, retracted or bulging.     Left Ear: Hearing, tympanic membrane, ear canal and external ear normal. Tympanic membrane is not erythematous,  retracted or bulging.     Nose: Mucosal edema and rhinorrhea present.     Right Sinus: No maxillary sinus tenderness or frontal sinus tenderness.     Left Sinus: No maxillary sinus tenderness or frontal sinus tenderness.     Mouth/Throat:     Pharynx: Uvula midline.  Eyes:     General: Lids are normal. Lids are everted, no foreign bodies appreciated.     Conjunctiva/sclera: Conjunctivae normal.     Pupils: Pupils are equal, round, and reactive to light.  Neck:     Musculoskeletal: Normal range of motion and neck supple.     Thyroid: No thyroid mass or thyromegaly.     Vascular: No carotid bruit.     Trachea: Trachea normal.  Cardiovascular:     Rate and Rhythm: Normal rate and regular rhythm.     Pulses: Normal pulses.     Heart sounds: Normal heart sounds, S1 normal and S2 normal. No murmur. No friction rub. No gallop.   Pulmonary:     Effort: Pulmonary effort is normal. No tachypnea or respiratory distress.     Breath sounds: Normal breath sounds. No decreased breath sounds, wheezing, rhonchi or rales.  Skin:    General: Skin is warm and dry.     Findings: No rash.  Neurological:     Mental Status: She is alert.  Psychiatric:        Mood and Affect: Mood is not anxious or depressed.        Speech: Speech normal.  Behavior: Behavior normal. Behavior is cooperative.        Judgment: Judgment normal.           Assessment & Plan:

## 2018-10-22 ENCOUNTER — Other Ambulatory Visit: Payer: Self-pay | Admitting: Hematology and Oncology

## 2018-10-22 ENCOUNTER — Encounter: Payer: Self-pay | Admitting: Family Medicine

## 2018-10-22 ENCOUNTER — Ambulatory Visit (INDEPENDENT_AMBULATORY_CARE_PROVIDER_SITE_OTHER): Payer: Medicare HMO | Admitting: Family Medicine

## 2018-10-22 VITALS — BP 140/76 | HR 73 | Temp 97.5°F | Ht 63.75 in | Wt 179.5 lb

## 2018-10-22 DIAGNOSIS — R7309 Other abnormal glucose: Secondary | ICD-10-CM

## 2018-10-22 DIAGNOSIS — I5032 Chronic diastolic (congestive) heart failure: Secondary | ICD-10-CM | POA: Diagnosis not present

## 2018-10-22 DIAGNOSIS — G609 Hereditary and idiopathic neuropathy, unspecified: Secondary | ICD-10-CM

## 2018-10-22 DIAGNOSIS — I10 Essential (primary) hypertension: Secondary | ICD-10-CM | POA: Diagnosis not present

## 2018-10-22 DIAGNOSIS — E7800 Pure hypercholesterolemia, unspecified: Secondary | ICD-10-CM | POA: Diagnosis not present

## 2018-10-22 DIAGNOSIS — Z0000 Encounter for general adult medical examination without abnormal findings: Secondary | ICD-10-CM

## 2018-10-22 DIAGNOSIS — E538 Deficiency of other specified B group vitamins: Secondary | ICD-10-CM

## 2018-10-22 DIAGNOSIS — J449 Chronic obstructive pulmonary disease, unspecified: Secondary | ICD-10-CM

## 2018-10-22 NOTE — Patient Instructions (Addendum)
Restart B12 1000 mcg daily.  Working on low carb diet,exercise and weight loss.  Restart Lipitor daily for cholesterol.

## 2018-10-22 NOTE — Assessment & Plan Note (Signed)
Goal < 100.Marland Kitchen given past CVA.Marland Kitchen restart lipitor.

## 2018-10-22 NOTE — Assessment & Plan Note (Signed)
Low carb diet 

## 2018-10-22 NOTE — Assessment & Plan Note (Signed)
Replete

## 2018-10-22 NOTE — Progress Notes (Signed)
Subjective:    Patient ID: Melanie Schneider, female    DOB: 05/09/1946, 73 y.o.   MRN: [id number]  HPI The patient presents for annual medicare wellness, complete physical and review of chronic health problems. He/She also has the following acute concerns today: none  The patient saw Candis Musa, LPN for medicare wellness. Note reviewed in detail and important notes copied below.  Health maintenance:  No gaps identified.  Abnormal screenings:   Hearing - failed             Hearing Screening   125Hz  250Hz  500Hz  1000Hz  2000Hz  3000Hz  4000Hz  6000Hz  8000Hz   Right ear:   40 40 40  0    Left ear:   40 40 40  0           Visual Acuity Screening   Right eye Left eye Both eyes  Without correction:     With correction: 20/25 20/20 20/20      10/22/18  Ductal carcinoma in situ: 07/24/2017 left lumpectomy: IDC grade 2, 1.5 cm, intermediate grade DCIS, anterior margin broadly positive, posterior margin focally positive, lymphovascular invasion present, 0/2 lymph nodes negative T1CN0 stage I a Right lumpectomy: DCIS grade 2 Tis NX stage 0 adjuvant radiation therapy 08/29/17- 10/01/17  Lumpectomy and radiation,  On Letrozole for antiestrogen therapy x 5 years.  Onc Dr. Lindi Adie.  Bilateral peripheral neuropathy: tolerable control.   Elevated Cholesterol:   Not at goal  Off statin .Marland Kitchen 14 %  10 year risk Lab Results  Component Value Date   CHOL 166 10/17/2018   HDL 39.80 10/17/2018   LDLCALC 108 (H) 10/17/2018   LDLDIRECT 119.9 10/07/2013   TRIG 87.0 10/17/2018   CHOLHDL 4 10/17/2018  Using medications without problems: none Muscle aches: none Diet compliance: moderate  Exercise:none   Wt Readings from Last 3 Encounters:  10/22/18 179 lb 8 oz (81.4 kg)  10/18/18 178 lb 8 oz (81 kg)  10/17/18 179 lb (81.2 kg)    Other complaints:  Prediabetes:  Good control on  diet Lab Results  Component Value Date   HGBA1C 5.8 10/17/2018     Hypertension:   Good control on no med.        BP Readings from Last 3 Encounters:  10/22/18 140/76  10/18/18 100/60  10/17/18 100/62  Using medication without problems or lightheadedness:  none Chest pain with exertion: none Occ pain in site of breast cancer radiation.. completed 2 weeks ago. Edema: none Short of breath: stable Average home BPs: Other issues:  COPD moderate control  Cannot afford symbicort. Using only as needed given cost. Not requiring albuterol rescue.  Recent viral URI.. significant improvement.  No SOB.   Situational anxiety:  Rare issue where more anxious. Using alprazolam low  Dose rarely. Uses more during the holidays.  Social History /Family History/Past Medical History reviewed in detail and updated in EMR if needed. Blood pressure 140/76, pulse 73, temperature (!) 97.5 F (36.4 C), temperature source Oral, height 5' 3.75" (1.619 m), weight 179 lb 8 oz (81.4 kg), SpO2 96 %.  Review of Systems  Constitutional: Negative for fatigue and fever.  HENT: Negative for congestion.   Eyes: Negative for pain.  Respiratory: Negative for cough and shortness of breath.   Cardiovascular: Negative for chest pain, palpitations and leg swelling.  Gastrointestinal: Negative for abdominal pain.  Genitourinary: Negative for dysuria and vaginal bleeding.  Musculoskeletal: Negative for back pain.  Neurological: Negative for syncope, light-headedness and headaches.  Psychiatric/Behavioral: Negative  for dysphoric mood.       Objective:   Physical Exam Constitutional:      General: She is not in acute distress.    Appearance: Normal appearance. She is well-developed. She is not ill-appearing or toxic-appearing.     Comments: Central obesity  walks with cane  HENT:     Head: Normocephalic.     Right Ear: Hearing, tympanic membrane, ear canal and external ear normal. Tympanic membrane is not erythematous, retracted or bulging.     Left Ear: Hearing, tympanic  membrane, ear canal and external ear normal. Tympanic membrane is not erythematous, retracted or bulging.     Nose: Nose normal. No mucosal edema or rhinorrhea.     Right Sinus: No maxillary sinus tenderness or frontal sinus tenderness.     Left Sinus: No maxillary sinus tenderness or frontal sinus tenderness.     Mouth/Throat:     Pharynx: Uvula midline.  Eyes:     General: Lids are normal. Lids are everted, no foreign bodies appreciated.     Conjunctiva/sclera: Conjunctivae normal.     Pupils: Pupils are equal, round, and reactive to light.  Neck:     Musculoskeletal: Normal range of motion and neck supple.     Thyroid: No thyroid mass or thyromegaly.     Vascular: No carotid bruit.     Trachea: Trachea normal.  Cardiovascular:     Rate and Rhythm: Normal rate and regular rhythm.     Pulses: Normal pulses.     Heart sounds: Normal heart sounds, S1 normal and S2 normal. No murmur. No friction rub. No gallop.   Pulmonary:     Effort: Pulmonary effort is normal. No tachypnea or respiratory distress.     Breath sounds: Normal breath sounds. No decreased breath sounds, wheezing, rhonchi or rales.  Abdominal:     General: Bowel sounds are normal. There is no distension or abdominal bruit.     Palpations: Abdomen is soft. There is no fluid wave or mass.     Tenderness: There is no abdominal tenderness. There is no guarding or rebound.     Hernia: No hernia is present.  Lymphadenopathy:     Cervical: No cervical adenopathy.  Skin:    General: Skin is warm and dry.     Findings: No rash.  Neurological:     Mental Status: She is alert.     Cranial Nerves: No cranial nerve deficit.     Sensory: Sensory deficit present.     Coordination: Coordination abnormal.     Gait: Gait abnormal.     Comments:  Decreased sensation in bilateral legs, chronic venous stasis changes and varicosities   Psychiatric:        Mood and Affect: Mood is not anxious or depressed.        Speech: Speech normal.         Behavior: Behavior normal. Behavior is cooperative.        Thought Content: Thought content normal.        Judgment: Judgment normal.           Assessment & Plan:  The patient's preventative maintenance and recommended screening tests for an annual wellness exam were reviewed in full today. Brought up to date unless services declined.  Counselled on the importance of diet, exercise, and its role in overall health and mortality. The patient's FH and SH was reviewed, including their home life, tobacco status, and drug and alcohol status.   Vaccines:uptodate except  shingles. Pap/DVE:not indicated, s/p TAH, sister with ovarian cancer Mammo:personal hx of  breast cancer followed by ONC Bone Density: 2019 Colon:08/2015, pan repeat in 10 years. Smoking Status:former smoker, > 40 years in lung cancer screen protocol. ETOH/ drug use:no ETOH in several years/ occ marijuana use Hep C:done

## 2018-10-22 NOTE — Assessment & Plan Note (Signed)
Good control on no med with weight loss.

## 2018-10-22 NOTE — Assessment & Plan Note (Signed)
Tolerable control using Symbicort off and on.

## 2018-10-22 NOTE — Assessment & Plan Note (Signed)
No fluid overload

## 2018-10-22 NOTE — Assessment & Plan Note (Signed)
Stable control on no med.

## 2018-11-28 ENCOUNTER — Ambulatory Visit
Admission: RE | Admit: 2018-11-28 | Discharge: 2018-11-28 | Disposition: A | Discharge status: Home / Self Care | Payer: Medicare HMO | Source: Ambulatory Visit | Attending: Acute Care | Admitting: Acute Care

## 2018-11-28 ENCOUNTER — Other Ambulatory Visit: Payer: Self-pay

## 2018-11-28 ENCOUNTER — Ambulatory Visit: Admission: RE | Admit: 2018-11-28 | Payer: Medicare HMO | Source: Ambulatory Visit

## 2018-11-28 DIAGNOSIS — Z87891 Personal history of nicotine dependence: Secondary | ICD-10-CM | POA: Insufficient documentation

## 2018-11-28 DIAGNOSIS — Z122 Encounter for screening for malignant neoplasm of respiratory organs: Secondary | ICD-10-CM | POA: Diagnosis not present

## 2018-12-04 ENCOUNTER — Other Ambulatory Visit: Payer: Self-pay | Admitting: Internal Medicine

## 2018-12-04 DIAGNOSIS — R911 Solitary pulmonary nodule: Secondary | ICD-10-CM

## 2018-12-04 NOTE — Progress Notes (Signed)
Spoke with pt and notified of results per Dr. Melvyn Novas. Pt verbalized understanding and denied any questions. Copy sent to Dr Diona Browner and LDCT ordered to be done 11/2019

## 2018-12-05 ENCOUNTER — Other Ambulatory Visit: Payer: Self-pay | Admitting: Acute Care

## 2018-12-13 NOTE — Progress Notes (Signed)
I reviewed health advisor's note, was available for consultation, and agree with documentation and plan.  

## 2018-12-16 NOTE — Progress Notes (Signed)
Spoke with the pt and notified of recs, appt was scheduled

## 2018-12-23 ENCOUNTER — Other Ambulatory Visit: Payer: Self-pay | Admitting: Family Medicine

## 2018-12-23 NOTE — Telephone Encounter (Signed)
Last office visit 10/22/2018 for CPE.  Last refilled 04/12/2018 for #30 with no refills.  Next Appt: 10/28/2019 for CPE.

## 2019-01-27 DIAGNOSIS — K5900 Constipation, unspecified: Secondary | ICD-10-CM | POA: Diagnosis not present

## 2019-01-27 DIAGNOSIS — Z79811 Long term (current) use of aromatase inhibitors: Secondary | ICD-10-CM | POA: Diagnosis not present

## 2019-01-27 DIAGNOSIS — Z87891 Personal history of nicotine dependence: Secondary | ICD-10-CM | POA: Diagnosis not present

## 2019-01-27 DIAGNOSIS — E785 Hyperlipidemia, unspecified: Secondary | ICD-10-CM | POA: Diagnosis not present

## 2019-01-27 DIAGNOSIS — E669 Obesity, unspecified: Secondary | ICD-10-CM | POA: Diagnosis not present

## 2019-01-27 DIAGNOSIS — Z803 Family history of malignant neoplasm of breast: Secondary | ICD-10-CM | POA: Diagnosis not present

## 2019-01-27 DIAGNOSIS — Z8673 Personal history of transient ischemic attack (TIA), and cerebral infarction without residual deficits: Secondary | ICD-10-CM | POA: Diagnosis not present

## 2019-01-27 DIAGNOSIS — J449 Chronic obstructive pulmonary disease, unspecified: Secondary | ICD-10-CM | POA: Diagnosis not present

## 2019-01-27 DIAGNOSIS — C50919 Malignant neoplasm of unspecified site of unspecified female breast: Secondary | ICD-10-CM | POA: Diagnosis not present

## 2019-01-27 DIAGNOSIS — Z7951 Long term (current) use of inhaled steroids: Secondary | ICD-10-CM | POA: Diagnosis not present

## 2019-02-18 ENCOUNTER — Encounter: Payer: Self-pay | Admitting: Internal Medicine

## 2019-02-18 ENCOUNTER — Ambulatory Visit (INDEPENDENT_AMBULATORY_CARE_PROVIDER_SITE_OTHER): Payer: Medicare HMO | Admitting: Internal Medicine

## 2019-02-18 ENCOUNTER — Other Ambulatory Visit: Payer: Self-pay

## 2019-02-18 DIAGNOSIS — J449 Chronic obstructive pulmonary disease, unspecified: Secondary | ICD-10-CM | POA: Diagnosis not present

## 2019-02-18 NOTE — Progress Notes (Signed)
Subjective:     Patient ID: Melanie Schneider, female   DOB: [birth year],    MRN: [id number]    Brief patient profile:  9 yowf   quit smoking 2014 referred to pulmonary clinic 01/04/2018 by Dr   Valorie Roosevelt with ? [place] on LDSCT done  11/26/17 but echo 01/18/18 only with G I diastolic dysfunction with nl RV but tds so could not determine PAS with GOLD 0 copd criteria 02/08/18     History of Present Illness  01/04/2018 1st [place] [facility] office visit/ [facility]   Chief Complaint  Patient presents with  . Pulmonary Consult    Referred by Dr. Eric Form, NP for eval of pulmonary hypertension based on lung ca screening ct. She states she only gets SOB when she walks up hill or if she gets in a hurry. She has an albuterol inhaler that she uses 2 x per wk on average.   doe x 10 years with nl pfts 02/04/08 and wt 218 and really not much change since in terms of activity tol = MMRC2 = can't walk a nl pace on a flat grade s sob but does fine slow and flat shopping like to lean on cart / only uses hfa when "over does it" Sleeps usually on R side (since L breast surgery)  x 2 pillows and wakes up feeling ok/ no daytime hypersomnolence or irregular breathing noted by husband rec Echo >  G I  diastolic dysfunction    4/66/5993  f/u ov/Wert re:   ? [place] on LDSCT chest  Chief Complaint  Patient presents with  . Follow-up    discuss ECHO results. No new co's today. She is using her albuterol inhaler once per wk on average.   Dyspnea:  MMRC3 = can't walk 100 yards even at a slow pace at a flat grade s stopping due to sob  = [employer]/ pushing cart  Cough: no Sleep: R side down / horizontal but 2 pillows  SABA use:  None rec Wt loss ono RA 02/08/18  25 min at < 89% :  Declined 02    - Repeat PFTs   02/08/2018    FEV1 1.30 (61 % ) ratio 71  p 20 % improvement from saba p ? prior to study with DLCO  73 % corrects to 112  % for alv volume  And min curvature p saba  rec trial of symb 80 2bid and f/u in 6  weeks    03/14/2018  f/u ov/Wert re:  GOLD 0 copd/ ab on symb  Chief Complaint  Patient presents with  . Follow-up    to discuss [facility]NO results from [facility]. Patient states she has been feeling well since last visit.   Dyspnea:  Doing [employer] a little faster  Cough: some am congestion nasal  Sleep: 2 pillows/ horizontal s am HA SABA use:  Once a week at most  rec Continue  symbicort 80 2bid  Work on inhaler technique:       06/17/2018  f/u ov/Wert re: GOLD 0 /ab on symbicort 80 2bid  Chief Complaint  Patient presents with  . Follow-up    Breathing is doing well. She uses her albuterol inhaler once per wk on average. She states she woke up yesterday morning and had lost partial vision in her right eye for approx 1 hour.  Dyspnea:  No change doe at [employer]  Cough: none Sleeping:  Fine flat   SABA use: rare 02: none  rec Work on inhaler technique F/u prn    Virtual Visit via Telephone Note 02/18/2019 re GOLD 0/ab on symb 80 2bid prn  I connected with Seward Meth on 02/18/19 at 1:36 PM EDT by telephone and verified that I am speaking with the correct person using two identifiers.   I discussed the limitations, risks, security and privacy concerns of performing an evaluation and management service by telephone and the availability of in person appointments. I also discussed with the patient that there may be a patient responsible charge related to this service. The patient expressed understanding and agreed to proceed.   History of Present Illness: Dyspnea:  MMRC1 = can walk nl pace, flat grade, can't hurry or go uphills or steps s sob Shopping ok 'every day" , advised better to walk outside more and shop just once a week to reduce covid exp.   Cough: none Sleeping: fine flat on R side 2 pillows SABA use: none, using symb 80  2 bid prn  02: none   No obvious patterns  day to day or daytime variability or assoc excess/ purulent sputum or mucus plugs or hemoptysis or  cp or chest tightness, subjective wheeze or overt sinus or hb symptoms.    Also denies any obvious fluctuation of symptoms with weather or environmental changes or other aggravating or alleviating factors except as outlined above.   Meds reviewed/ med reconciliation completed          Observations/Objective: Sounds great on phone, good phonation, full sentences, positive outlook  Assessment and Plan: See problem list for active a/p's   Follow Up Instructions: See avs for instructions unique to this ov which includes revised/ updated med list     I discussed the assessment and treatment plan with the patient. The patient was provided an opportunity to ask questions and all were answered. The patient agreed with the plan and demonstrated an understanding of the instructions.   The patient was advised to call back or seek an in-person evaluation if the symptoms worsen or if the condition fails to improve as anticipated.  I provided 24 minutes of non-face-to-face time during this encounter.   Christinia Gully, MD

## 2019-02-18 NOTE — Assessment & Plan Note (Signed)
Quit smoking 2014  See pfts 02/03/18 did not meet criteria for copd - LDCT 11/27/17 with emphysematous changes - Repeat PFTs   02/08/2018    FEV1 1.30 (61 % ) ratio 71  p 20 % improvement from saba p ? prior to study with DLCO  73 % corrects to 112  % for alv volume  And min curvature p saba  rec trial of symb 80 2bid and f/u in 6 weeks - 06/17/2018  After extensive coaching inhaler device,  effectiveness =    75%  >>  Continue symbicort 80 2bid    - 02/18/2019 pt using sym 80 2bid prn x 52m and doing great> continue Based on two studies from [place]  378; 20 p 1865 (2018) and 380 : p2020-30 (2019) in pts with mild asthma it is reasonable to use low dose symbicort eg 80 2bid "prn" flare in this setting but I emphasized this was only shown with symbicort and takes advantage of the rapid onset of action but is not the same as "rescue therapy" but can be stopped once the acute symptoms have resolved and the need for rescue has been minimized (< 2 x weekly)    Explained:   I reviewed the Fletcher curve with the patient that basically indicates  if you quit smoking when your best day FEV1 is still well preserved (as was clearly  the case here)  it is highly unlikely you will progress to severe disease and informed the patient there was  no medication on the market that has proven to alter the curve/ its downward trajectory  or the likelihood of progression of their disease(unlike other chronic medical conditions such as atheroclerosis where we do think we can change the natural hx with risk reducing meds)    Therefore stopping smoking and maintaining abstinence are  the most important aspects of care, not choice of inhalers or for that matter, doctors.   Treatment other than smoking cessation  is entirely directed by severity of symptoms and focused also on reducing exacerbations, not attempting to change the natural history of the disease.   Since can control symptoms with prn sym 80 and no tendency to aecopd rec No  change in rx/ pulmonary f/u prn    Each maintenance medication was reviewed in detail including most importantly the difference between maintenance and as needed and under what circumstances the prns are to be used.  Please see AVS for specific  Instructions which are unique to this visit and I personally typed out  which were reviewed in detail over the phone  with the patient and a copy provided.

## 2019-02-18 NOTE — Patient Instructions (Signed)
Continue symbicort 80 up to 2 puffs every 12 hours until 100% better for a week, then ok to taper down/ off     If you are satisfied with your treatment plan,  let your doctor know and he/she can either refill your medications or you can return here when your prescription runs out.     If in any way you are not 100% satisfied,  please tell us.  If 100% better, tell your friends!  Pulmonary follow up is as needed

## 2019-02-26 ENCOUNTER — Other Ambulatory Visit: Payer: Self-pay | Admitting: Internal Medicine

## 2019-03-03 ENCOUNTER — Other Ambulatory Visit: Payer: Self-pay | Admitting: Hematology and Oncology

## 2019-04-03 ENCOUNTER — Telehealth: Payer: Self-pay | Admitting: Family Medicine

## 2019-04-03 NOTE — Telephone Encounter (Signed)
Pt's called and is requesting covid testing. Pt is not having any symptoms but was in contact with someone who was having covid type symptoms and is now awaiting test results.

## 2019-04-03 NOTE — Telephone Encounter (Signed)
I would prefer virtual appt before setting up the apptointment.Melanie Schneider if pt unable or refuses please get more info on contact and document negative symptoms.  She is a higher risk patient.

## 2019-04-03 NOTE — Telephone Encounter (Signed)
Will you please see if patient will schedule virtual visit to discuss Covid Exposure concerns.

## 2019-04-04 ENCOUNTER — Encounter: Payer: Self-pay | Admitting: Family Medicine

## 2019-04-04 ENCOUNTER — Ambulatory Visit (INDEPENDENT_AMBULATORY_CARE_PROVIDER_SITE_OTHER): Payer: Medicare HMO | Admitting: Family Medicine

## 2019-04-04 ENCOUNTER — Telehealth: Payer: Self-pay | Admitting: *Deleted

## 2019-04-04 VITALS — BP 110/68 | HR 100 | Temp 97.6°F | Ht 63.75 in

## 2019-04-04 DIAGNOSIS — Z20828 Contact with and (suspected) exposure to other viral communicable diseases: Secondary | ICD-10-CM

## 2019-04-04 DIAGNOSIS — Z20822 Contact with and (suspected) exposure to covid-19: Secondary | ICD-10-CM

## 2019-04-04 DIAGNOSIS — I700 Atherosclerosis of aorta: Secondary | ICD-10-CM | POA: Diagnosis not present

## 2019-04-04 DIAGNOSIS — I2510 Atherosclerotic heart disease of native coronary artery without angina pectoris: Secondary | ICD-10-CM

## 2019-04-04 NOTE — Telephone Encounter (Signed)
-----   Message from Jinny Sanders, MD sent at 04/04/2019 12:25 PM EDT ----- Seward Meth 04/25/1946 [id number] Exposure to family now symptomatic and covid test pending HIgh risk for complicaitons. [employer] [id number]      Butch Penny: Complete and send PUI form. Put pt on respiratory call back list.

## 2019-04-04 NOTE — Telephone Encounter (Signed)
Left message asking pt to call office  °

## 2019-04-04 NOTE — Telephone Encounter (Signed)
LM to return call to schedule covid testing @ [phone] M-F 7a-7p. Order placed

## 2019-04-04 NOTE — Progress Notes (Signed)
VIRTUAL VISIT Due to national recommendations of social distancing due to [place] 19, a virtual visit is felt to be most appropriate for this patient at this time.   I connected with the patient on 04/04/19 at 11:00 AM EDT by virtual telehealth platform and verified that I am speaking with the correct person using two identifiers.   I discussed the limitations, risks, security and privacy concerns of performing an evaluation and management service by  virtual telehealth platform and the availability of in person appointments. I also discussed with the patient that there may be a patient responsible charge related to this service. The patient expressed understanding and agreed to proceed.  Patient location: Home Provider Location: [facility] Participants: Eliezer Lofts and [place]   Chief Complaint  Patient presents with  . Covid Concerns    History of Present Illness: 73 year old female presents for covid exposure concerns.   She reports she and her husband got notice yesterday about exposure to possible covid pts. Family had Fourth of July picnic... 3 families with sick individuals now fever... no positive test, pending covid test.  IN < 6 foot contact with one of sick individuals... was not always wearing mask (ate)   She denies any symptoms. No fever, ST, slightly runny nose and chronic cough, no abd pain, no diarrhea. She is risk level 8... hx of COPD  COVID 19 screen No recent travel or known exposure to [place] The patient denies respiratory symptoms of COVID 19 at this time.  The importance of social distancing was discussed today.   Review of Systems  Constitutional: Negative for chills and fever.  HENT: Negative for congestion and ear pain.   Eyes: Negative for pain and redness.  Respiratory: Negative for cough and shortness of breath.   Cardiovascular: Negative for chest pain, palpitations and leg swelling.  Gastrointestinal: Negative for abdominal  pain, blood in stool, constipation, diarrhea, nausea and vomiting.  Genitourinary: Negative for dysuria.  Musculoskeletal: Negative for falls and myalgias.  Skin: Negative for rash.  Neurological: Negative for dizziness.  Psychiatric/Behavioral: Negative for depression. The patient is not nervous/anxious.       Past Medical History:  Diagnosis Date  . Breast cancer, left ([place]) 06/2017  . COPD (chronic obstructive pulmonary disease) (HCC)    states coughs frequently; occasional SOB with ADLs; no home O2  . 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 Dysthymia   . Family history of adverse reaction to anesthesia    states father had reaction to anesthesia and had to be resuscitated  . History of CVA (cerebrovascular accident) 2014  . Hyperlipidemia   . Morbid obesity due to excess calories ([place]) complicated by hyperlipidemia 12/13/2006   Qualifier: Diagnosis of  By: Diona Browner MD, Uvaldo Rybacki    . Peripheral neuropathy    lower legs - walks with a straight cane    reports that she quit smoking about 5 years ago. Her smoking use included cigarettes. She has a 25.50 pack-year smoking history. She has never used smokeless tobacco. She reports that she does not drink alcohol or use drugs.   Current Outpatient Medications:  .  albuterol (PROVENTIL HFA;VENTOLIN HFA) 108 (90 Base) MCG/ACT inhaler, Inhale 2 puffs into the lungs every 6 (six) hours as needed for wheezing or shortness of breath (with aerochamber)., Disp: 1 Inhaler, Rfl: 3 .  ALPRAZolam (XANAX) 0.25 MG tablet, TAKE 1 TABLET BY MOUTH ONCE DAILY AS NEEDED FOR ANXIETY, Disp: 30 tablet,  Rfl: 0 .  atorvastatin (LIPITOR) 40 MG tablet, TAKE ONE TABLET BY MOUTH ONCE DAILY (Patient taking differently: Take 40 mg by mouth once a week. ), Disp: 90 tablet, Rfl: 3 .  letrozole (FEMARA) 2.5 MG tablet, Take 1 tablet by mouth once daily, Disp: 90 tablet, Rfl: 1 .  SYMBICORT 80-4.5 MCG/ACT inhaler, TAKE 2 PUFFS BY MOUTH TWICE A DAY (Patient  taking differently: Inhale 2 puffs into the lungs 2 (two) times daily as needed. ), Disp: 10.2 Inhaler, Rfl: 11 No current facility-administered medications for this visit.   Facility-Administered Medications Ordered in Other Visits:  .  SONAFINE emulsion 1 application, 1 application, Topical, BID, Hayden Pedro, PA-C, 1 application at 84/03/75 1521   Observations/Objective: Blood pressure 110/68, pulse 100, temperature 97.6 F (36.4 C), temperature source Oral, height 5' 3.75" (1.619 m).  Physical Exam  Physical Exam Constitutional:      General: The patient is not in acute distress. Pulmonary:     Effort: Pulmonary effort is normal. No respiratory distress.  Neurological:     Mental Status: The patient is alert and oriented to person, place, and time.  Psychiatric:        Mood and Affect: Mood normal.        Behavior: Behavior normal.   Assessment and Plan Exposure to Covid-19 Virus Reviewed high risk, discussed home care if symptoms develop, discussed testing and home isolation.     I discussed the assessment and treatment plan with the patient. The patient was provided an opportunity to ask questions and all were answered. The patient agreed with the plan and demonstrated an understanding of the instructions.   The patient was advised to call back or seek an in-person evaluation if the symptoms worsen or if the condition fails to improve as anticipated.     Eliezer Lofts, MD

## 2019-04-04 NOTE — Telephone Encounter (Signed)
Patient returned call and scheduled appointment for today at 11:00. Patient doesn't have video capability, so it'll be a phone call.

## 2019-04-07 NOTE — Telephone Encounter (Signed)
Pt called back, she said she has not received a call to get tested. I have sent a msg to [place] at [facility]. Pt said her spouse received a call and has already had testing.   Additionally, 3 people in her spouse's family have tested positive for covid and they are all in the hospital.

## 2019-04-07 NOTE — Telephone Encounter (Signed)
Patient scheduled at the [facility] testing site for 04/08/2019 at 11am

## 2019-04-08 ENCOUNTER — Other Ambulatory Visit: Payer: Medicare HMO

## 2019-04-08 DIAGNOSIS — R6889 Other general symptoms and signs: Secondary | ICD-10-CM | POA: Diagnosis not present

## 2019-04-08 DIAGNOSIS — Z20822 Contact with and (suspected) exposure to covid-19: Secondary | ICD-10-CM

## 2019-04-08 NOTE — Telephone Encounter (Signed)
Pt scheduled for testing today

## 2019-04-11 ENCOUNTER — Telehealth: Payer: Self-pay | Admitting: *Deleted

## 2019-04-11 NOTE — Telephone Encounter (Signed)
Noted  

## 2019-04-11 NOTE — Telephone Encounter (Signed)
Called to check on patient because she was to go and have covid test done. Spoke to patient and was advised that she went Tuesday for her test and has not gotten the results back yet. Patient stated that her husband went Monday and got his results yesterday and they were negative. Patient stated that she is feeling fine just a little anxious to get her results. Advised patient she should hear something about her results as soon as they are available.

## 2019-04-12 LAB — NOVEL CORONAVIRUS, NAA: SARS-CoV-2, NAA: NOT DETECTED

## 2019-05-11 DIAGNOSIS — Z20828 Contact with and (suspected) exposure to other viral communicable diseases: Secondary | ICD-10-CM | POA: Insufficient documentation

## 2019-05-11 DIAGNOSIS — Z20822 Contact with and (suspected) exposure to covid-19: Secondary | ICD-10-CM | POA: Insufficient documentation

## 2019-05-11 NOTE — Assessment & Plan Note (Signed)
Will re-eval risk factors at wellness

## 2019-05-11 NOTE — Assessment & Plan Note (Signed)
Reviewed high risk, discussed home care if symptoms develop, discussed testing and home isolation.

## 2019-05-15 ENCOUNTER — Other Ambulatory Visit: Payer: Self-pay | Admitting: Family Medicine

## 2019-05-15 NOTE — Telephone Encounter (Signed)
LOV 04/04/2019 for acute. Next appointment on 10/28/2019. Last filled on 12/23/2018 for #30 with 0 refills.

## 2019-05-28 ENCOUNTER — Ambulatory Visit (INDEPENDENT_AMBULATORY_CARE_PROVIDER_SITE_OTHER): Payer: Medicare HMO

## 2019-05-28 DIAGNOSIS — Z23 Encounter for immunization: Secondary | ICD-10-CM

## 2019-06-17 ENCOUNTER — Other Ambulatory Visit: Payer: Self-pay

## 2019-06-17 DIAGNOSIS — Z20822 Contact with and (suspected) exposure to covid-19: Secondary | ICD-10-CM

## 2019-06-17 DIAGNOSIS — R6889 Other general symptoms and signs: Secondary | ICD-10-CM | POA: Diagnosis not present

## 2019-06-18 LAB — NOVEL CORONAVIRUS, NAA: SARS-CoV-2, NAA: NOT DETECTED

## 2019-06-26 DIAGNOSIS — Z853 Personal history of malignant neoplasm of breast: Secondary | ICD-10-CM | POA: Diagnosis not present

## 2019-06-26 LAB — HM MAMMOGRAPHY

## 2019-06-27 ENCOUNTER — Encounter: Payer: Self-pay | Admitting: Family Medicine

## 2019-07-02 NOTE — Progress Notes (Signed)
Patient Care Team: Jinny Sanders, MD as PCP - General Erroll Luna, MD as Consulting Physician (General Surgery) Nicholas Lose, MD as Consulting Physician (Hematology and Oncology) Kyung Rudd, MD as Consulting Physician (Radiation Oncology) Delice Bison Charlestine Massed, NP as Nurse Practitioner (Hematology and Oncology)  DIAGNOSIS:    ICD-10-CM   1. Ductal carcinoma in situ (DCIS) of right breast  D05.11     SUMMARY OF ONCOLOGIC HISTORY: Oncology History  Ductal carcinoma in situ (DCIS) of right breast  06/28/2017 Initial Diagnosis   Ductal carcinoma in situ (DCIS) of right breast   07/24/2017 Surgery   Right lumpectomy: DCIS intermediate grade 0.6 cm, Tis NX stage 0   08/29/2017 - 10/01/2017 Radiation Therapy   Adjuvant radiation therapy   History of breast cancer  06/26/2017 Initial Diagnosis   Screening detected right breast calcifications and architectural distortion on the left breast; right breast calcs 1.1 cm normal biopsy DCIS ER 90%, PR 95%, grade 1-2; Tis Nx stage 0 left breast distortion 1.8 cm mass by ultrasound biopsy: IDC with DCIS ER 95%, PR 5%, Ki-67 15%, HER-2 negative ratio 1.39, grade 1-2, T1c N0 stage IA clinical stage   07/24/2017 Surgery   Left lumpectomy: IDC grade 2, 1.5 cm, intermediate grade DCIS, anterior margin broadly positive, posterior margin focally positive, lymphovascular invasion present, 0/2 lymph nodes negative T1CN0 stage I a   08/29/2017 - 10/01/2017 Radiation Therapy   Adjuvant radiation therapy   10/2017 -  Anti-estrogen oral therapy   Letrozole daily     CHIEF COMPLIANT: Follow-up of left breast cancer on letrozole  INTERVAL HISTORY: Melanie Schneider is a 73 y.o. with above-mentioned history of left breast cancer treated with lumpectomy, radiation, and who is currently on oral antiestrogen therapy with letrozole. I last saw her a year ago. She presents to the clinic today for annual follow-up.  She has intermittent pain in the  breast but otherwise doing well.  She has no side effects to letrozole therapy.  REVIEW OF SYSTEMS:   Constitutional: Denies fevers, chills or abnormal weight loss Eyes: Denies blurriness of vision Ears, nose, mouth, throat, and face: Denies mucositis or sore throat Respiratory: Denies cough, dyspnea or wheezes Cardiovascular: Denies palpitation, chest discomfort Gastrointestinal: Denies nausea, heartburn or change in bowel habits Skin: Denies abnormal skin rashes Lymphatics: Denies new lymphadenopathy or easy bruising Neurological: Denies numbness, tingling or new weaknesses Behavioral/Psych: Mood is stable, no new changes  Extremities: No lower extremity edema Breast: denies any pain or lumps or nodules in either breasts All other systems were reviewed with the patient and are negative.  I have reviewed the past medical history, past surgical history, social history and family history with the patient and they are unchanged from previous note.  ALLERGIES:  is allergic to iodine; other; povidone-iodine; adhesive [tape]; and latex.  MEDICATIONS:  Current Outpatient Medications  Medication Sig Dispense Refill  . albuterol (PROVENTIL HFA;VENTOLIN HFA) 108 (90 Base) MCG/ACT inhaler Inhale 2 puffs into the lungs every 6 (six) hours as needed for wheezing or shortness of breath (with aerochamber). 1 Inhaler 3  . ALPRAZolam (XANAX) 0.25 MG tablet TAKE 1 TABLET BY MOUTH ONCE DAILY AS NEEDED FOR ANXIETY 30 tablet 0  . atorvastatin (LIPITOR) 40 MG tablet TAKE ONE TABLET BY MOUTH ONCE DAILY (Patient taking differently: Take 40 mg by mouth once a week. ) 90 tablet 3  . letrozole (FEMARA) 2.5 MG tablet Take 1 tablet by mouth once daily 90 tablet 1  . SYMBICORT 80-4.5  MCG/ACT inhaler TAKE 2 PUFFS BY MOUTH TWICE A DAY (Patient taking differently: Inhale 2 puffs into the lungs 2 (two) times daily as needed. ) 10.2 Inhaler 11   No current facility-administered medications for this visit.     Facility-Administered Medications Ordered in Other Visits  Medication Dose Route Frequency Provider Last Rate Last Dose  . SONAFINE emulsion 1 application  1 application Topical BID Hayden Pedro, [place]   1 application at 79/39/03 1521    PHYSICAL EXAMINATION: ECOG PERFORMANCE STATUS: 1 - Symptomatic but completely ambulatory  Vitals:   07/03/19 1208  BP: 127/68  Pulse: 63  Resp: 17  Temp: 98.9 F (37.2 C)  SpO2: 97%   Filed Weights   07/03/19 1208  Weight: 177 lb (80.3 kg)    GENERAL: alert, no distress and comfortable SKIN: skin color, texture, turgor are normal, no rashes or significant lesions EYES: normal, Conjunctiva are pink and non-injected, sclera clear OROPHARYNX: no exudate, no erythema and lips, buccal mucosa, and tongue normal  NECK: supple, thyroid normal size, non-tender, without nodularity LYMPH: no palpable lymphadenopathy in the cervical, axillary or inguinal LUNGS: clear to auscultation and percussion with normal breathing effort HEART: regular rate & rhythm and no murmurs and no lower extremity edema ABDOMEN: abdomen soft, non-tender and normal bowel sounds MUSCULOSKELETAL: no cyanosis of digits and no clubbing  NEURO: alert & oriented x 3 with fluent speech, no focal motor/sensory deficits EXTREMITIES: No lower extremity edema BREAST: No palpable masses or nodules in either right or left breasts.  There is tenderness in bilateral breasts.  No palpable axillary supraclavicular or infraclavicular adenopathy no breast tenderness or nipple discharge. (exam performed in the presence of a chaperone)  LABORATORY DATA:  I have reviewed the data as listed CMP Latest Ref Rng & Units 10/17/2018 10/12/2017 07/04/2017  Glucose 70 - 99 mg/dL 108(H) 99 109  BUN 6 - 23 mg/dL 17 14 12.1  Creatinine 0.40 - 1.20 mg/dL 0.67 0.56 0.7  Sodium 135 - 145 mEq/L 139 140 142  Potassium 3.5 - 5.1 mEq/L 4.6 4.3 3.7  Chloride 96 - 112 mEq/L 105 105 -  CO2 19 - 32 mEq/L '29  30 27  '$ Calcium 8.4 - 10.5 mg/dL 9.1 8.8 8.7  Total Protein 6.0 - 8.3 g/dL 6.9 6.5 6.7  Total Bilirubin 0.2 - 1.2 mg/dL 0.5 0.7 0.59  Alkaline Phos 39 - 117 U/L 54 64 79  AST 0 - 37 U/L '13 15 9  '$ ALT 0 - 35 U/L '10 12 9    '$ Lab Results  Component Value Date   WBC 7.6 07/04/2017   HGB 12.6 07/04/2017   HCT 38.9 07/04/2017   MCV 85.2 07/04/2017   PLT 267 07/04/2017   NEUTROABS 5.0 07/04/2017    ASSESSMENT & PLAN:  Ductal carcinoma in situ (DCIS) of right breast 07/24/2017 left lumpectomy: IDC grade 2, 1.5 cm, intermediate grade DCIS, anterior margin broadly positive, posterior margin focally positive, lymphovascular invasion present, 0/2 lymph nodes negative T1CN0 stage I a  Right lumpectomy: DCIS grade 2 Tis NX stage 0 adjuvant radiation therapy 08/29/17- 10/01/17  Recommendations: Adjuvant antiestrogen therapy with Letrozole 2.5 mg daily started October 26, 2017  Letrozole toxicities: Denies any hot flashes or myalgias.  Breast cancer surveillance: 1.  Breast exam 07/03/2019: benign 2. Mammogram 06/26/2019  RTC in 1 year for follow-up    No orders of the defined types were placed in this encounter.  The patient has a good understanding of the overall plan.  she agrees with it. she will call with any problems that may develop before the next visit here.  Nicholas Lose, MD 07/03/2019  Julious Oka Dorshimer am acting as scribe for Dr. Nicholas Lose.  I have reviewed the above documentation for accuracy and completeness, and I agree with the above.

## 2019-07-03 ENCOUNTER — Inpatient Hospital Stay: Payer: Medicare HMO | Attending: Hematology and Oncology | Admitting: Hematology and Oncology

## 2019-07-03 ENCOUNTER — Other Ambulatory Visit: Payer: Self-pay

## 2019-07-03 DIAGNOSIS — D0512 Intraductal carcinoma in situ of left breast: Secondary | ICD-10-CM | POA: Insufficient documentation

## 2019-07-03 DIAGNOSIS — D0511 Intraductal carcinoma in situ of right breast: Secondary | ICD-10-CM | POA: Insufficient documentation

## 2019-07-03 DIAGNOSIS — Z79811 Long term (current) use of aromatase inhibitors: Secondary | ICD-10-CM | POA: Insufficient documentation

## 2019-07-03 DIAGNOSIS — C50212 Malignant neoplasm of upper-inner quadrant of left female breast: Secondary | ICD-10-CM | POA: Insufficient documentation

## 2019-07-03 DIAGNOSIS — Z170 Estrogen receptor positive status [ER+]: Secondary | ICD-10-CM | POA: Diagnosis not present

## 2019-07-03 MED ORDER — LETROZOLE 2.5 MG PO TABS: 2.5000 mg | ORAL_TABLET | Freq: Every day | ORAL | 3 refills | Status: DC

## 2019-07-03 NOTE — Assessment & Plan Note (Signed)
07/24/2017 left lumpectomy: IDC grade 2, 1.5 cm, intermediate grade DCIS, anterior margin broadly positive, posterior margin focally positive, lymphovascular invasion present, 0/2 lymph nodes negative T1CN0 stage I a  Right lumpectomy: DCIS grade 2 Tis NX stage 0 adjuvant radiation therapy 08/29/17- 10/01/17  Recommendations: Adjuvant antiestrogen therapy with Letrozole 2.5 mg daily started October 26, 2017  Letrozole toxicities: Denies any hot flashes or myalgias.  Breast cancer surveillance: 1.  Breast exam 07/03/2019: benign 2. Mammogram 06/26/2019  RTC in 1 year for follow-up

## 2019-07-04 ENCOUNTER — Telehealth: Payer: Self-pay | Admitting: Hematology and Oncology

## 2019-07-04 NOTE — Telephone Encounter (Signed)
I talk with patient regarding schedule  

## 2019-08-11 ENCOUNTER — Ambulatory Visit: Payer: Self-pay | Admitting: *Deleted

## 2019-08-11 NOTE — Telephone Encounter (Signed)
Pt called in c/o tingling from her left armpit to her fingers that has been going on for 3 weeks.   It's worse when she turns her head to the left.    She had a mastectomy on the left side in 2018.   She can use and move the arm with difficulties.  I attempted 4 times to contact [name] to get her an appt.   Protocol is to be seen within 24 hours.   I let pt know I would send a high priority note to the office and have them call her back to make an appt.  Protocol is to be seen within 24 hours.  She was agreeable to this plan.  I sent this to the office high priority since I could not get a hold of anyone.  Reason for Disposition . Numbness (i.e., loss of sensation) in hand or fingers    Having tingling from left armpit down to fingers.   Can move normally.   Had left mastectomy in 2018.  Answer Assessment - Initial Assessment Questions 1. ONSET: "When did the pain start?"     I'm having a tingling from armpit to my fingers. It's going  On for 3 weeks now.   If I turn left it's worse my head.    I've had TIA's and breast cancer surgery 2. LOCATION: "Where is the pain located?"     The tingling is worse if I lean on my left arm.   I can use my arm and hand normally. 3. PAIN: "How bad is the pain?" (Scale 1-10; or mild, moderate, severe)   - MILD (1-3): doesn't interfere with normal activities   - MODERATE (4-7): interferes with normal activities (e.g., work or school) or awakens from sleep   - SEVERE (8-10): excruciating pain, unable to do any normal activities, unable to hold a cup of water     A place under my arm by the armpit with tingling going down to my fingers. 4. WORK OR EXERCISE: "Has there been any recent work or exercise that involved this part of the body?"     No.   Breast cancer surgery on my left side.   It was 2018 when I had the surgery. They removed a limp node. 5. CAUSE: "What do you think is causing the arm pain?"     No 6. OTHER SYMPTOMS: "Do you  have any other symptoms?" (e.g., neck pain, swelling, rash, fever, numbness, weakness)     No numbness.  No neck kpain. 7. PREGNANCY: "Is there any chance you are pregnant?" "When was your last menstrual period?"     N/A due to age  Protocols used: ARM PAIN-A-AH

## 2019-08-12 ENCOUNTER — Other Ambulatory Visit: Payer: Self-pay

## 2019-08-12 ENCOUNTER — Ambulatory Visit (INDEPENDENT_AMBULATORY_CARE_PROVIDER_SITE_OTHER): Payer: Medicare HMO | Admitting: Family Medicine

## 2019-08-12 VITALS — BP 140/70 | HR 91 | Temp 98.4°F | Ht 63.75 in | Wt 178.8 lb

## 2019-08-12 DIAGNOSIS — M5412 Radiculopathy, cervical region: Secondary | ICD-10-CM | POA: Diagnosis not present

## 2019-08-12 MED ORDER — PREDNISONE 20 MG PO TABS: ORAL_TABLET | ORAL | 0 refills | Status: DC

## 2019-08-12 MED ORDER — CLOBETASOL PROP EMOLLIENT BASE 0.05 % EX CREA: TOPICAL_CREAM | CUTANEOUS | 0 refills | Status: DC

## 2019-08-12 NOTE — Progress Notes (Signed)
Chief Complaint  Patient presents with  . Tingling    left arm x's 2-3 weeks     History of Present Illness: HPI   73 year old female  with OA presents with tingling in left arm x 2-3 weeks. If she turns her head to the left.. she feels tingling from armpit to finger.. mainly ulnar side of hand. Pain in left arm when numb.. not severe.  Mild pain in neck.  No weakness in left hand.   No falls,  No change in activity. She is not as active.  COVID 19 screen No recent travel or known exposure to COVID19 The patient denies respiratory symptoms of COVID 19 at this time.  The importance of social distancing was discussed today.   Review of Systems  Constitutional: Negative for chills and fever.  HENT: Negative for congestion and ear pain.   Eyes: Negative for pain and redness.  Respiratory: Negative for cough and shortness of breath.   Cardiovascular: Negative for chest pain, palpitations and leg swelling.  Gastrointestinal: Negative for abdominal pain, blood in stool, constipation, diarrhea, nausea and vomiting.  Genitourinary: Negative for dysuria.  Musculoskeletal: Negative for falls and myalgias.  Skin: Negative for rash.  Neurological: Positive for tingling. Negative for dizziness.  Psychiatric/Behavioral: Negative for depression. The patient is not nervous/anxious.       Past Medical History:  Diagnosis Date  . Breast cancer, left ([facility]) 06/2017  . COPD (chronic obstructive pulmonary disease) ([facility])    states coughs frequently; occasional SOB with ADLs; no home O2  . 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 Dysthymia   . Family history of adverse reaction to anesthesia    states father had reaction to anesthesia and had to be resuscitated  . History of CVA (cerebrovascular accident) 2014  . Hyperlipidemia   . Morbid obesity due to excess calories ([facility]) complicated by hyperlipidemia 12/13/2006   Qualifier: Diagnosis of  By: Diona Browner MD, Amy    .  Peripheral neuropathy    lower legs - walks with a straight cane    reports that she quit smoking about 5 years ago. Her smoking use included cigarettes. She has a 25.50 pack-year smoking history. She has never used smokeless tobacco. She reports that she does not drink alcohol or use drugs.   Current Outpatient Medications:  .  albuterol (PROVENTIL HFA;VENTOLIN HFA) 108 (90 Base) MCG/ACT inhaler, Inhale 2 puffs into the lungs every 6 (six) hours as needed for wheezing or shortness of breath (with aerochamber)., Disp: 1 Inhaler, Rfl: 3 .  ALPRAZolam (XANAX) 0.25 MG tablet, TAKE 1 TABLET BY MOUTH ONCE DAILY AS NEEDED FOR ANXIETY, Disp: 30 tablet, Rfl: 0 .  letrozole (FEMARA) 2.5 MG tablet, Take 1 tablet (2.5 mg total) by mouth daily., Disp: 90 tablet, Rfl: 3 .  atorvastatin (LIPITOR) 40 MG tablet, TAKE ONE TABLET BY MOUTH ONCE DAILY (Patient not taking: No sig reported), Disp: 90 tablet, Rfl: 3 No current facility-administered medications for this visit.   Facility-Administered Medications Ordered in Other Visits:  .  SONAFINE emulsion 1 application, 1 application, Topical, BID, Hayden Pedro, PA-C, 1 application at [id number]   Observations/Objective: Blood pressure 140/70, pulse 91, temperature 98.4 F (36.9 C), temperature source Temporal, height 5' 3.75" (1.619 m), weight 178 lb 12 oz (81.1 kg), SpO2 95 %.  Physical Exam Constitutional:      General: She is not in acute distress.    Appearance: Normal appearance. She is well-developed.  She is not ill-appearing or toxic-appearing.  HENT:     Head: Normocephalic.     Right Ear: Hearing, tympanic membrane, ear canal and external ear normal. Tympanic membrane is not erythematous, retracted or bulging.     Left Ear: Hearing, tympanic membrane, ear canal and external ear normal. Tympanic membrane is not erythematous, retracted or bulging.     Nose: No mucosal edema or rhinorrhea.     Right Sinus: No maxillary sinus tenderness or  frontal sinus tenderness.     Left Sinus: No maxillary sinus tenderness or frontal sinus tenderness.     Mouth/Throat:     Pharynx: Uvula midline.  Eyes:     General: Lids are normal. Lids are everted, no foreign bodies appreciated.     Conjunctiva/sclera: Conjunctivae normal.     Pupils: Pupils are equal, round, and reactive to light.  Neck:     Musculoskeletal: Normal range of motion and neck supple.     Thyroid: No thyroid mass or thyromegaly.     Vascular: No carotid bruit.     Trachea: Trachea normal.  Cardiovascular:     Rate and Rhythm: Normal rate and regular rhythm.     Pulses: Normal pulses.     Heart sounds: Normal heart sounds, S1 normal and S2 normal. No murmur. No friction rub. No gallop.   Pulmonary:     Effort: Pulmonary effort is normal. No tachypnea or respiratory distress.     Breath sounds: Normal breath sounds. No decreased breath sounds, wheezing, rhonchi or rales.  Abdominal:     General: Bowel sounds are normal.     Palpations: Abdomen is soft.     Tenderness: There is no abdominal tenderness.  Musculoskeletal:     Cervical back: She exhibits decreased range of motion and tenderness. She exhibits no bony tenderness.  Skin:    General: Skin is warm and dry.     Findings: No rash.  Neurological:     Mental Status: She is alert.     Cranial Nerves: Cranial nerves are intact.     Sensory: Sensory deficit present.     Motor: Motor function is intact. No weakness or tremor.     Coordination: Coordination is intact.  Psychiatric:        Mood and Affect: Mood is not anxious or depressed.        Speech: Speech normal.        Behavior: Behavior normal. Behavior is cooperative.        Thought Content: Thought content normal.        Judgment: Judgment normal.      Assessment and Plan   Cervical radiculopathy Complete pred taper. Limit motion of neck. Heat on neck or arm. Follow up in 2 weeks if not improving for X-ray.      Eliezer Lofts, MD

## 2019-08-12 NOTE — Patient Instructions (Signed)
Complete pred taper. Limit motion of neck. Heat on neck or arm. Follow up in 2 weeks if not improving for X-ray.  Cervical Radiculopathy  Cervical radiculopathy means that a nerve in the neck (a cervical nerve) is pinched or bruised. This can happen because of an injury to the cervical spine (vertebrae) in the neck, or as a normal part of getting older. This can cause pain or loss of feeling (numbness) that runs from your neck all the way down to your arm and fingers. Often, this condition gets better with rest. Treatment may be needed if the condition does not get better. What are the causes?  A neck injury.  A bulging disk in your spine.  Muscle movements that you cannot control (muscle spasms).  Tight muscles in your neck due to overuse.  Arthritis.  Breakdown in the bones and joints of the spine (spondylosis) due to getting older.  Bone spurs that form near the nerves in the neck. What are the signs or symptoms?  Pain. The pain may: ? Run from the neck to the arm and hand. ? Be very bad or irritating. ? Be worse when you move your neck.  Loss of feeling or tingling in your arm or hand.  Weakness in your arm or hand, in very bad cases. How is this treated? In many cases, treatment is not needed for this condition. With rest, the condition often gets better over time. If treatment is needed, options may include:  Wearing a soft neck collar (cervical collar) for short periods of time, as told by your doctor.  Doing exercises (physical therapy) to strengthen your neck muscles.  Taking medicines.  Having shots (injections) in your spine, in very bad cases.  Having surgery. This may be needed if other treatments do not help. The type of surgery that is used depends on the cause of your condition. Follow these instructions at home: If you have a soft neck collar:  Wear it as told by your doctor. Remove it only as told by your doctor.  Ask your doctor if you can remove  the collar for cleaning and bathing. If you are allowed to remove the collar for cleaning or bathing: ? Follow instructions from your doctor about how to remove the collar safely. ? Clean the collar by wiping it with mild soap and water and drying it completely. ? Take out any removable pads in the collar every 1-2 days. Wash them by hand with soap and water. Let them air-dry completely before you put them back in the collar. ? Check your skin under the collar for redness or sores. If you see any, tell your doctor. Managing pain      Take over-the-counter and prescription medicines only as told by your doctor.  If told, put ice on the painful area. ? If you have a soft neck collar, remove it as told by your doctor. ? Put ice in a plastic bag. ? Place a towel between your skin and the bag. ? Leave the ice on for 20 minutes, 2-3 times a day.  If using ice does not help, you can try using heat. Use the heat source that your doctor recommends, such as a moist heat pack or a heating pad. ? Place a towel between your skin and the heat source. ? Leave the heat on for 20-30 minutes. ? Remove the heat if your skin turns bright red. This is very important if you are unable to feel pain, heat, or  cold. You may have a greater risk of getting burned.  You may try a gentle neck and shoulder rub (massage). Activity  Rest as needed.  Return to your normal activities as told by your doctor. Ask your doctor what activities are safe for you.  Do exercises as told by your doctor or physical therapist.  Do not lift anything that is heavier than 10 lb (4.5 kg) until your doctor tells you that it is safe. General instructions  Use a flat pillow when you sleep.  Do not drive while wearing a soft neck collar. If you do not have a soft neck collar, ask your doctor if it is safe to drive while your neck heals.  Ask your doctor if the medicine prescribed to you requires you to avoid driving or using heavy  machinery.  Do not use any products that contain nicotine or tobacco, such as cigarettes, e-cigarettes, and chewing tobacco. These can delay healing. If you need help quitting, ask your doctor.  Keep all follow-up visits as told by your doctor. This is important. Contact a doctor if:  Your condition does not get better with treatment. Get help right away if:  Your pain gets worse and is not helped with medicine.  You lose feeling or feel weak in your hand, arm, face, or leg.  You have a high fever.  You have a stiff neck.  You cannot control when you poop or pee (have incontinence).  You have trouble with walking, balance, or talking. Summary  Cervical radiculopathy means that a nerve in the neck is pinched or bruised.  A nerve can get pinched from a bulging disk, arthritis, an injury to the neck, or other causes.  Symptoms include pain, tingling, or loss of feeling that goes from the neck into the arm or hand.  Weakness in your arm or hand can happen in very bad cases.  Treatment may include resting, wearing a soft neck collar, and doing exercises. You might need to take medicines for pain. In very bad cases, shots or surgery may be needed. This information is not intended to replace advice given to you by your health care provider. Make sure you discuss any questions you have with your health care provider. Document Released: 08/31/2011 Document Revised: 08/02/2018 Document Reviewed: 08/02/2018 Elsevier Patient Education  2020 [employer].

## 2019-08-18 ENCOUNTER — Encounter: Payer: Self-pay | Admitting: Family Medicine

## 2019-08-18 DIAGNOSIS — M5412 Radiculopathy, cervical region: Secondary | ICD-10-CM | POA: Insufficient documentation

## 2019-08-18 NOTE — Assessment & Plan Note (Signed)
Complete pred taper. Limit motion of neck. Heat on neck or arm. Follow up in 2 weeks if not improving for X-ray.

## 2019-08-27 ENCOUNTER — Telehealth: Payer: Self-pay | Admitting: Family Medicine

## 2019-08-27 NOTE — Telephone Encounter (Signed)
Patient was seen by Dr.Bedsole on 08/12/19.  Patient was put on medication and told if it didn't work to call back.  Patient finished the medication.  Patient said the medication didn't work at all.  Patient uses [facility]

## 2019-08-28 ENCOUNTER — Other Ambulatory Visit: Payer: Self-pay | Admitting: Family Medicine

## 2019-08-28 ENCOUNTER — Ambulatory Visit (INDEPENDENT_AMBULATORY_CARE_PROVIDER_SITE_OTHER)
Admission: RE | Admit: 2019-08-28 | Discharge: 2019-08-28 | Disposition: A | Discharge status: Home / Self Care | Payer: Medicare HMO | Source: Ambulatory Visit | Attending: Family Medicine | Admitting: Family Medicine

## 2019-08-28 DIAGNOSIS — M542 Cervicalgia: Secondary | ICD-10-CM | POA: Diagnosis not present

## 2019-08-28 DIAGNOSIS — M5412 Radiculopathy, cervical region: Secondary | ICD-10-CM

## 2019-08-28 NOTE — Telephone Encounter (Signed)
Melanie Schneider came in for x-ray.  We ordered a C-Spine based on patient's last office note.

## 2019-08-28 NOTE — Telephone Encounter (Signed)
Melanie Schneider notified as instructed by telephone.  I advised her that she does not need an appointment to get the x-ray done so she can come into the office anytime  between 8:00 am to 4:00 pm to get that done.  Patient states understanding. Dr. Diona Browner,  Please put order in for x-ray.

## 2019-08-28 NOTE — Telephone Encounter (Signed)
Have pt make in person follow up appt for X-ray.

## 2019-08-28 NOTE — Telephone Encounter (Signed)
Noted.  Will review when results back.

## 2019-08-29 ENCOUNTER — Other Ambulatory Visit: Payer: Self-pay | Admitting: Family Medicine

## 2019-08-29 DIAGNOSIS — M5412 Radiculopathy, cervical region: Secondary | ICD-10-CM

## 2019-09-23 DIAGNOSIS — M5412 Radiculopathy, cervical region: Secondary | ICD-10-CM | POA: Diagnosis not present

## 2019-09-23 DIAGNOSIS — M5030 Other cervical disc degeneration, unspecified cervical region: Secondary | ICD-10-CM | POA: Diagnosis not present

## 2019-09-26 DIAGNOSIS — R69 Illness, unspecified: Secondary | ICD-10-CM | POA: Diagnosis not present

## 2019-10-01 DIAGNOSIS — M6281 Muscle weakness (generalized): Secondary | ICD-10-CM | POA: Diagnosis not present

## 2019-10-01 DIAGNOSIS — M5412 Radiculopathy, cervical region: Secondary | ICD-10-CM | POA: Diagnosis not present

## 2019-10-08 DIAGNOSIS — J449 Chronic obstructive pulmonary disease, unspecified: Secondary | ICD-10-CM | POA: Diagnosis not present

## 2019-10-08 DIAGNOSIS — Z7951 Long term (current) use of inhaled steroids: Secondary | ICD-10-CM | POA: Diagnosis not present

## 2019-10-08 DIAGNOSIS — Z6831 Body mass index (BMI) 31.0-31.9, adult: Secondary | ICD-10-CM | POA: Diagnosis not present

## 2019-10-08 DIAGNOSIS — C50919 Malignant neoplasm of unspecified site of unspecified female breast: Secondary | ICD-10-CM | POA: Diagnosis not present

## 2019-10-08 DIAGNOSIS — M5412 Radiculopathy, cervical region: Secondary | ICD-10-CM | POA: Diagnosis not present

## 2019-10-08 DIAGNOSIS — E785 Hyperlipidemia, unspecified: Secondary | ICD-10-CM | POA: Diagnosis not present

## 2019-10-08 DIAGNOSIS — R69 Illness, unspecified: Secondary | ICD-10-CM | POA: Diagnosis not present

## 2019-10-08 DIAGNOSIS — M6281 Muscle weakness (generalized): Secondary | ICD-10-CM | POA: Diagnosis not present

## 2019-10-08 DIAGNOSIS — Z7722 Contact with and (suspected) exposure to environmental tobacco smoke (acute) (chronic): Secondary | ICD-10-CM | POA: Diagnosis not present

## 2019-10-08 DIAGNOSIS — E669 Obesity, unspecified: Secondary | ICD-10-CM | POA: Diagnosis not present

## 2019-10-08 DIAGNOSIS — Z008 Encounter for other general examination: Secondary | ICD-10-CM | POA: Diagnosis not present

## 2019-10-08 DIAGNOSIS — K5900 Constipation, unspecified: Secondary | ICD-10-CM | POA: Diagnosis not present

## 2019-10-08 DIAGNOSIS — Z79811 Long term (current) use of aromatase inhibitors: Secondary | ICD-10-CM | POA: Diagnosis not present

## 2019-10-17 ENCOUNTER — Telehealth: Payer: Self-pay | Admitting: Family Medicine

## 2019-10-17 DIAGNOSIS — E538 Deficiency of other specified B group vitamins: Secondary | ICD-10-CM

## 2019-10-17 DIAGNOSIS — R7309 Other abnormal glucose: Secondary | ICD-10-CM

## 2019-10-17 DIAGNOSIS — E7800 Pure hypercholesterolemia, unspecified: Secondary | ICD-10-CM

## 2019-10-17 DIAGNOSIS — R69 Illness, unspecified: Secondary | ICD-10-CM | POA: Diagnosis not present

## 2019-10-17 NOTE — Telephone Encounter (Signed)
-----   Message from Cloyd Stagers, RT sent at 10/10/2019  1:58 PM EST ----- Regarding: Lab Orders for Monday 1.25.2021 Please place lab orders for Monday 1.25.2021, office visit for physical on Tuesday 2.2.2021 Thank you, Dyke Maes RT(R)

## 2019-10-20 ENCOUNTER — Ambulatory Visit: Payer: Medicare HMO

## 2019-10-20 ENCOUNTER — Other Ambulatory Visit: Payer: Self-pay

## 2019-10-20 ENCOUNTER — Ambulatory Visit (INDEPENDENT_AMBULATORY_CARE_PROVIDER_SITE_OTHER): Payer: Medicare HMO

## 2019-10-20 ENCOUNTER — Other Ambulatory Visit (INDEPENDENT_AMBULATORY_CARE_PROVIDER_SITE_OTHER): Payer: Medicare HMO

## 2019-10-20 VITALS — Wt 177.0 lb

## 2019-10-20 DIAGNOSIS — E7800 Pure hypercholesterolemia, unspecified: Secondary | ICD-10-CM | POA: Diagnosis not present

## 2019-10-20 DIAGNOSIS — E538 Deficiency of other specified B group vitamins: Secondary | ICD-10-CM | POA: Diagnosis not present

## 2019-10-20 DIAGNOSIS — R7309 Other abnormal glucose: Secondary | ICD-10-CM | POA: Diagnosis not present

## 2019-10-20 DIAGNOSIS — Z0000 Encounter for general adult medical examination without abnormal findings: Secondary | ICD-10-CM | POA: Diagnosis not present

## 2019-10-20 LAB — LIPID PANEL
Cholesterol: 162 mg/dL (ref 0–200)
HDL: 43.3 mg/dL (ref >39.00)
LDL Cholesterol: 104 mg/dL — ABNORMAL HIGH (ref 0–99)
NonHDL: 118.94
Total CHOL/HDL Ratio: 4
Triglycerides: 73 mg/dL (ref 0.0–149.0)
VLDL: 14.6 mg/dL (ref 0.0–40.0)

## 2019-10-20 LAB — COMPREHENSIVE METABOLIC PANEL
ALT: 10 U/L (ref 0–35)
AST: 12 U/L (ref 0–37)
Albumin: 4 g/dL (ref 3.5–5.2)
Alkaline Phosphatase: 58 U/L (ref 39–117)
BUN: 21 mg/dL (ref 6–23)
CO2: 25 mEq/L (ref 19–32)
Calcium: 9 mg/dL (ref 8.4–10.5)
Chloride: 106 mEq/L (ref 96–112)
Creatinine, Ser: 0.56 mg/dL (ref 0.40–1.20)
GFR: 105.99 mL/min (ref >60.00)
Glucose, Bld: 102 mg/dL — ABNORMAL HIGH (ref 70–99)
Potassium: 3.9 mEq/L (ref 3.5–5.1)
Sodium: 140 mEq/L (ref 135–145)
Total Bilirubin: 0.6 mg/dL (ref 0.2–1.2)
Total Protein: 6.3 g/dL (ref 6.0–8.3)

## 2019-10-20 LAB — VITAMIN B12: Vitamin B-12: 195 pg/mL — ABNORMAL LOW (ref 211–911)

## 2019-10-20 LAB — HEMOGLOBIN A1C: Hgb A1c MFr Bld: 5.7 % (ref 4.6–6.5)

## 2019-10-20 NOTE — Progress Notes (Signed)
PCP notes:  Health Maintenance: No gaps noted   Abnormal Screenings: none   Patient concerns: none   Nurse concerns: none   Next PCP appt.: 10/28/2019 @ 12 pm

## 2019-10-20 NOTE — Patient Instructions (Signed)
Melanie Schneider , Thank you for taking time to come for your Medicare Wellness Visit. I appreciate your ongoing commitment to your health goals. Please review the following plan we discussed and let me know if I can assist you in the future.   Screening recommendations/referrals: Colonoscopy: Up to date, completed 09/06/2015 Mammogram: Up to date, completed 06/26/2019 Bone Density: Up to date, completed 06/24/2018 Recommended yearly ophthalmology/optometry visit for glaucoma screening and checkup Recommended yearly dental visit for hygiene and checkup  Vaccinations: Influenza vaccine: Up to date, completed 05/28/2019 Pneumococcal vaccine: Completed series Tdap vaccine: decline Shingles vaccine: discussed    Advanced directives: Advance directive discussed with you today. I have provided a copy for you to complete at home and have notarized. Once this is complete please bring a copy in to our office so we can scan it into your chart.  Conditions/risks identified: hypertension, hypercholesterolemia  Next appointment: 10/28/2019 @ 12 pm    Preventive Care 34 Years and Older, Female Preventive care refers to lifestyle choices and visits with your health care provider that can promote health and wellness. What does preventive care include?  A yearly physical exam. This is also called an annual well check.  Dental exams once or twice a year.  Routine eye exams. Ask your health care provider how often you should have your eyes checked.  Personal lifestyle choices, including:  Daily care of your teeth and gums.  Regular physical activity.  Eating a healthy diet.  Avoiding tobacco and drug use.  Limiting alcohol use.  Practicing safe sex.  Taking low-dose aspirin every day.  Taking vitamin and mineral supplements as recommended by your health care provider. What happens during an annual well check? The services and screenings done by your health care provider during your annual well  check will depend on your age, overall health, lifestyle risk factors, and family history of disease. Counseling  Your health care provider may ask you questions about your:  Alcohol use.  Tobacco use.  Drug use.  Emotional well-being.  Home and relationship well-being.  Sexual activity.  Eating habits.  History of falls.  Memory and ability to understand (cognition).  Work and work Statistician.  Reproductive health. Screening  You may have the following tests or measurements:  Height, weight, and BMI.  Blood pressure.  Lipid and cholesterol levels. These may be checked every 5 years, or more frequently if you are over 48 years old.  Skin check.  Lung cancer screening. You may have this screening every year starting at age 16 if you have a 30-pack-year history of smoking and currently smoke or have quit within the past 15 years.  Fecal occult blood test (FOBT) of the stool. You may have this test every year starting at age 41.  Flexible sigmoidoscopy or colonoscopy. You may have a sigmoidoscopy every 5 years or a colonoscopy every 10 years starting at age 11.  Hepatitis C blood test.  Hepatitis B blood test.  Sexually transmitted disease (STD) testing.  Diabetes screening. This is done by checking your blood sugar (glucose) after you have not eaten for a while (fasting). You may have this done every 1-3 years.  Bone density scan. This is done to screen for osteoporosis. You may have this done starting at age 87.  Mammogram. This may be done every 1-2 years. Talk to your health care provider about how often you should have regular mammograms. Talk with your health care provider about your test results, treatment options, and if  necessary, the need for more tests. Vaccines  Your health care provider may recommend certain vaccines, such as:  Influenza vaccine. This is recommended every year.  Tetanus, diphtheria, and acellular pertussis (Tdap, Td) vaccine. You  may need a Td booster every 10 years.  Zoster vaccine. You may need this after age 63.  Pneumococcal 13-valent conjugate (PCV13) vaccine. One dose is recommended after age 45.  Pneumococcal polysaccharide (PPSV23) vaccine. One dose is recommended after age 40. Talk to your health care provider about which screenings and vaccines you need and how often you need them. This information is not intended to replace advice given to you by your health care provider. Make sure you discuss any questions you have with your health care provider. Document Released: 10/08/2015 Document Revised: 05/31/2016 Document Reviewed: 07/13/2015 Elsevier Interactive Patient Education  2017 [place] Prevention in the Home Falls can cause injuries. They can happen to people of all ages. There are many things you can do to make your home safe and to help prevent falls. What can I do on the outside of my home?  Regularly fix the edges of walkways and driveways and fix any cracks.  Remove anything that might make you trip as you walk through a door, such as a raised step or threshold.  Trim any bushes or trees on the path to your home.  Use bright outdoor lighting.  Clear any walking paths of anything that might make someone trip, such as rocks or tools.  Regularly check to see if handrails are loose or broken. Make sure that both sides of any steps have handrails.  Any raised decks and porches should have guardrails on the edges.  Have any leaves, snow, or ice cleared regularly.  Use sand or salt on walking paths during winter.  Clean up any spills in your garage right away. This includes oil or grease spills. What can I do in the bathroom?  Use night lights.  Install grab bars by the toilet and in the tub and shower. Do not use towel bars as grab bars.  Use non-skid mats or decals in the tub or shower.  If you need to sit down in the shower, use a plastic, non-slip stool.  Keep the floor  dry. Clean up any water that spills on the floor as soon as it happens.  Remove soap buildup in the tub or shower regularly.  Attach bath mats securely with double-sided non-slip rug tape.  Do not have throw rugs and other things on the floor that can make you trip. What can I do in the bedroom?  Use night lights.  Make sure that you have a light by your bed that is easy to reach.  Do not use any sheets or blankets that are too big for your bed. They should not hang down onto the floor.  Have a firm chair that has side arms. You can use this for support while you get dressed.  Do not have throw rugs and other things on the floor that can make you trip. What can I do in the kitchen?  Clean up any spills right away.  Avoid walking on wet floors.  Keep items that you use a lot in easy-to-reach places.  If you need to reach something above you, use a strong step stool that has a grab bar.  Keep electrical cords out of the way.  Do not use floor polish or wax that makes floors slippery. If you must  use wax, use non-skid floor wax.  Do not have throw rugs and other things on the floor that can make you trip. What can I do with my stairs?  Do not leave any items on the stairs.  Make sure that there are handrails on both sides of the stairs and use them. Fix handrails that are broken or loose. Make sure that handrails are as long as the stairways.  Check any carpeting to make sure that it is firmly attached to the stairs. Fix any carpet that is loose or worn.  Avoid having throw rugs at the top or bottom of the stairs. If you do have throw rugs, attach them to the floor with carpet tape.  Make sure that you have a light switch at the top of the stairs and the bottom of the stairs. If you do not have them, ask someone to add them for you. What else can I do to help prevent falls?  Wear shoes that:  Do not have high heels.  Have rubber bottoms.  Are comfortable and fit you  well.  Are closed at the toe. Do not wear sandals.  If you use a stepladder:  Make sure that it is fully opened. Do not climb a closed stepladder.  Make sure that both sides of the stepladder are locked into place.  Ask someone to hold it for you, if possible.  Clearly mark and make sure that you can see:  Any grab bars or handrails.  First and last steps.  Where the edge of each step is.  Use tools that help you move around (mobility aids) if they are needed. These include:  Canes.  Walkers.  Scooters.  Crutches.  Turn on the lights when you go into a dark area. Replace any light bulbs as soon as they burn out.  Set up your furniture so you have a clear path. Avoid moving your furniture around.  If any of your floors are uneven, fix them.  If there are any pets around you, be aware of where they are.  Review your medicines with your doctor. Some medicines can make you feel dizzy. This can increase your chance of falling. Ask your doctor what other things that you can do to help prevent falls. This information is not intended to replace advice given to you by your health care provider. Make sure you discuss any questions you have with your health care provider. Document Released: 07/08/2009 Document Revised: 02/17/2016 Document Reviewed: 10/16/2014 Elsevier Interactive Patient Education  2017 [employer].
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
## 2019-10-20 NOTE — Progress Notes (Signed)
No critical labs need to be addressed urgently. We will discuss labs in detail at upcoming office visit.   

## 2019-10-27 DIAGNOSIS — R69 Illness, unspecified: Secondary | ICD-10-CM | POA: Diagnosis not present

## 2019-10-28 ENCOUNTER — Other Ambulatory Visit: Payer: Self-pay

## 2019-10-28 ENCOUNTER — Encounter: Payer: Self-pay | Admitting: Family Medicine

## 2019-10-28 ENCOUNTER — Ambulatory Visit (INDEPENDENT_AMBULATORY_CARE_PROVIDER_SITE_OTHER): Payer: Medicare HMO | Admitting: Family Medicine

## 2019-10-28 VITALS — BP 122/58 | HR 64 | Temp 97.9°F | Ht 63.0 in | Wt 179.8 lb

## 2019-10-28 DIAGNOSIS — H9193 Unspecified hearing loss, bilateral: Secondary | ICD-10-CM | POA: Diagnosis not present

## 2019-10-28 DIAGNOSIS — J449 Chronic obstructive pulmonary disease, unspecified: Secondary | ICD-10-CM

## 2019-10-28 DIAGNOSIS — I10 Essential (primary) hypertension: Secondary | ICD-10-CM | POA: Diagnosis not present

## 2019-10-28 DIAGNOSIS — I700 Atherosclerosis of aorta: Secondary | ICD-10-CM

## 2019-10-28 DIAGNOSIS — F418 Other specified anxiety disorders: Secondary | ICD-10-CM

## 2019-10-28 DIAGNOSIS — Z0000 Encounter for general adult medical examination without abnormal findings: Secondary | ICD-10-CM | POA: Diagnosis not present

## 2019-10-28 DIAGNOSIS — G609 Hereditary and idiopathic neuropathy, unspecified: Secondary | ICD-10-CM

## 2019-10-28 DIAGNOSIS — R7309 Other abnormal glucose: Secondary | ICD-10-CM | POA: Diagnosis not present

## 2019-10-28 DIAGNOSIS — R69 Illness, unspecified: Secondary | ICD-10-CM | POA: Diagnosis not present

## 2019-10-28 DIAGNOSIS — I5032 Chronic diastolic (congestive) heart failure: Secondary | ICD-10-CM

## 2019-10-28 DIAGNOSIS — E538 Deficiency of other specified B group vitamins: Secondary | ICD-10-CM

## 2019-10-28 DIAGNOSIS — D0511 Intraductal carcinoma in situ of right breast: Secondary | ICD-10-CM

## 2019-10-28 DIAGNOSIS — E7800 Pure hypercholesterolemia, unspecified: Secondary | ICD-10-CM | POA: Diagnosis not present

## 2019-10-28 MED ORDER — CYANOCOBALAMIN 1000 MCG/ML IJ SOLN
1000.0000 ug | Freq: Once | INTRAMUSCULAR | Status: AC
  Administered 2019-10-28: 1000 ug via INTRAMUSCULAR

## 2019-10-28 MED ORDER — ATORVASTATIN CALCIUM 40 MG PO TABS: 40.0000 mg | ORAL_TABLET | Freq: Every day | ORAL | 3 refills | Status: DC

## 2019-10-28 NOTE — Assessment & Plan Note (Signed)
Actively treated on femara

## 2019-10-28 NOTE — Assessment & Plan Note (Signed)
Restart statin for risk factor reduction.

## 2019-10-28 NOTE — Assessment & Plan Note (Signed)
Using alprazolam rarely.

## 2019-10-28 NOTE — Assessment & Plan Note (Signed)
Well controlled. Continue current medication.  

## 2019-10-28 NOTE — Assessment & Plan Note (Signed)
Replete B12 .. pt prefers oral instead if IM  given pandemic.

## 2019-10-28 NOTE — Assessment & Plan Note (Signed)
Stable low carb diet.

## 2019-10-28 NOTE — Patient Instructions (Addendum)
Start B12 1000 mcg daily.  Restart atorvastatin.  They will call you to set up lung cancer screening program.  COVID-19 Vaccine Information can be found at: [email or web address].[email or web address] For questions related to vaccine distribution or appointments, please email [email or web address] or call [phone].   We are committed to keeping you informed about the COVID-19 vaccine.  As the vaccine continues to become available for each phase, we will ensure that patients who meet the criteria receive the information they need to access vaccination opportunities. Continue to check your MyChart account and [email or web address] for updates. Please review the Phase 1b information below.  [place] On Tuesday, Jan. 19, the [facility]) and [place] began large-scale COVID-19 vaccinations at the [place]. The vaccinations are appointment only and for those 37 and older.  Walk-ins will not be accepted.  All appointments are currently filled. Please join our waiting list for the next available appointments. We will contact you when appointments become available. Please do not sign up more than once.  Join Our Waiting List  Our daily vaccination capacity will continue to increase, including expansion of our [employer] site, increasing mobile clinics across our service region and plans to open COVID-19 vaccination clinics in [place] and [place] counties in February.  On Friday, Jan. 22, we announced the need to reschedule 10,400 vaccination appointments because of the state's decision not to allocate [facility] with COVID-19 vaccines for the week of Jan. 25. Our announcement of this news is available here.  Going forward, we will schedule vaccination appointments weekly based on vaccine allocations provided by the state.  On  Friday, Jan. 29, the [facility] announced that [employer] and [place] will again be receiving vaccines. Our announcement of this news is available here.  We will first schedule vaccination appointments for those whose appointments were cancelled last week, then schedule those on the [place] waiting list in the order in which they registered. Those who had their appointment cancelled should expect notification by email (or by phone for those without email) at least 48 hours prior to their rescheduled appointment.  Other Vaccination Opportunities in [place] We are also working in partnership with county health agencies in our service counties to ensure continuing vaccination availability in the weeks and months ahead. Learn more about each county's vaccination efforts in the website links below:   [place] 's phase 1b vaccination guidelines, prioritizing those 65 and over as the next eligible group to receive the COVID-19 vaccine, are detailed at [email or web address].[email or web address].   Vaccine Safety and Effectiveness Clinical trials for the Pfizer COVID-19 vaccine involved 42,000 people and showed that the vaccine is more than 95% effective in preventing COVID-19 with no serious safety concerns. Similar results have been reported for the Moderna COVID-19 vaccine. Side effects reported in the [place] clinical trials include a sore arm at the injection site, fatigue, headache, chills and fever. While side effects from the [place] COVID-19 vaccine are higher than for a typical flu vaccine, they are lower in many ways than side effects from the leading vaccine to prevent shingles. Side effects are signs that a vaccine is working and are related to your immune system being stimulated to produce antibodies against infection. Side effects from vaccination are far less significant  than health impacts from COVID-19.  Staying Informed Pharmacists, infectious disease doctors, critical care nurses and other experts at [facility] continue to speak publicly through media interviews and direct communication with our patients and communities about the safety, effectiveness and importance of vaccines to eliminate COVID-19. In addition, reliable information on vaccine safety, effectiveness, side effects and more is available on the following websites:  [place] Department of Health and Human Services COVID-19 Vaccine Information Website.  U.S. Centers for Disease Control and Prevention [id number] Human resources officer.  Staying Safe We agree with the CDC on what we can do to help our communities get back to normal: Getting "back to normal" is going to take all of our tools. If we use all the tools we have, we stand the best chance of getting our families, communities, schools and workplaces "back to normal" sooner:  Get vaccinated as soon as vaccines become available within the phase of the state's vaccination rollout plan for which you meet the eligibility criteria.  Wear a mask.  Stay 6 feet from others and avoid crowds.  Wash hands often.  For our most current information, please visit [email or web address].

## 2019-10-28 NOTE — Progress Notes (Signed)
Chief Complaint  Patient presents with  . Medicare Wellness    part 2     History of Present Illness: HPI  The patient presents for annual medicare wellness, complete physical and review of chronic health problems. He/She also has the following acute concerns today:  She has noted decreased hearing  In last several years.  The patient saw a LPN or RN for medicare wellness visit.  Prevention and wellness was reviewed in detail. Note reviewed and important notes copied below. 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 Onc Dr. Lindi Adie. Reviewed last OV from  06/2019  Cervical radiculopathy..  Did PT and now doing home PT given cost.  Pain is improved and less tingling.  Elevated Cholesterol:  Not taking atorvastatin... > 10 % 10 year CVD risk given past CVA. Pt never restarted statin. Not sure why. Lab Results  Component Value Date   CHOL 162 10/20/2019   HDL 43.30 10/20/2019   LDLCALC 104 (H) 10/20/2019   LDLDIRECT 119.9 10/07/2013   TRIG 73.0 10/20/2019   CHOLHDL 4 10/20/2019  Using medications without problems: Muscle aches:  Diet compliance: moderate, does eat cookies Exercise: limited Other complaints:   B12 def:  Has noted worse memory.. no taking a supplement. Not interested in B12 inj given COVID  Hypertension:     At goal on no medication. BP Readings from Last 3 Encounters:  10/28/19 (!) 122/58  08/12/19 140/70  07/03/19 127/68  Using medication without problems or lightheadedness:  Chest pain with exertion: Edema: Short of breath: Average home BPs: Other issues:  COPD moderate control  Cannot afford symbicort. Using only as needed given cost. Not requiring albuterol rescue   Peripheral neuropathy: tolerable control. Prediabetes  Lab Results  Component Value Date   HGBA1C 5.7 10/20/2019   Situational anxiety: rare alprazolam use   This visit occurred during the SARS-CoV-2 public health emergency.  Safety protocols were in place, including screening questions prior to the visit, additional usage of staff PPE, and extensive cleaning of exam room while observing appropriate contact time as indicated for disinfecting solutions.   COVID 19 screen:  No recent travel or known exposure to COVID19 The patient denies respiratory symptoms of COVID 19 at this time. The importance of social distancing was discussed today.     Review of Systems  Constitutional: Negative for chills and fever.  HENT: Negative for congestion and ear pain.   Eyes: Negative for pain and redness.  Respiratory: Negative for cough and shortness of breath.   Cardiovascular: Negative for chest pain, palpitations and leg swelling.  Gastrointestinal: Negative for abdominal pain, blood in stool, constipation, diarrhea, nausea and vomiting.  Genitourinary: Negative for dysuria.  Musculoskeletal: Negative for falls and myalgias.  Skin: Negative for rash.  Neurological: Negative for dizziness.  Psychiatric/Behavioral: Negative for depression. The  patient is not nervous/anxious.       Past Medical History:  Diagnosis Date  . Breast cancer, left ([place]) 06/2017  . COPD (chronic obstructive pulmonary disease) (HCC)    states coughs frequently; occasional SOB with ADLs; no home O2  . Ductal carcinoma in situ (DCIS) of right  breast 06/2017  . Dysthymia   . Family history of adverse reaction to anesthesia    states father had reaction to anesthesia and had to be resuscitated  . History of CVA (cerebrovascular accident) 2014  . Hyperlipidemia   . Morbid obesity due to excess calories ([place]) complicated by hyperlipidemia 12/13/2006   Qualifier: Diagnosis of  By: Diona Browner MD, Silvana Holecek    . Peripheral neuropathy    lower legs - walks with a straight cane    reports that she quit smoking about 6 years ago. Her smoking use included cigarettes. She has a 25.50 pack-year smoking history. She has never used smokeless tobacco. She reports that she does not drink alcohol or use drugs.   Current Outpatient Medications:  .  albuterol (PROVENTIL HFA;VENTOLIN HFA) 108 (90 Base) MCG/ACT inhaler, Inhale 2 puffs into the lungs every 6 (six) hours as needed for wheezing or shortness of breath (with aerochamber)., Disp: 1 Inhaler, Rfl: 3 .  ALPRAZolam (XANAX) 0.25 MG tablet, TAKE 1 TABLET BY MOUTH ONCE DAILY AS NEEDED FOR ANXIETY, Disp: 30 tablet, Rfl: 0 .  Clobetasol Prop Emollient Base (CLOBETASOL PROPIONATE E) 0.05 % emollient cream, Apply to affected area twice daily, Disp: 30 g, Rfl: 0 .  letrozole (FEMARA) 2.5 MG tablet, Take 1 tablet (2.5 mg total) by mouth daily., Disp: 90 tablet, Rfl: 3 .  atorvastatin (LIPITOR) 40 MG tablet, TAKE ONE TABLET BY MOUTH ONCE DAILY (Patient not taking: Reported on 10/28/2019), Disp: 90 tablet, Rfl: 3 No current facility-administered medications for this visit.  Facility-Administered Medications Ordered in Other Visits:  .  SONAFINE emulsion 1 application, 1 application, Topical, BID, Hayden Pedro, PA-C, 1 application at [id number]   Observations/Objective: Blood pressure (!) 122/58, pulse 64, temperature 97.9 F (36.6 C), height 5\' 3"  (1.6 m), weight 179 lb 12.8 oz (81.6 kg), SpO2 97 %.  Physical Exam Constitutional:      General: She is not in acute distress.    Appearance: Normal  appearance. She is well-developed. She is obese. She is not ill-appearing or toxic-appearing.  HENT:     Head: Normocephalic.     Right Ear: Hearing, tympanic membrane, ear canal and external ear normal. Tympanic membrane is not erythematous, retracted or bulging.     Left Ear: Hearing, tympanic membrane, ear canal and external ear normal. Tympanic membrane is not erythematous, retracted or bulging.     Nose: No mucosal edema or rhinorrhea.     Right Sinus: No maxillary sinus tenderness or frontal sinus tenderness.     Left Sinus: No maxillary sinus tenderness or frontal sinus tenderness.     Mouth/Throat:     Pharynx: Uvula midline.  Eyes:     General: Lids are normal. Lids are everted, no foreign bodies appreciated.     Conjunctiva/sclera: Conjunctivae normal.     Pupils: Pupils are equal, round, and reactive to light.  Neck:     Thyroid: No thyroid mass or thyromegaly.     Vascular: No carotid bruit.     Trachea: Trachea normal.  Cardiovascular:     Rate and Rhythm: Normal rate and regular rhythm.     Pulses: Normal pulses.  Heart sounds: Normal heart sounds, S1 normal and S2 normal. No murmur. No friction rub. No gallop.   Pulmonary:     Effort: Pulmonary effort is normal. No tachypnea or respiratory distress.     Breath sounds: Normal breath sounds. No decreased breath sounds, wheezing, rhonchi or rales.  Abdominal:     General: Bowel sounds are normal.     Palpations: Abdomen is soft.     Tenderness: There is no abdominal tenderness.  Musculoskeletal:     Cervical back: Normal range of motion and neck supple.  Skin:    General: Skin is warm and dry.     Findings: No rash.  Neurological:     Mental Status: She is alert.  Psychiatric:        Mood and Affect: Mood is not anxious or depressed.        Speech: Speech normal.        Behavior: Behavior normal. Behavior is cooperative.        Thought Content: Thought content normal.        Judgment: Judgment normal.       Assessment and Plan   The patient's preventative maintenance and recommended screening tests for an annual wellness exam were reviewed in full today. Brought up to date unless services declined.  Counselled on the importance of diet, exercise, and its role in overall health and mortality. The patient's FH and SH was reviewed, including their home life, tobacco status, and drug and alcohol status.   Vaccines:uptodate except shingles. Pap/DVE:not indicated, s/p TAH, sister with ovarian cancer Mammo:personal hx of  breast cancer followed by ONC 06/2019 Bone Density: 2019 Colon:08/2015, plan repeat in 10 years. Smoking Status:former smoker, > 40 yearsin lung cancer screen protocol. ETOH/ drug use:no ETOH in several years/ occ marijuana use Hep C:done  Bilateral hearing loss Not interested in referral for audiology.   Vitamin B12 deficiency Replete B12 .. pt prefers oral instead if IM  given pandemic.  Situational anxiety Using alprazolam rarely.  PURE HYPERCHOLESTEROLEMIA High risk given past CVA. Restart statin. rx sent in. Pt agreeable.  HTN (hypertension) Well controlled. Continue current medication.   PREDIABETES Stable low carb diet.  Hereditary and idiopathic peripheral neuropathy Stable control  Ductal carcinoma in situ (DCIS) of right breast Actively treated on femara  Chronic diastolic heart failure Euvolemic today.  Aortic atherosclerosis (HCC) Restart statin for risk factor reduction.  COPD  GOLD 0 / AB  Stable control. Refuses controller inhaler given cost.   Eliezer Lofts, MD

## 2019-10-28 NOTE — Assessment & Plan Note (Signed)
High risk given past CVA. Restart statin. rx sent in. Pt agreeable.

## 2019-10-28 NOTE — Assessment & Plan Note (Signed)
Euvolemic today. 

## 2019-10-28 NOTE — Assessment & Plan Note (Signed)
Stable control. Refuses controller inhaler given cost.

## 2019-10-28 NOTE — Assessment & Plan Note (Signed)
Not interested in referral for audiology.

## 2019-10-28 NOTE — Assessment & Plan Note (Signed)
Stable control. 

## 2019-11-06 ENCOUNTER — Other Ambulatory Visit: Payer: Self-pay | Admitting: *Deleted

## 2019-11-06 DIAGNOSIS — Z87891 Personal history of nicotine dependence: Secondary | ICD-10-CM

## 2019-11-24 DIAGNOSIS — R69 Illness, unspecified: Secondary | ICD-10-CM | POA: Diagnosis not present

## 2019-12-01 ENCOUNTER — Ambulatory Visit
Admission: RE | Admit: 2019-12-01 | Discharge: 2019-12-01 | Disposition: A | Discharge status: Home / Self Care | Payer: Medicare HMO | Source: Ambulatory Visit | Attending: Acute Care | Admitting: Acute Care

## 2019-12-01 ENCOUNTER — Other Ambulatory Visit: Payer: Self-pay

## 2019-12-01 DIAGNOSIS — Z87891 Personal history of nicotine dependence: Secondary | ICD-10-CM | POA: Diagnosis not present

## 2019-12-02 ENCOUNTER — Inpatient Hospital Stay: Admission: RE | Admit: 2019-12-02 | Payer: Medicare HMO | Source: Ambulatory Visit

## 2019-12-04 ENCOUNTER — Other Ambulatory Visit: Payer: Self-pay | Admitting: *Deleted

## 2019-12-04 ENCOUNTER — Telehealth: Payer: Self-pay | Admitting: Acute Care

## 2019-12-04 DIAGNOSIS — Z87891 Personal history of nicotine dependence: Secondary | ICD-10-CM

## 2019-12-04 DIAGNOSIS — N289 Disorder of kidney and ureter, unspecified: Secondary | ICD-10-CM

## 2019-12-04 NOTE — Progress Notes (Signed)
Please call patient and let them  know their  low dose Ct was read as a Lung RADS 1, negative study: no nodules or definitely benign nodules. Radiology recommendation is for a repeat LDCT in 12 months. .Please let them  know we will order and schedule their  annual screening scan for 11/2020. Please let them  know there was notation of CAD on their  scan.  Please remind the patient  that this is a non-gated exam therefore degree or severity of disease  cannot be determined. Please have them  follow up with their PCP regarding potential risk factor modification, dietary therapy or pharmacologic therapy if clinically indicated. Pt.  is  currently on statin therapy. Please place order for annual  screening scan for  11/2020 and fax results to PCP. Thanks so much.  There was notation of an area in her right kidney that needs follow up. I will call Dr. Rometta Emery office for follow up as she feels is clinically indicated.   Mildly hyperdense lesion off the interpolar right kidney is partially imaged and exhibits slight growth from 11/24/2016. Lesion cannot be characterized as a simple cyst. If further evaluation is desired, pre and post-contrast CT abdomen is recommended.

## 2019-12-04 NOTE — Telephone Encounter (Signed)
I have reviewed Ms.  Gallentine's low dose CT chest. From  a lung cancer perspective, her scan was read as a Lung RADS 1, negative study: no nodules or definitely benign nodules. Radiology recommendation is for a repeat LDCT in 12 months.We will order and schedule her annual  scan in 12 months.   There was notation of an area in her right kidney that needs follow up. I wanted to make sure you are aware of this and that you follow up as you feel is clinically indicated.   I tried calling your office and was on hold for 10 minutes so I had to hang up.   Mildly hyperdense lesion off the interpolar right kidney is partially imaged and exhibits slight growth from 11/24/2016. Lesion cannot be characterized as a simple cyst. If further evaluation is desired, pre and post-contrast CT abdomen is recommended.  Please let me know if you have any questions or concerns. Thanks so much

## 2019-12-05 NOTE — Telephone Encounter (Signed)
Mrs. Hutzel notified as instructed by telephone.  She states she would prefer a referral to Urology.

## 2019-12-05 NOTE — Addendum Note (Signed)
Addended by: Eliezer Lofts E on: 12/05/2019 04:45 PM   Modules accepted: Orders

## 2019-12-05 NOTE — Telephone Encounter (Signed)
Call   Area seen in kidney on  her lung screening CT scan that needs to be evaluated further.Marland Kitchen a CT scan is recommended to eval or we can refer to URology for their recommendations on further evaluation.let Me know what she would prefer.  Mildly hyperdense lesion off the interpolar right kidney is partially imaged and exhibits slight growth from 11/24/2016. Lesion cannot be characterized as a simple cyst.

## 2020-01-15 DIAGNOSIS — R69 Illness, unspecified: Secondary | ICD-10-CM | POA: Diagnosis not present

## 2020-01-22 DIAGNOSIS — D49511 Neoplasm of unspecified behavior of right kidney: Secondary | ICD-10-CM | POA: Diagnosis not present

## 2020-01-22 DIAGNOSIS — D4101 Neoplasm of uncertain behavior of right kidney: Secondary | ICD-10-CM | POA: Diagnosis not present

## 2020-01-23 DIAGNOSIS — R69 Illness, unspecified: Secondary | ICD-10-CM | POA: Diagnosis not present

## 2020-01-24 DIAGNOSIS — R69 Illness, unspecified: Secondary | ICD-10-CM | POA: Diagnosis not present

## 2020-02-03 ENCOUNTER — Telehealth: Payer: Self-pay | Admitting: Family Medicine

## 2020-02-03 NOTE — Telephone Encounter (Signed)
Patient called She stated she has an appointment with urology on Thursday.  She stated that she is allergic to iodine and they are an MRI with contrast and told her to take benadryl and prednisone before she has this done. Patient is very concerned about doing this with contrast. She is concerned she may have a reaction to this.   Patient would like you advice on what you think .

## 2020-02-03 NOTE — Telephone Encounter (Signed)
We do this frequently.This is a typical protocol to prevent reactions to dye/iodine to get an important study. It significantly reduces the likely hood of a reaction.

## 2020-02-03 NOTE — Telephone Encounter (Signed)
Melanie Schneider notified as instructed by telephone.  Patient states understanding.

## 2020-02-05 DIAGNOSIS — C641 Malignant neoplasm of right kidney, except renal pelvis: Secondary | ICD-10-CM | POA: Diagnosis not present

## 2020-02-05 DIAGNOSIS — D4101 Neoplasm of uncertain behavior of right kidney: Secondary | ICD-10-CM | POA: Diagnosis not present

## 2020-02-10 DIAGNOSIS — N281 Cyst of kidney, acquired: Secondary | ICD-10-CM | POA: Diagnosis not present

## 2020-02-24 DIAGNOSIS — R69 Illness, unspecified: Secondary | ICD-10-CM | POA: Diagnosis not present

## 2020-03-02 DIAGNOSIS — R69 Illness, unspecified: Secondary | ICD-10-CM | POA: Diagnosis not present

## 2020-03-25 DIAGNOSIS — R69 Illness, unspecified: Secondary | ICD-10-CM | POA: Diagnosis not present

## 2020-04-25 DIAGNOSIS — R69 Illness, unspecified: Secondary | ICD-10-CM | POA: Diagnosis not present

## 2020-04-26 DIAGNOSIS — R69 Illness, unspecified: Secondary | ICD-10-CM | POA: Diagnosis not present

## 2020-05-19 ENCOUNTER — Telehealth: Payer: Self-pay

## 2020-05-19 NOTE — Telephone Encounter (Signed)
[facility] - Client TELEPHONE ADVICE RECORD AccessNurse Patient Name: MARIEA MCMARTIN Gender: Female DOB: 1946-02-02 Age: 74 Y 7 D Return Phone Number: [phone] (Primary), [phone] (Secondary) [place] [address] Client [facility] - Client Client Site [facility] - Day Physician Eliezer Lofts - MD Contact Type Call Who Is Calling Patient / Member / Family / Caregiver Call Type Triage / Clinical Relationship To Patient Self Return Phone Number [phone] (Primary) Chief Complaint Leg Pain Reason for Call Symptomatic / Request for [place] states she has leg pain that has been going on for a month and wants to make sure its not a blood clot. Caller states that she has cramping in that same leg as well. Translation No Nurse Assessment Nurse: Raphael Gibney, RN, Vanita Ingles Date/Time (Eastern Time): 05/19/2020 11:33:23 AM Confirm and document reason for call. If symptomatic, describe symptoms. ---Caller states she has had cramping between her left knee and foot in the early am and goes away. has had leg pain for a month. No swelling. she can walk. no fever. Has the patient had close contact with a person known or suspected to have the novel coronavirus illness OR traveled / lives in area with major community spread (including international travel) in the last 14 days from the onset of symptoms? * If Asymptomatic, screen for exposure and travel within the last 14 days. ---No Does the patient have any new or worsening symptoms? ---Yes Will a triage be completed? ---Yes Related visit to physician within the last 2 weeks? ---No Does the PT have any chronic conditions? (i.e. diabetes, asthma, this includes High risk factors for pregnancy, etc.) ---No Is this a behavioral health or substance abuse call? ---No Guidelines Guideline Title Affirmed  Question Affirmed Notes Nurse Date/Time Eilene Ghazi Time) Leg Pain [1] Thigh or calf pain AND [2] only 1 side AND [3] present > 1 hour (Exception: chronic unchanged pain) Raphael Gibney, RN, Vanita Ingles 05/19/2020 11:36:04 AM Disp. Time Eilene Ghazi Time) Disposition Final User PLEASE NOTE: All timestamps contained within this report are represented as [place] Standard Time. CONFIDENTIALTY NOTICE: This fax transmission is intended only for the addressee. It contains information that is legally privileged, confidential or otherwise protected from use or disclosure. If you are not the intended recipient, you are strictly prohibited from reviewing, disclosing, copying using or disseminating any of this information or taking any action in reliance on or regarding this information. If you have received this fax in error, please notify us immediately by telephone so that we can arrange for its return to [place]. Phone: [phone], Toll-Free: [phone], Fax: [phone] Page: 2 of 2 Call Id: [id number] 05/19/2020 11:39:13 AM See HCP within 4 Hours (or PCP triage) Yes Raphael Gibney, RN, Doreatha Lew Disagree/Comply Disagree Caller Understands Yes PreDisposition Call Doctor Care Advice Given Per Guideline SEE HCP WITHIN 4 HOURS (OR PCP TRIAGE): * IF OFFICE WILL BE OPEN: You need to be seen within the next 3 or 4 hours. Call your doctor (or NP/PA) now or as soon as the office opens. CALL EMS IF: * You develop any chest pain or shortness of breath. CARE ADVICE given per Leg Pain (Adult) guideline. Comments User: Dannielle Burn, RN Date/Time Eilene Ghazi Time): 05/19/2020 11:41:29 AM pt does not want to go to urgent care but wants to make appt for tomorrow or Firday. Please call pt back. Referrals GO TO FACILITY REFUSED

## 2020-05-19 NOTE — Telephone Encounter (Signed)
Noted  

## 2020-05-19 NOTE — Telephone Encounter (Signed)
I spoke with pt;pt having lt lower leg pain that is dull and comes and goes for 1 month; pt said she has more dull pain in left hip that comes and goes for 1 month. Now pain level is 2. But when hip hurts the pain level can go to 8. Hip can hurt whether up moving around or sitting. Pt said the time of day does not affect when has hip pain. No known injury. Pt said sometimes the lt hip pain goes to the lt buttocks; pt is sure the pain does not originate in the buttocks. No redness and warmth to lower leg and no swelling. Pt said the lower leg pain is  Usually in the morning and is more like a leg cramp. No UTI symptoms. Pt does have constipation; last BM was 05/17/20. Pt does have blood on tissue when wipes due to straining to get BM out. Pt has no covid symptoms, no travel and no known exposure to + covid. Pt had pfizer covid vaccine at park in [place] 11/11/19 and 12/02/19. No CP or SOB. Pt does not want to go to UC or ED but does want appt in office and only wants to see Dr Diona Browner. Pt scheduled in office appt on 05/21/20 at 10:40 with Dr Diona Browner. UC & ED precautions given for prior to appt scheduled and pt voiced understanding. FYI to Dr Diona Browner and Butch Penny CMA.

## 2020-05-21 ENCOUNTER — Encounter: Payer: Self-pay | Admitting: Family Medicine

## 2020-05-21 ENCOUNTER — Other Ambulatory Visit: Payer: Self-pay

## 2020-05-21 ENCOUNTER — Ambulatory Visit (INDEPENDENT_AMBULATORY_CARE_PROVIDER_SITE_OTHER): Payer: Medicare HMO | Admitting: Family Medicine

## 2020-05-21 VITALS — BP 118/60 | HR 57 | Temp 98.3°F | Ht 63.0 in | Wt 184.0 lb

## 2020-05-21 DIAGNOSIS — M5416 Radiculopathy, lumbar region: Secondary | ICD-10-CM | POA: Diagnosis not present

## 2020-05-21 DIAGNOSIS — K5909 Other constipation: Secondary | ICD-10-CM

## 2020-05-21 MED ORDER — PREDNISONE 20 MG PO TABS: ORAL_TABLET | ORAL | 0 refills | Status: DC

## 2020-05-21 NOTE — Assessment & Plan Note (Addendum)
Fairly good ROM in bilateral hips, no focal anterior or lateral pain suggesting hip OA or bursitis. Mainly ttp in left sciatic notch and mildly positive SLR on left suggesting radiculopathy.  Will treat with heat, prednisone taper and start home PT.   If not improving consider X-ray and formal PT.

## 2020-05-21 NOTE — Assessment & Plan Note (Signed)
She never tried miralax.. will try this.Marland Kitchen if not improving consider [facility] referral vs trial of amitiza low dose.

## 2020-05-21 NOTE — Patient Instructions (Addendum)
Keep up with fluids.. 64 oz  Water daily. Start MIRALAX 17 g daily  To as needed for constipation.  Start home physical therapy and heat on low back and buttock.  Start prednisone taper.  Follow up if not improving in 2-4 weeks

## 2020-05-21 NOTE — Progress Notes (Signed)
Chief Complaint  Patient presents with  . Hip Pain    Left   . Calf Cramps    Left  . Constipation    History of Present Illness: HPI    74 year old female presents with new onset pain in left hip and cramping in left leg.  She reports 1 month of left  Hip pain.. moves around from left lateral hip to anterior hip to left buttock. Pain is intermittent.. 6/10.Marland Kitchen worse with standing. Better with rest. Hurts to lay on left side. Intermittent pain in left low back.  She has been less active in last year. No radiation of pain to leg. No numbness no weakness in left leg. Occ feels like it may give out. No incontinence.  No known fall, no injury.  Left lower leg cramping off and on, toes cramping.   Hx of cervical radiculopathy.  She also has issues with  Chronic constipation. At last OV: Improved with glycerine but not ideal.  Not interested in amitiza after diarrhea with linzess. Encouraged her to exercise, add miralax and increase fiber in diet.  Today she reports very large stools.. has to strain. She is currently using fiber and trying to drink 3 glasses of water a day. Metamucil   This visit occurred during the SARS-CoV-2 public health emergency.  Safety protocols were in place, including screening questions prior to the visit, additional usage of staff PPE, and extensive cleaning of exam room while observing appropriate contact time as indicated for disinfecting solutions.   COVID 19 screen:  No recent travel or known exposure to COVID19 The patient denies respiratory symptoms of COVID 19 at this time. The importance of social distancing was discussed today.     Review of Systems  Constitutional: Negative for chills and fever.  HENT: Negative for congestion and ear pain.   Eyes: Negative for pain and redness.  Respiratory: Negative for cough and shortness of breath.   Cardiovascular: Negative for chest pain, palpitations and leg swelling.  Gastrointestinal:  Positive for constipation. Negative for abdominal pain, diarrhea, nausea and vomiting.  Genitourinary: Negative for dysuria.  Musculoskeletal: Negative for falls and myalgias.  Skin: Negative for rash.  Neurological: Negative for dizziness.  Psychiatric/Behavioral: Negative for depression. The patient is not nervous/anxious.       Past Medical History:  Diagnosis Date  . Breast cancer, left ([place]) 06/2017  . COPD (chronic obstructive pulmonary disease) ([facility])    states coughs frequently; occasional SOB with ADLs; no home O2  . 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 Dysthymia   . Family history of adverse reaction to anesthesia    states father had reaction to anesthesia and had to be resuscitated  . History of CVA (cerebrovascular accident) 2014  . Hyperlipidemia   . Morbid obesity due to excess calories ([place]) complicated by hyperlipidemia 12/13/2006   Qualifier: Diagnosis of  By: Diona Browner MD, Keison Glendinning    . Peripheral neuropathy    lower legs - walks with a straight cane    reports that she quit smoking about 6 years ago. Her smoking use included cigarettes. She has a 25.50 pack-year smoking history. She has never used smokeless tobacco. She reports that she does not drink alcohol and does not use drugs.   Current Outpatient Medications:  .  albuterol (PROVENTIL HFA;VENTOLIN HFA) 108 (90 Base) MCG/ACT inhaler, Inhale 2 puffs into the lungs every 6 (six) hours as needed for wheezing or shortness of breath (with aerochamber)., Disp:  1 Inhaler, Rfl: 3 .  ALPRAZolam (XANAX) 0.25 MG tablet, TAKE 1 TABLET BY MOUTH ONCE DAILY AS NEEDED FOR ANXIETY, Disp: 30 tablet, Rfl: 0 .  atorvastatin (LIPITOR) 40 MG tablet, Take 1 tablet (40 mg total) by mouth daily., Disp: 90 tablet, Rfl: 3 .  Clobetasol Prop Emollient Base (CLOBETASOL PROPIONATE E) 0.05 % emollient cream, Apply to affected area twice daily, Disp: 30 g, Rfl: 0 .  letrozole (FEMARA) 2.5 MG tablet, Take 1 tablet (2.5 mg total)  by mouth daily., Disp: 90 tablet, Rfl: 3 No current facility-administered medications for this visit.  Facility-Administered Medications Ordered in Other Visits:  .  SONAFINE emulsion 1 application, 1 application, Topical, BID, Hayden Pedro, PA-C, 1 application at 36/14/43 1521   Observations/Objective: Blood pressure 118/60, pulse (!) 57, temperature 98.3 F (36.8 C), temperature source Temporal, height 5\' 3"  (1.6 m), weight 184 lb (83.5 kg), SpO2 97 %.  Physical Exam Constitutional:      General: She is not in acute distress.    Appearance: Normal appearance. She is well-developed. She is obese. She is not ill-appearing or toxic-appearing.  HENT:     Head: Normocephalic.     Right Ear: Hearing, tympanic membrane, ear canal and external ear normal. Tympanic membrane is not erythematous, retracted or bulging.     Left Ear: Hearing, tympanic membrane, ear canal and external ear normal. Tympanic membrane is not erythematous, retracted or bulging.     Nose: No mucosal edema or rhinorrhea.     Right Sinus: No maxillary sinus tenderness or frontal sinus tenderness.     Left Sinus: No maxillary sinus tenderness or frontal sinus tenderness.     Mouth/Throat:     Pharynx: Uvula midline.  Eyes:     General: Lids are normal. Lids are everted, no foreign bodies appreciated.     Conjunctiva/sclera: Conjunctivae normal.     Pupils: Pupils are equal, round, and reactive to light.  Neck:     Thyroid: No thyroid mass or thyromegaly.     Vascular: No carotid bruit.     Trachea: Trachea normal.  Cardiovascular:     Rate and Rhythm: Normal rate and regular rhythm.     Pulses: Normal pulses.     Heart sounds: Normal heart sounds, S1 normal and S2 normal. No murmur heard.  No friction rub. No gallop.   Pulmonary:     Effort: Pulmonary effort is normal. No tachypnea or respiratory distress.     Breath sounds: Normal breath sounds. No decreased breath sounds, wheezing, rhonchi or rales.   Abdominal:     General: Bowel sounds are normal.     Palpations: Abdomen is soft.     Tenderness: There is no abdominal tenderness.  Musculoskeletal:     Cervical back: Normal range of motion and neck supple.     Lumbar back: Tenderness present. No spasms or bony tenderness. Decreased range of motion. Positive left straight leg raise test. Negative right straight leg raise test.     Right hip: No tenderness or bony tenderness. Normal range of motion. Normal strength.     Left hip: No tenderness or bony tenderness. Normal range of motion. Normal strength.     Right upper leg: Normal.     Left upper leg: Normal.     Right knee: Normal.     Left knee: Normal.  Skin:    General: Skin is warm and dry.     Findings: No rash.  Neurological:  Mental Status: She is alert.  Psychiatric:        Mood and Affect: Mood is not anxious or depressed.        Speech: Speech normal.        Behavior: Behavior normal. Behavior is cooperative.        Thought Content: Thought content normal.        Judgment: Judgment normal.      Assessment and Plan   Lumbar radiculopathy Fairly good ROM in bilateral hips, no focal anterior or lateral pain suggesting hip OA or bursitis. Mainly ttp in left sciatic notch and mildly positive SLR on left suggesting radiculopathy.  Will treat with heat, prednisone taper and start home PT.   If not improving consider X-ray and formal PT.  Chronic constipation She never tried miralax.. will try this.Marland Kitchen if not improving consider GIO referral vs trial of amitiza low dose.    Eliezer Lofts, MD

## 2020-05-26 DIAGNOSIS — R69 Illness, unspecified: Secondary | ICD-10-CM | POA: Diagnosis not present

## 2020-06-25 DIAGNOSIS — R69 Illness, unspecified: Secondary | ICD-10-CM | POA: Diagnosis not present

## 2020-06-28 DIAGNOSIS — Z1231 Encounter for screening mammogram for malignant neoplasm of breast: Secondary | ICD-10-CM | POA: Diagnosis not present

## 2020-06-28 LAB — HM MAMMOGRAPHY

## 2020-06-30 ENCOUNTER — Encounter: Payer: Self-pay | Admitting: Family Medicine

## 2020-07-01 NOTE — Progress Notes (Signed)
Patient Care Team: Jinny Sanders, MD as PCP - General Erroll Luna, MD as Consulting Physician (General Surgery) Nicholas Lose, MD as Consulting Physician (Hematology and Oncology) Kyung Rudd, MD as Consulting Physician (Radiation Oncology) Delice Bison Charlestine Massed, NP as Nurse Practitioner (Hematology and Oncology)  DIAGNOSIS:    ICD-10-CM   1. Ductal carcinoma in situ (DCIS) of right breast  D05.11     SUMMARY OF ONCOLOGIC HISTORY: Oncology History  Ductal carcinoma in situ (DCIS) of right breast  06/28/2017 Initial Diagnosis   Ductal carcinoma in situ (DCIS) of right breast   07/24/2017 Surgery   Right lumpectomy: DCIS intermediate grade 0.6 cm, Tis NX stage 0   08/29/2017 - 10/01/2017 Radiation Therapy   Adjuvant radiation therapy   History of breast cancer  06/26/2017 Initial Diagnosis   Screening detected right breast calcifications and architectural distortion on the left breast; right breast calcs 1.1 cm normal biopsy DCIS ER 90%, PR 95%, grade 1-2; Tis Nx stage 0 left breast distortion 1.8 cm mass by ultrasound biopsy: IDC with DCIS ER 95%, PR 5%, Ki-67 15%, HER-2 negative ratio 1.39, grade 1-2, T1c N0 stage IA clinical stage   07/24/2017 Surgery   Left lumpectomy: IDC grade 2, 1.5 cm, intermediate grade DCIS, anterior margin broadly positive, posterior margin focally positive, lymphovascular invasion present, 0/2 lymph nodes negative T1CN0 stage I a   08/29/2017 - 10/01/2017 Radiation Therapy   Adjuvant radiation therapy   10/2017 -  Anti-estrogen oral therapy   Letrozole daily     CHIEF COMPLIANT: Follow-up of left breast cancer on letrozole  INTERVAL HISTORY: Melanie Schneider is a 74 y.o. with above-mentioned history of left breast cancer treated with lumpectomy, radiation, and who is currently on oral antiestrogen therapy with letrozole. She presents to the clinic today for annual follow-up.  She is experiencing left hip musculoskeletal symptoms.  She was  treated with prednisone with resolution of those symptoms.  She does not exercise.  She spends most of the day sedentary playing video games and such.  ALLERGIES:  is allergic to iodine, other, povidone-iodine, adhesive [tape], and latex.  MEDICATIONS:  Current Outpatient Medications  Medication Sig Dispense Refill  . albuterol (PROVENTIL HFA;VENTOLIN HFA) 108 (90 Base) MCG/ACT inhaler Inhale 2 puffs into the lungs every 6 (six) hours as needed for wheezing or shortness of breath (with aerochamber). 1 Inhaler 3  . atorvastatin (LIPITOR) 40 MG tablet Take 1 tablet (40 mg total) by mouth daily. 90 tablet 3  . letrozole (FEMARA) 2.5 MG tablet Take 1 tablet (2.5 mg total) by mouth daily. 90 tablet 3   No current facility-administered medications for this visit.   Facility-Administered Medications Ordered in Other Visits  Medication Dose Route Frequency Provider Last Rate Last Admin  . SONAFINE emulsion 1 application  1 application Topical BID Hayden Pedro, [place]   1 application at 14/43/15 1521    PHYSICAL EXAMINATION: ECOG PERFORMANCE STATUS: 1 - Symptomatic but completely ambulatory  Vitals:   07/02/20 1119  BP: 126/71  Pulse: 72  Resp: 18  Temp: 97.8 F (36.6 C)  SpO2: 95%   Filed Weights   07/02/20 1119  Weight: 182 lb 9.6 oz (82.8 kg)    BREAST: No palpable masses or nodules in either right or left breasts. No palpable axillary supraclavicular or infraclavicular adenopathy no breast tenderness or nipple discharge. (exam performed in the presence of a chaperone)  LABORATORY DATA:  I have reviewed the data as listed CMP Latest Ref Rng &  Units 10/20/2019 10/17/2018 10/12/2017  Glucose 70 - 99 mg/dL 102(H) 108(H) 99  BUN 6 - 23 mg/dL $Remove'21 17 14  'PHHkxUW$ Creatinine 0.40 - 1.20 mg/dL 0.56 0.67 0.56  Sodium 135 - 145 mEq/L 140 139 140  Potassium 3.5 - 5.1 mEq/L 3.9 4.6 4.3  Chloride 96 - 112 mEq/L 106 105 105  CO2 19 - 32 mEq/L $Remove'25 29 30  'zFRklSf$ Calcium 8.4 - 10.5 mg/dL 9.0 9.1 8.8    Total Protein 6.0 - 8.3 g/dL 6.3 6.9 6.5  Total Bilirubin 0.2 - 1.2 mg/dL 0.6 0.5 0.7  Alkaline Phos 39 - 117 U/L 58 54 64  AST 0 - 37 U/L $Remo'12 13 15  'jvZZl$ ALT 0 - 35 U/L $Remo'10 10 12    'uhRFK$ Lab Results  Component Value Date   WBC 7.6 07/04/2017   HGB 12.6 07/04/2017   HCT 38.9 07/04/2017   MCV 85.2 07/04/2017   PLT 267 07/04/2017   NEUTROABS 5.0 07/04/2017    ASSESSMENT & PLAN:  Ductal carcinoma in situ (DCIS) of right breast 07/24/2017 left lumpectomy: IDC grade 2, 1.5 cm, intermediate grade DCIS, anterior margin broadly positive, posterior margin focally positive, lymphovascular invasion present, 0/2 lymph nodes negative T1CN0 stage I a  Right lumpectomy: DCIS grade 2 Tis NX stage 0 adjuvant radiation therapy 08/29/17- 10/01/17  Recommendations: Adjuvant antiestrogen therapy with Letrozole 2.5 mg dailystartedFebruary 1, 2019  Letrozoletoxicities:Denies any hot flashes or myalgias.  Breast cancer surveillance: 1.Breast exam 07/02/2020: benign 2.Mammogram  06/28/2020: Benign breast density category C I renewed her prescription for letrozole.  RTC in1 year for follow-up    No orders of the defined types were placed in this encounter.  The patient has a good understanding of the overall plan. she agrees with it. she will call with any problems that may develop before the next visit here.  Total time spent: 20 mins including face to face time and time spent for planning, charting and coordination of care  Nicholas Lose, MD 07/02/2020  I, Cloyde Reams Dorshimer, am acting as scribe for Dr. Nicholas Lose.  I have reviewed the above documentation for accuracy and completeness, and I agree with the above.

## 2020-07-02 ENCOUNTER — Inpatient Hospital Stay: Payer: Medicare HMO | Attending: Hematology and Oncology | Admitting: Hematology and Oncology

## 2020-07-02 ENCOUNTER — Other Ambulatory Visit: Payer: Self-pay

## 2020-07-02 DIAGNOSIS — Z170 Estrogen receptor positive status [ER+]: Secondary | ICD-10-CM | POA: Diagnosis not present

## 2020-07-02 DIAGNOSIS — Z923 Personal history of irradiation: Secondary | ICD-10-CM | POA: Insufficient documentation

## 2020-07-02 DIAGNOSIS — D0512 Intraductal carcinoma in situ of left breast: Secondary | ICD-10-CM | POA: Diagnosis not present

## 2020-07-02 DIAGNOSIS — Z79811 Long term (current) use of aromatase inhibitors: Secondary | ICD-10-CM | POA: Diagnosis not present

## 2020-07-02 DIAGNOSIS — Z79899 Other long term (current) drug therapy: Secondary | ICD-10-CM | POA: Diagnosis not present

## 2020-07-02 DIAGNOSIS — D0511 Intraductal carcinoma in situ of right breast: Secondary | ICD-10-CM | POA: Diagnosis not present

## 2020-07-02 MED ORDER — ATORVASTATIN CALCIUM 40 MG PO TABS: 40.0000 mg | ORAL_TABLET | Freq: Every day | ORAL | 3 refills | Status: DC

## 2020-07-02 MED ORDER — LETROZOLE 2.5 MG PO TABS: 2.5000 mg | ORAL_TABLET | Freq: Every day | ORAL | 3 refills | Status: DC

## 2020-07-02 NOTE — Assessment & Plan Note (Signed)
07/24/2017 left lumpectomy: IDC grade 2, 1.5 cm, intermediate grade DCIS, anterior margin broadly positive, posterior margin focally positive, lymphovascular invasion present, 0/2 lymph nodes negative T1CN0 stage I a  Right lumpectomy: DCIS grade 2 Tis NX stage 0 adjuvant radiation therapy 08/29/17- 10/01/17  Recommendations: Adjuvant antiestrogen therapy with Letrozole 2.5 mg dailystartedFebruary 1, 2019  Letrozoletoxicities:Denies any hot flashes or myalgias.  Breast cancer surveillance: 1.Breast exam 07/02/2020: benign 2.Mammogram  06/28/2020  RTC in1 year for follow-up

## 2020-07-05 ENCOUNTER — Telehealth: Payer: Self-pay

## 2020-07-05 NOTE — Telephone Encounter (Signed)
She should continue atorvastatin and last labs were 09/2019

## 2020-07-05 NOTE — Telephone Encounter (Signed)
Melanie Schneider notified as instructed by telephone.  States understanding.

## 2020-07-05 NOTE — Telephone Encounter (Signed)
Vm from pt asking if she is to continue taking atorvastatin and if she has had recent blood work.  Says Dr. Lindi Adie is asking.  Pt call back # [phone].

## 2020-07-15 ENCOUNTER — Ambulatory Visit (INDEPENDENT_AMBULATORY_CARE_PROVIDER_SITE_OTHER): Payer: Medicare HMO

## 2020-07-15 ENCOUNTER — Other Ambulatory Visit: Payer: Self-pay

## 2020-07-15 DIAGNOSIS — Z23 Encounter for immunization: Secondary | ICD-10-CM

## 2020-07-20 ENCOUNTER — Encounter: Payer: Self-pay | Admitting: Hematology and Oncology

## 2020-10-26 DIAGNOSIS — Z0100 Encounter for examination of eyes and vision without abnormal findings: Secondary | ICD-10-CM | POA: Diagnosis not present

## 2021-01-04 ENCOUNTER — Ambulatory Visit (INDEPENDENT_AMBULATORY_CARE_PROVIDER_SITE_OTHER): Payer: Medicare HMO

## 2021-01-04 ENCOUNTER — Other Ambulatory Visit: Payer: Self-pay

## 2021-01-04 DIAGNOSIS — Z0000 Encounter for general adult medical examination without abnormal findings: Secondary | ICD-10-CM
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
## 2021-01-04 NOTE — Patient Instructions (Signed)
Melanie Schneider , Thank you for taking time to come for your Medicare Wellness Visit. I appreciate your ongoing commitment to your health goals. Please review the following plan we discussed and let me know if I can assist you in the future.   Screening recommendations/referrals: Colonoscopy: Up to date, completed 09/06/2015, due 08/2025 Mammogram: Up to date, completed 06/28/2020, due 06/2021 Bone Density: due, will discuss with provider at visit  Recommended yearly ophthalmology/optometry visit for glaucoma screening and checkup Recommended yearly dental visit for hygiene and checkup  Vaccinations: Influenza vaccine: Up to date, completed 07/15/2020, due 04/2021 Pneumococcal vaccine: Completed series Tdap vaccine: decline-insurance  Shingles vaccine: due, check with your insurance regarding coverage if interested    Covid-19:Completed series  Advanced directives: Advance directive discussed with you today. Even though you declined this today please call our office should you change your mind and we can give you the proper paperwork for you to fill out.  Conditions/risks identified: hypertension, hypercholesterolemia   Next appointment: Follow up in one year for your annual wellness visit    Preventive Care 26 Years and Older, Female Preventive care refers to lifestyle choices and visits with your health care provider that can promote health and wellness. What does preventive care include?  A yearly physical exam. This is also called an annual well check.  Dental exams once or twice a year.  Routine eye exams. Ask your health care provider how often you should have your eyes checked.  Personal lifestyle choices, including:  Daily care of your teeth and gums.  Regular physical activity.  Eating a healthy diet.  Avoiding tobacco and drug use.  Limiting alcohol use.  Practicing safe sex.  Taking low-dose aspirin every day.  Taking vitamin and mineral supplements as  recommended by your health care provider. What happens during an annual well check? The services and screenings done by your health care provider during your annual well check will depend on your age, overall health, lifestyle risk factors, and family history of disease. Counseling  Your health care provider may ask you questions about your:  Alcohol use.  Tobacco use.  Drug use.  Emotional well-being.  Home and relationship well-being.  Sexual activity.  Eating habits.  History of falls.  Memory and ability to understand (cognition).  Work and work Statistician.  Reproductive health. Screening  You may have the following tests or measurements:  Height, weight, and BMI.  Blood pressure.  Lipid and cholesterol levels. These may be checked every 5 years, or more frequently if you are over 58 years old.  Skin check.  Lung cancer screening. You may have this screening every year starting at age 60 if you have a 30-pack-year history of smoking and currently smoke or have quit within the past 15 years.  Fecal occult blood test (FOBT) of the stool. You may have this test every year starting at age 44.  Flexible sigmoidoscopy or colonoscopy. You may have a sigmoidoscopy every 5 years or a colonoscopy every 10 years starting at age 3.  Hepatitis C blood test.  Hepatitis B blood test.  Sexually transmitted disease (STD) testing.  Diabetes screening. This is done by checking your blood sugar (glucose) after you have not eaten for a while (fasting). You may have this done every 1-3 years.  Bone density scan. This is done to screen for osteoporosis. You may have this done starting at age 24.  Mammogram. This may be done every 1-2 years. Talk to your health care provider  about how often you should have regular mammograms. Talk with your health care provider about your test results, treatment options, and if necessary, the need for more tests. Vaccines  Your health care  provider may recommend certain vaccines, such as:  Influenza vaccine. This is recommended every year.  Tetanus, diphtheria, and acellular pertussis (Tdap, Td) vaccine. You may need a Td booster every 10 years.  Zoster vaccine. You may need this after age 74.  Pneumococcal 13-valent conjugate (PCV13) vaccine. One dose is recommended after age 40.  Pneumococcal polysaccharide (PPSV23) vaccine. One dose is recommended after age 30. Talk to your health care provider about which screenings and vaccines you need and how often you need them. This information is not intended to replace advice given to you by your health care provider. Make sure you discuss any questions you have with your health care provider. Document Released: 10/08/2015 Document Revised: 05/31/2016 Document Reviewed: 07/13/2015 Elsevier Interactive Patient Education  2017 [place] Prevention in the Home Falls can cause injuries. They can happen to people of all ages. There are many things you can do to make your home safe and to help prevent falls. What can I do on the outside of my home?  Regularly fix the edges of walkways and driveways and fix any cracks.  Remove anything that might make you trip as you walk through a door, such as a raised step or threshold.  Trim any bushes or trees on the path to your home.  Use bright outdoor lighting.  Clear any walking paths of anything that might make someone trip, such as rocks or tools.  Regularly check to see if handrails are loose or broken. Make sure that both sides of any steps have handrails.  Any raised decks and porches should have guardrails on the edges.  Have any leaves, snow, or ice cleared regularly.  Use sand or salt on walking paths during winter.  Clean up any spills in your garage right away. This includes oil or grease spills. What can I do in the bathroom?  Use night lights.  Install grab bars by the toilet and in the tub and shower. Do  not use towel bars as grab bars.  Use non-skid mats or decals in the tub or shower.  If you need to sit down in the shower, use a plastic, non-slip stool.  Keep the floor dry. Clean up any water that spills on the floor as soon as it happens.  Remove soap buildup in the tub or shower regularly.  Attach bath mats securely with double-sided non-slip rug tape.  Do not have throw rugs and other things on the floor that can make you trip. What can I do in the bedroom?  Use night lights.  Make sure that you have a light by your bed that is easy to reach.  Do not use any sheets or blankets that are too big for your bed. They should not hang down onto the floor.  Have a firm chair that has side arms. You can use this for support while you get dressed.  Do not have throw rugs and other things on the floor that can make you trip. What can I do in the kitchen?  Clean up any spills right away.  Avoid walking on wet floors.  Keep items that you use a lot in easy-to-reach places.  If you need to reach something above you, use a strong step stool that has a grab bar.  Keep electrical cords out of the way.  Do not use floor polish or wax that makes floors slippery. If you must use wax, use non-skid floor wax.  Do not have throw rugs and other things on the floor that can make you trip. What can I do with my stairs?  Do not leave any items on the stairs.  Make sure that there are handrails on both sides of the stairs and use them. Fix handrails that are broken or loose. Make sure that handrails are as long as the stairways.  Check any carpeting to make sure that it is firmly attached to the stairs. Fix any carpet that is loose or worn.  Avoid having throw rugs at the top or bottom of the stairs. If you do have throw rugs, attach them to the floor with carpet tape.  Make sure that you have a light switch at the top of the stairs and the bottom of the stairs. If you do not have them,  ask someone to add them for you. What else can I do to help prevent falls?  Wear shoes that:  Do not have high heels.  Have rubber bottoms.  Are comfortable and fit you well.  Are closed at the toe. Do not wear sandals.  If you use a stepladder:  Make sure that it is fully opened. Do not climb a closed stepladder.  Make sure that both sides of the stepladder are locked into place.  Ask someone to hold it for you, if possible.  Clearly mark and make sure that you can see:  Any grab bars or handrails.  First and last steps.  Where the edge of each step is.  Use tools that help you move around (mobility aids) if they are needed. These include:  Canes.  Walkers.  Scooters.  Crutches.  Turn on the lights when you go into a dark area. Replace any light bulbs as soon as they burn out.  Set up your furniture so you have a clear path. Avoid moving your furniture around.  If any of your floors are uneven, fix them.  If there are any pets around you, be aware of where they are.  Review your medicines with your doctor. Some medicines can make you feel dizzy. This can increase your chance of falling. Ask your doctor what other things that you can do to help prevent falls. This information is not intended to replace advice given to you by your health care provider. Make sure you discuss any questions you have with your health care provider. Document Released: 07/08/2009 Document Revised: 02/17/2016 Document Reviewed: 10/16/2014 Elsevier Interactive Patient Education  2017 [employer].

## 2021-01-04 NOTE — Progress Notes (Signed)
PCP notes:  Health Maintenance: Dexa- due   Abnormal Screenings: none   Patient concerns: Chronic constipation, not relieved with Miralax or suppositories, discuss GI referral  Some hearing loss issues  Needs living will papers   Nurse concerns: none   Next PCP appt.: none

## 2021-02-02 ENCOUNTER — Telehealth: Payer: Self-pay | Admitting: Family Medicine

## 2021-02-02 NOTE — Telephone Encounter (Signed)
LVM for pt to rtn my call to r/s appt with nha. Please r/s appt if pt calls the office. 

## 2021-02-07 ENCOUNTER — Other Ambulatory Visit: Payer: Self-pay | Admitting: Family Medicine

## 2021-02-08 ENCOUNTER — Telehealth: Payer: Self-pay | Admitting: Family Medicine

## 2021-02-08 DIAGNOSIS — R7309 Other abnormal glucose: Secondary | ICD-10-CM

## 2021-02-08 DIAGNOSIS — E7800 Pure hypercholesterolemia, unspecified: Secondary | ICD-10-CM

## 2021-02-08 DIAGNOSIS — E538 Deficiency of other specified B group vitamins: Secondary | ICD-10-CM

## 2021-02-08 NOTE — Telephone Encounter (Signed)
LVM for pt to rtn my call to r/s appt with nha on 02/18/21.

## 2021-02-08 NOTE — Telephone Encounter (Signed)
Pharmacy requests refill on: Atorvastatin 40 mg   LAST REFILL: 07/02/2020 (Q-90, R-3) LAST OV: 08/237/2021 NEXT OV: 02/25/2021 PHARMACY: [place], [place]

## 2021-02-08 NOTE — Telephone Encounter (Signed)
-----   Message from Cloyd Stagers, RT sent at 02/07/2021  1:52 PM EDT ----- Regarding: Lab Orders for Tuesday 5.31.2022 Please place lab orders for Tuesday 5.31.2022, office visit for physical on Friday 6.3.2022 Thank you, Dyke Maes RT(R)

## 2021-02-09 ENCOUNTER — Other Ambulatory Visit: Payer: Self-pay | Admitting: Family Medicine

## 2021-02-18 ENCOUNTER — Ambulatory Visit: Payer: Medicare HMO

## 2021-02-22 ENCOUNTER — Other Ambulatory Visit (INDEPENDENT_AMBULATORY_CARE_PROVIDER_SITE_OTHER): Payer: Medicare HMO

## 2021-02-22 ENCOUNTER — Other Ambulatory Visit: Payer: Self-pay

## 2021-02-22 DIAGNOSIS — R7309 Other abnormal glucose: Secondary | ICD-10-CM | POA: Diagnosis not present

## 2021-02-22 DIAGNOSIS — E7800 Pure hypercholesterolemia, unspecified: Secondary | ICD-10-CM

## 2021-02-22 DIAGNOSIS — E538 Deficiency of other specified B group vitamins: Secondary | ICD-10-CM | POA: Diagnosis not present

## 2021-02-22 LAB — COMPREHENSIVE METABOLIC PANEL
ALT: 10 U/L (ref 0–35)
AST: 11 U/L (ref 0–37)
Albumin: 3.9 g/dL (ref 3.5–5.2)
Alkaline Phosphatase: 58 U/L (ref 39–117)
BUN: 18 mg/dL (ref 6–23)
CO2: 27 mEq/L (ref 19–32)
Calcium: 9.4 mg/dL (ref 8.4–10.5)
Chloride: 109 mEq/L (ref 96–112)
Creatinine, Ser: 0.72 mg/dL (ref 0.40–1.20)
GFR: 82.16 mL/min (ref >60.00)
Glucose, Bld: 104 mg/dL — ABNORMAL HIGH (ref 70–99)
Potassium: 4.5 mEq/L (ref 3.5–5.1)
Sodium: 144 mEq/L (ref 135–145)
Total Bilirubin: 0.8 mg/dL (ref 0.2–1.2)
Total Protein: 6.6 g/dL (ref 6.0–8.3)

## 2021-02-22 LAB — LIPID PANEL
Cholesterol: 139 mg/dL (ref 0–200)
HDL: 44.5 mg/dL (ref >39.00)
LDL Cholesterol: 83 mg/dL (ref 0–99)
NonHDL: 94.07
Total CHOL/HDL Ratio: 3
Triglycerides: 56 mg/dL (ref 0.0–149.0)
VLDL: 11.2 mg/dL (ref 0.0–40.0)

## 2021-02-22 LAB — HEMOGLOBIN A1C: Hgb A1c MFr Bld: 5.8 % (ref 4.6–6.5)

## 2021-02-22 LAB — VITAMIN B12: Vitamin B-12: 109 pg/mL — ABNORMAL LOW (ref 211–911)

## 2021-02-22 NOTE — Progress Notes (Signed)
No critical labs need to be addressed urgently. We will discuss labs in detail at upcoming office visit.   

## 2021-02-25 ENCOUNTER — Other Ambulatory Visit: Payer: Self-pay | Admitting: Family Medicine

## 2021-02-25 ENCOUNTER — Encounter: Payer: Self-pay | Admitting: Family Medicine

## 2021-02-25 ENCOUNTER — Other Ambulatory Visit: Payer: Self-pay

## 2021-02-25 ENCOUNTER — Ambulatory Visit (INDEPENDENT_AMBULATORY_CARE_PROVIDER_SITE_OTHER): Payer: Medicare HMO | Admitting: Family Medicine

## 2021-02-25 VITALS — HR 56 | Temp 97.7°F | Ht 63.0 in | Wt 183.0 lb

## 2021-02-25 DIAGNOSIS — Z0000 Encounter for general adult medical examination without abnormal findings: Secondary | ICD-10-CM

## 2021-02-25 DIAGNOSIS — E7800 Pure hypercholesterolemia, unspecified: Secondary | ICD-10-CM | POA: Diagnosis not present

## 2021-02-25 DIAGNOSIS — R7309 Other abnormal glucose: Secondary | ICD-10-CM

## 2021-02-25 DIAGNOSIS — K5909 Other constipation: Secondary | ICD-10-CM

## 2021-02-25 DIAGNOSIS — I700 Atherosclerosis of aorta: Secondary | ICD-10-CM

## 2021-02-25 DIAGNOSIS — I5032 Chronic diastolic (congestive) heart failure: Secondary | ICD-10-CM

## 2021-02-25 DIAGNOSIS — I10 Essential (primary) hypertension: Secondary | ICD-10-CM | POA: Diagnosis not present

## 2021-02-25 MED ORDER — ATORVASTATIN CALCIUM 40 MG PO TABS: 40.0000 mg | ORAL_TABLET | Freq: Every day | ORAL | 3 refills | Status: DC

## 2021-02-25 NOTE — Patient Instructions (Addendum)
We will work on setting GI referral for constipation.  Try a trial of Miralax 17 gm twice daily.  Continue water and fiber in diet.  Work on low [employer]. Start 1000 mg daily B12 vitamin.

## 2021-02-25 NOTE — Progress Notes (Signed)
Patient ID: Melanie Schneider, female    DOB: 26-Jun-1946, 75 y.o.   MRN: [id number]  This visit was conducted in person.  Pulse (!) 56   Temp 97.7 F (36.5 C) (Temporal)   Ht 5\' 3"  (1.6 m)   Wt 183 lb (83 kg)   SpO2 98%   BMI 32.42 kg/m    CC:  Chief Complaint  Patient presents with   Annual Exam    Subjective:   HPI: Melanie Schneider is a 75 y.o. female presenting on 02/25/2021 for Annual Exam   The patient presents for  complete physical and review of chronic health problems. He/She also has the following acute concerns today:  The patient saw a LPN or RN for medicare wellness visit.  Prevention and wellness was reviewed in detail. Note reviewed and important notes copied below.   Health Maintenance: Dexa- due   Abnormal Screenings: none   02/25/21 Chronic constipation:  BMs every 8 days, blood seen after straining not relieved with Miralax or suppositories, discuss GI referral   Not interested in amitiza after diarrhea with linzess. She is currently using fiber and trying to drink 3 glasses of water a day. Metamucil   Colonoscopy: 2016  Aortic atherosclerosis.Marland Kitchen LDL almost at goal < 70.. on statin   Chronic diastolic heart failure:  No peripheral swelling.    Hypertension:  At goal on no medication  BP Readings from Last 3 Encounters:  07/02/20 126/71  05/21/20 118/60  10/28/19 (!) 122/58  Using medication without problems or lightheadedness: none Chest pain with exertion: none Edema: none Short of breath:none Average home BPs: Other issues: Peripheral neuropathy: tolerable control.  Prediabetes   Lab Results  Component Value Date   HGBA1C 5.8 02/22/2021     Elevated Cholesterol: LDL improved and at goal on atorvastatin 40 mg daily. Lab Results  Component Value Date   CHOL 139 02/22/2021   HDL 44.50 02/22/2021   LDLCALC 83 02/22/2021   LDLDIRECT 119.9 10/07/2013   TRIG 56.0 02/22/2021   CHOLHDL 3 02/22/2021  Using medications  without problems: Muscle aches:  Diet compliance: moderate Exercise: limit Other complaints:  B12 low... not on supplement.  Relevant past medical, surgical, family and social history reviewed and updated as indicated. Interim medical history since our last visit reviewed. Allergies and medications reviewed and updated. Outpatient Medications Prior to Visit  Medication Sig Dispense Refill   albuterol (PROVENTIL HFA;VENTOLIN HFA) 108 (90 Base) MCG/ACT inhaler Inhale 2 puffs into the lungs every 6 (six) hours as needed for wheezing or shortness of breath (with aerochamber). 1 Inhaler 3   atorvastatin (LIPITOR) 40 MG tablet Take 1 tablet (40 mg total) by mouth daily. 90 tablet 3   letrozole (FEMARA) 2.5 MG tablet Take 1 tablet (2.5 mg total) by mouth daily. 90 tablet 3   MELATONIN ER PO Take by mouth.     Facility-Administered Medications Prior to Visit  Medication Dose Route Frequency Provider Last Rate Last Admin   SONAFINE emulsion 1 application  1 application Topical BID Hayden Pedro, PA-C   1 application at 74/25/95 1521     Per HPI unless specifically indicated in ROS section below Review of Systems  Constitutional:  Negative for fatigue and fever.  HENT:  Negative for congestion and ear pain.   Eyes:  Negative for pain.  Respiratory:  Negative for cough, chest tightness and shortness of breath.   Cardiovascular:  Negative for chest pain, palpitations and leg swelling.  Gastrointestinal:  Negative for abdominal pain.  Genitourinary:  Negative for dysuria and vaginal bleeding.  Musculoskeletal:  Negative for back pain.  Neurological:  Negative for syncope, light-headedness and headaches.  Psychiatric/Behavioral:  Negative for dysphoric mood.   Objective:  Pulse (!) 56   Temp 97.7 F (36.5 C) (Temporal)   Ht 5\' 3"  (1.6 m)   Wt 183 lb (83 kg)   SpO2 98%   BMI 32.42 kg/m   Wt Readings from Last 3 Encounters:  02/25/21 183 lb (83 kg)  07/02/20 182 lb 9.6 oz (82.8  kg)  05/21/20 184 lb (83.5 kg)      Physical Exam Constitutional:      General: She is not in acute distress.    Appearance: Normal appearance. She is well-developed. She is obese. She is not ill-appearing or toxic-appearing.  HENT:     Head: Normocephalic.     Right Ear: Hearing, tympanic membrane, ear canal and external ear normal.     Left Ear: Hearing, tympanic membrane, ear canal and external ear normal.     Nose: Nose normal.  Eyes:     General: Lids are normal. Lids are everted, no foreign bodies appreciated.     Conjunctiva/sclera: Conjunctivae normal.     Pupils: Pupils are equal, round, and reactive to light.  Neck:     Thyroid: No thyroid mass or thyromegaly.     Vascular: No carotid bruit.     Trachea: Trachea normal.  Cardiovascular:     Rate and Rhythm: Normal rate and regular rhythm.     Heart sounds: Normal heart sounds, S1 normal and S2 normal. No murmur heard.   No gallop.  Pulmonary:     Effort: Pulmonary effort is normal. No respiratory distress.     Breath sounds: Normal breath sounds. No wheezing, rhonchi or rales.  Abdominal:     General: Bowel sounds are normal. There is no distension or abdominal bruit.     Palpations: Abdomen is soft. There is no fluid wave or mass.     Tenderness: There is no abdominal tenderness. There is no guarding or rebound.     Hernia: No hernia is present.  Musculoskeletal:     Cervical back: Normal range of motion and neck supple.  Lymphadenopathy:     Cervical: No cervical adenopathy.  Skin:    General: Skin is warm and dry.     Findings: No rash.  Neurological:     Mental Status: She is alert.     Cranial Nerves: No cranial nerve deficit.     Sensory: No sensory deficit.  Psychiatric:        Mood and Affect: Mood is not anxious or depressed.        Speech: Speech normal.        Behavior: Behavior normal. Behavior is cooperative.        Judgment: Judgment normal.      Results for orders placed or performed in  visit on 02/22/21  Vitamin B12  Result Value Ref Range   Vitamin B-12 109 (L) 211 - 911 pg/mL  Comprehensive metabolic panel  Result Value Ref Range   Sodium 144 135 - 145 mEq/L   Potassium 4.5 3.5 - 5.1 mEq/L   Chloride 109 96 - 112 mEq/L   CO2 27 19 - 32 mEq/L   Glucose, Bld 104 (H) 70 - 99 mg/dL   BUN 18 6 - 23 mg/dL   Creatinine, Ser 0.72 0.40 - 1.20 mg/dL   Total Bilirubin  0.8 0.2 - 1.2 mg/dL   Alkaline Phosphatase 58 39 - 117 U/L   AST 11 0 - 37 U/L   ALT 10 0 - 35 U/L   Total Protein 6.6 6.0 - 8.3 g/dL   Albumin 3.9 3.5 - 5.2 g/dL   GFR 82.16 >60.00 mL/min   Calcium 9.4 8.4 - 10.5 mg/dL  Lipid panel  Result Value Ref Range   Cholesterol 139 0 - 200 mg/dL   Triglycerides 56.0 0.0 - 149.0 mg/dL   HDL 44.50 >39.00 mg/dL   VLDL 11.2 0.0 - 40.0 mg/dL   LDL Cholesterol 83 0 - 99 mg/dL   Total CHOL/HDL Ratio 3    NonHDL 94.07   Hemoglobin A1c  Result Value Ref Range   Hgb A1c MFr Bld 5.8 4.6 - 6.5 %    This visit occurred during the SARS-CoV-2 public health emergency.  Safety protocols were in place, including screening questions prior to the visit, additional usage of staff PPE, and extensive cleaning of exam room while observing appropriate contact time as indicated for disinfecting solutions.   COVID 19 screen:  No recent travel or known exposure to COVID19 The patient denies respiratory symptoms of COVID 19 at this time. The importance of social distancing was discussed today.   Assessment and Plan The patient's preventative maintenance and recommended screening tests for an annual wellness exam were reviewed in full today. Brought up to date unless services declined.  Counselled on the importance of diet, exercise, and its role in overall health and mortality. The patient's FH and SH was reviewed, including their home life, tobacco status, and drug and alcohol status.   Vaccines: uptodate except shingles. COVID x4 Pap/DVE: not indicated, s/p TAH, sister with  ovarian cancer Mammo: personal hx of  breast cancer followed by ONC 06/2020 Bone Density: 2019, osteopenia.. repeat in 2024 Colon:  08/2015, plan repeat in 10 years. Smoking Status: former smoker, > 40 years in lung cancer screen protocol. ETOH/ drug use: No ETOH in several years/ occ marijuana use  Hep C:  done   Problem List Items Addressed This Visit     Aortic atherosclerosis ([place])   Relevant Medications   atorvastatin (LIPITOR) 40 MG tablet   Chronic constipation    Not interested in amitiza after diarrhea with linzess. She is currently using fiber and trying to drink 3 glasses of water a day. Metamucil      Relevant Orders   Ambulatory referral to Gastroenterology   Chronic diastolic heart failure ([place])    No peripheral swelling in office today.      Relevant Medications   atorvastatin (LIPITOR) 40 MG tablet   HTN (hypertension)    Stable, chronic.   On no medication.        Relevant Medications   atorvastatin (LIPITOR) 40 MG tablet   PREDIABETES    Improving control.  Encouraged exercise, weight loss, healthy eating habits.       PURE HYPERCHOLESTEROLEMIA    Stable, chronic.  Continue current medication.   LDL improved and at goal on atorvastatin 40 mg daily.      Relevant Medications   atorvastatin (LIPITOR) 40 MG tablet   Other Visit Diagnoses     Routine general medical examination at a health care facility    -  Primary        Eliezer Lofts, MD

## 2021-02-28 ENCOUNTER — Other Ambulatory Visit: Payer: Self-pay | Admitting: Family Medicine

## 2021-02-28 MED ORDER — ATORVASTATIN CALCIUM 40 MG PO TABS: 40.0000 mg | ORAL_TABLET | Freq: Every day | ORAL | 3 refills | Status: DC

## 2021-04-01 ENCOUNTER — Ambulatory Visit: Payer: Medicare HMO

## 2021-04-12 ENCOUNTER — Other Ambulatory Visit: Payer: Self-pay

## 2021-04-12 ENCOUNTER — Encounter: Payer: Self-pay | Admitting: Gastroenterology

## 2021-04-12 ENCOUNTER — Ambulatory Visit: Payer: Medicare HMO | Admitting: Gastroenterology

## 2021-04-12 VITALS — BP 123/72 | HR 65 | Temp 97.6°F | Ht 63.0 in | Wt 182.0 lb

## 2021-04-12 DIAGNOSIS — K5904 Chronic idiopathic constipation: Secondary | ICD-10-CM | POA: Diagnosis not present

## 2021-04-12 DIAGNOSIS — Z86010 Personal history of colonic polyps: Secondary | ICD-10-CM

## 2021-04-12 DIAGNOSIS — E538 Deficiency of other specified B group vitamins: Secondary | ICD-10-CM | POA: Diagnosis not present

## 2021-04-12 DIAGNOSIS — D126 Benign neoplasm of colon, unspecified: Secondary | ICD-10-CM

## 2021-04-12 MED ORDER — NA SULFATE-K SULFATE-MG SULF 17.5-3.13-1.6 GM/177ML PO SOLN: 354.0000 mL | Freq: Once | ORAL | 0 refills | Status: AC

## 2021-04-12 MED ORDER — MAGNESIUM CITRATE PO SOLN: 1.0000 | Freq: Once | ORAL | 0 refills | Status: AC

## 2021-04-12 NOTE — Progress Notes (Signed)
Cephas Darby, MD [address]  [place]  [place], [place] [address]  Main: [phone]  Fax: [phone]    Gastroenterology Consultation  Referring Provider:     Jinny Sanders, MD Primary Care Physician:  Jinny Sanders, MD Primary Gastroenterologist:  Dr. Cephas Darby Reason for Consultation:     Chronic constipation        HPI:   Melanie Schneider is a 75 y.o. female referred by Dr. Jinny Sanders, MD  for consultation & management of chronic constipation.  Patient reports that she has been experiencing approximately 2 years of severe constipation.  She has hard bowel movement every 8 days.  She states she has been taking Metamucil and MiraLAX and not effective.  She also felt that constipation has started when she became less active.  Labs revealed normal CMP, HbA1c, hemoglobin, TSH.  She does have chronic B12 deficiency dating back to 2013 for which she is currently taking oral B12 supplements.  NSAIDs: None  Antiplts/Anticoagulants/Anti thrombotics: None  GI Procedures: Colonoscopy 09/06/2015 10 mm polyp was removed with hot snare and transverse colon Sigmoid and descending colon diverticulosis Tubular adenoma of the colon on pathology  Past Medical History:  Diagnosis Date   Breast cancer, left ([place]) 06/2017   COPD (chronic obstructive pulmonary disease) ([facility])    states coughs frequently; occasional SOB with ADLs; no home O2   Ductal carcinoma in situ (DCIS) of right breast 06/2017   Dysthymia    Family history of adverse reaction to anesthesia    states father had reaction to anesthesia and had to be resuscitated   History of CVA (cerebrovascular accident) 2014   Hyperlipidemia    Morbid obesity due to excess calories ([place]) complicated by hyperlipidemia 12/13/2006   Qualifier: Diagnosis of  By: Diona Browner MD, Amy     Peripheral neuropathy    lower legs - walks with a straight cane    Past Surgical History:  Procedure Laterality Date   BREAST  LUMPECTOMY WITH NEEDLE LOCALIZATION AND AXILLARY SENTINEL LYMPH NODE BX Bilateral 07/24/2017   Procedure: BILATERAL BREAST LUMPECTOMY WITH BILATERAL NEEDLE LOCALIZATION AND LEFT AXILLARY SENTINEL LYMPH NODE BX;  Surgeon: Erroll Luna, MD;  Location: [facility];  Service: General;  Laterality: Bilateral;   CARDIAC CATHETERIZATION  12/26/2001   normal   ESOPHAGOGASTRODUODENOSCOPY  02/2005   TONSILLECTOMY     as a child   VAGINAL HYSTERECTOMY      Current Outpatient Medications:    albuterol (PROVENTIL HFA;VENTOLIN HFA) 108 (90 Base) MCG/ACT inhaler, Inhale 2 puffs into the lungs every 6 (six) hours as needed for wheezing or shortness of breath (with aerochamber)., Disp: 1 Inhaler, Rfl: 3   atorvastatin (LIPITOR) 40 MG tablet, Take 1 tablet (40 mg total) by mouth daily., Disp: 90 tablet, Rfl: 3   letrozole (FEMARA) 2.5 MG tablet, Take 1 tablet (2.5 mg total) by mouth daily., Disp: 90 tablet, Rfl: 3   magnesium citrate SOLN, Take 296 mLs (1 Bottle total) by mouth once for 1 dose., Disp: 195 mL, Rfl: 0   MELATONIN ER PO, Take by mouth., Disp: , Rfl:    Na Sulfate-K Sulfate-Mg Sulf 17.5-3.13-1.6 GM/177ML SOLN, Take 354 mLs by mouth once for 1 dose., Disp: 354 mL, Rfl: 0 No current facility-administered medications for this visit.  Facility-Administered Medications Ordered in Other Visits:    SONAFINE emulsion 1 application, 1 application, Topical, BID, [place] Pedro, PA-C, 1 application at 85/88/50 1521  Family History  Problem Relation Age of Onset   Cancer Father    Anesthesia problems Father        had unknown (by pt.) reaction to anesthesia, had to be resuscitated   Heart disease Mother    Breast cancer Mother    Alcohol abuse Other    Breast cancer Other        1st degree relative <50: mother and aunt   Asthma Daughter    Emphysema Paternal Grandfather      Social History   Tobacco Use   Smoking status: Former    Packs/day: 0.50    Years: 51.00     Pack years: 25.50    Types: Cigarettes    Quit date: 09/24/2013    Years since quitting: 7.5   Smokeless tobacco: Never  Vaping Use   Vaping Use: Never used  Substance Use Topics   Alcohol use: No   Drug use: No    Allergies as of 04/12/2021 - Review Complete 04/12/2021  Allergen Reaction Noted   Iodine Other (See Comments)    Other Hives 07/17/2017   Povidone-iodine Other (See Comments)    Adhesive [tape] Other (See Comments) 07/17/2017   Latex Other (See Comments) 06/24/2013    Review of Systems:    All systems reviewed and negative except where noted in HPI.   Physical Exam:  BP 123/72 (BP Location: Left Arm, Patient Position: Sitting, Cuff Size: Normal)   Pulse 65   Temp 97.6 F (36.4 C) (Oral)   Ht 5\' 3"  (1.6 m)   Wt 182 lb (82.6 kg)   BMI 32.24 kg/m  No LMP recorded. Patient has had a hysterectomy.  General:   Alert,  Well-developed, well-nourished, pleasant and cooperative in NAD Head:  Normocephalic and atraumatic. Eyes:  Sclera clear, no icterus.   Conjunctiva pink. Ears:  Normal auditory acuity. Nose:  No deformity, discharge, or lesions. Mouth:  No deformity or lesions,oropharynx pink & moist. Neck:  Supple; no masses or thyromegaly. Lungs:  Respirations even and unlabored.  Clear throughout to auscultation.   No wheezes, crackles, or rhonchi. No acute distress. Heart:  Regular rate and rhythm; no murmurs, clicks, rubs, or gallops. Abdomen:  Normal bowel sounds. Soft, non-tender and non-distended without masses, hepatosplenomegaly or hernias noted.  No guarding or rebound tenderness.   Rectal: Not performed Msk:  Symmetrical without gross deformities. Good, equal movement & strength bilaterally. Pulses:  Normal pulses noted. Extremities:  No clubbing or edema.  No cyanosis. Neurologic:  Alert and oriented x3;  grossly normal neurologically. Psych:  Alert and cooperative. Normal mood and affect.  Imaging Studies: No abdominal imaging  Assessment  and Plan:   Melanie Schneider is a 75 y.o. female with history of breast cancer on letrozole is seen in consultation for chronic idiopathic constipation, chronic B12 deficiency  Chronic idiopathic constipation Discussed about high-fiber diet, information provided Adequate intake of water Patient has tried MiraLAX and Metamucil in the past Trial of Linzess 145 MCG daily, samples provided  Chronic B12 deficiency without anemia Recommend to check B12 and folate levels, iron panel  Personal history of tubular adenoma of the colon Recommend surveillance colonoscopy with 2-day prep   Follow up in 3 months   Cephas Darby, MD

## 2021-04-12 NOTE — Patient Instructions (Signed)
Gave Linzess 145 samples  High-Fiber Eating Plan Fiber, also called dietary fiber, is a type of carbohydrate. It is found foods such as fruits, vegetables, whole grains, and beans. A high-fiber diet can have many health benefits. Your health care provider may recommend a high-fiber diet to help: Prevent constipation. Fiber can make your bowel movements more regular. Lower your cholesterol. Relieve the following conditions: Inflammation of veins in the anus (hemorrhoids). Inflammation of specific areas of the digestive tract (uncomplicated diverticulosis). A problem of the large intestine, also called the colon, that sometimes causes pain and diarrhea (irritable bowel syndrome, or IBS). Prevent overeating as part of a weight-loss plan. Prevent heart disease, type 2 diabetes, and certain cancers. What are tips for following this plan? Reading food labels  Check the nutrition facts label on food products for the amount of dietary fiber. Choose foods that have 5 grams of fiber or more per serving. The goals for recommended daily fiber intake include: Men (age 89 or younger): 34-38 g. Men (over age 102): 28-34 g. Women (age 72 or younger): 25-28 g. Women (over age 22): 22-25 g. Your daily fiber goal is _____________ g. Shopping Choose whole fruits and vegetables instead of processed forms, such as apple juice or applesauce. Choose a wide variety of high-fiber foods such as avocados, lentils, oats, and kidney beans. Read the nutrition facts label of the foods you choose. Be aware of foods with added fiber. These foods often have high sugar and sodium amounts per serving. Cooking Use whole-grain flour for baking and cooking. Cook with brown rice instead of white rice. Meal planning Start the day with a breakfast that is high in fiber, such as a cereal that contains 5 g of fiber or more per serving. Eat breads and cereals that are made with whole-grain flour instead of refined flour or white  flour. Eat brown rice, bulgur wheat, or millet instead of white rice. Use beans in place of meat in soups, salads, and pasta dishes. Be sure that half of the grains you eat each day are whole grains. General information You can get the recommended daily intake of dietary fiber by: Eating a variety of fruits, vegetables, grains, nuts, and beans. Taking a fiber supplement if you are not able to take in enough fiber in your diet. It is better to get fiber through food than from a supplement. Gradually increase how much fiber you consume. If you increase your intake of dietary fiber too quickly, you may have bloating, cramping, or gas. Drink plenty of water to help you digest fiber. Choose high-fiber snacks, such as berries, raw vegetables, nuts, and popcorn. What foods should I eat? Fruits Berries. Pears. Apples. Oranges. Avocado. Prunes and raisins. Dried figs. Vegetables Sweet potatoes. Spinach. Kale. Artichokes. Cabbage. Broccoli. Cauliflower.Green peas. Carrots. Squash. Grains Whole-grain breads. Multigrain cereal. Oats and oatmeal. Brown rice. Barley.Bulgur wheat. [employer]. Quinoa. Bran muffins. Popcorn. Rye wafer crackers. Meats and other proteins Navy beans, kidney beans, and pinto beans. Soybeans. Split peas. Lentils. Nutsand seeds. Dairy Fiber-fortified yogurt. Beverages Fiber-fortified soy milk. Fiber-fortified orange juice. Other foods Fiber bars. The items listed above may not be a complete list of recommended foods and beverages. Contact a dietitian for more information. What foods should I avoid? Fruits Fruit juice. Cooked, strained fruit. Vegetables Fried potatoes. Canned vegetables. Well-cooked vegetables. Grains White bread. Pasta made with refined flour. White rice. Meats and other proteins Fatty cuts of meat. Fried chicken or fried fish. Dairy Milk. Yogurt. Cream cheese. Sour cream.  Fats and oils Butters. Beverages Soft drinks. Other foods Cakes and  pastries. The items listed above may not be a complete list of foods and beverages to avoid. Talk with your dietitian about what choices are best for you. Summary Fiber is a type of carbohydrate. It is found in foods such as fruits, vegetables, whole grains, and beans. A high-fiber diet has many benefits. It can help to prevent constipation, lower blood cholesterol, aid weight loss, and reduce your risk of heart disease, diabetes, and certain cancers. Increase your intake of fiber gradually. Increasing fiber too quickly may cause cramping, bloating, and gas. Drink plenty of water while you increase the amount of fiber you consume. The best sources of fiber include whole fruits and vegetables, whole grains, nuts, seeds, and beans. This information is not intended to replace advice given to you by your health care provider. Make sure you discuss any questions you have with your healthcare provider. Document Revised: 01/15/2020 Document Reviewed: 01/15/2020 Elsevier Patient Education  2022 [employer].

## 2021-04-13 ENCOUNTER — Telehealth: Payer: Self-pay

## 2021-04-13 LAB — B12 AND FOLATE PANEL
Folate: 7.1 ng/mL (ref >3.0)
Vitamin B-12: 259 pg/mL (ref 232–1245)

## 2021-04-13 LAB — IRON,TIBC AND FERRITIN PANEL
Ferritin: 134 ng/mL (ref 15–150)
Iron Saturation: 34 % (ref 15–55)
Iron: 84 ug/dL (ref 27–139)
Total Iron Binding Capacity: 246 ug/dL — ABNORMAL LOW (ref 250–450)
UIBC: 162 ug/dL (ref 118–369)

## 2021-04-13 NOTE — Telephone Encounter (Signed)
-----   Message from [name], MD sent at 04/13/2021 10:10 AM EDT ----- Please inform patient that her iron panel, B12 and folate levels came back normal.  She should continue taking B12 daily for at least another month  Melanie Schneider

## 2021-04-13 NOTE — Telephone Encounter (Signed)
Patient verbalized understanding of results  

## 2021-04-18 ENCOUNTER — Other Ambulatory Visit: Payer: Self-pay | Admitting: Family Medicine

## 2021-04-18 ENCOUNTER — Telehealth: Payer: Self-pay | Admitting: Gastroenterology

## 2021-04-18 NOTE — Telephone Encounter (Signed)
Linzess 145 samples, need a refill  30 day  [facility] on [address]  Bowel prep needs to be called in.

## 2021-04-18 NOTE — Telephone Encounter (Signed)
Bowel prep was sent to the pharmacy on 04/12/2021. Can we call in Linzess 145 to the pharmacy

## 2021-04-19 MED ORDER — LINACLOTIDE 145 MCG PO CAPS: 145.0000 ug | ORAL_CAPSULE | Freq: Every day | ORAL | 2 refills | Status: DC

## 2021-04-19 NOTE — Telephone Encounter (Signed)
Goahead with Linzess  RV

## 2021-04-19 NOTE — Telephone Encounter (Signed)
Sent medication to the pharmacy  

## 2021-04-19 NOTE — Addendum Note (Signed)
Addended by: Ulyess Blossom L on: 04/19/2021 08:33 AM   Modules accepted: Orders

## 2021-05-09 ENCOUNTER — Encounter
Admission: RE | Disposition: A | Discharge status: Home / Self Care | Payer: Self-pay | Source: Home / Self Care | Attending: Gastroenterology

## 2021-05-09 ENCOUNTER — Ambulatory Visit
Admission: RE | Admit: 2021-05-09 | Discharge: 2021-05-09 | Disposition: A | Discharge status: Home / Self Care | Payer: Medicare HMO | Attending: Gastroenterology | Admitting: Gastroenterology

## 2021-05-09 ENCOUNTER — Ambulatory Visit: Payer: Medicare HMO | Admitting: Anesthesiology

## 2021-05-09 DIAGNOSIS — Z91040 Latex allergy status: Secondary | ICD-10-CM | POA: Insufficient documentation

## 2021-05-09 DIAGNOSIS — Z860100 Personal history of colon polyps, unspecified: Secondary | ICD-10-CM

## 2021-05-09 DIAGNOSIS — Z1211 Encounter for screening for malignant neoplasm of colon: Secondary | ICD-10-CM | POA: Insufficient documentation

## 2021-05-09 DIAGNOSIS — K5730 Diverticulosis of large intestine without perforation or abscess without bleeding: Secondary | ICD-10-CM | POA: Insufficient documentation

## 2021-05-09 DIAGNOSIS — C50912 Malignant neoplasm of unspecified site of left female breast: Secondary | ICD-10-CM | POA: Diagnosis not present

## 2021-05-09 DIAGNOSIS — Z79899 Other long term (current) drug therapy: Secondary | ICD-10-CM | POA: Diagnosis not present

## 2021-05-09 DIAGNOSIS — Z79811 Long term (current) use of aromatase inhibitors: Secondary | ICD-10-CM | POA: Diagnosis not present

## 2021-05-09 DIAGNOSIS — E7800 Pure hypercholesterolemia, unspecified: Secondary | ICD-10-CM | POA: Diagnosis not present

## 2021-05-09 DIAGNOSIS — K644 Residual hemorrhoidal skin tags: Secondary | ICD-10-CM | POA: Diagnosis not present

## 2021-05-09 DIAGNOSIS — Z888 Allergy status to other drugs, medicaments and biological substances status: Secondary | ICD-10-CM | POA: Diagnosis not present

## 2021-05-09 HISTORY — PX: COLONOSCOPY WITH PROPOFOL: SHX5780

## 2021-05-09 SURGERY — COLONOSCOPY WITH PROPOFOL
Anesthesia: General

## 2021-05-09 MED ORDER — PROPOFOL 500 MG/50ML IV EMUL
INTRAVENOUS | Status: DC | PRN
  Administered 2021-05-09: 150 ug/kg/min via INTRAVENOUS

## 2021-05-09 MED ORDER — LUBIPROSTONE 24 MCG PO CAPS: 24.0000 ug | ORAL_CAPSULE | Freq: Two times a day (BID) | ORAL | 3 refills | Status: DC

## 2021-05-09 MED ORDER — PROPOFOL 10 MG/ML IV BOLUS
INTRAVENOUS | Status: DC | PRN
  Administered 2021-05-09: 70 mg via INTRAVENOUS
  Administered 2021-05-09: 20 mg via INTRAVENOUS

## 2021-05-09 MED ORDER — LIDOCAINE HCL (PF) 1 % IJ SOLN
INTRAMUSCULAR | Status: AC
  Filled 2021-05-09: qty 2

## 2021-05-09 MED ORDER — MIDAZOLAM HCL 2 MG/2ML IJ SOLN
INTRAMUSCULAR | Status: AC
  Filled 2021-05-09: qty 2

## 2021-05-09 MED ORDER — SODIUM CHLORIDE 0.9 % IV SOLN
INTRAVENOUS | Status: DC
  Administered 2021-05-09: 1000 mL via INTRAVENOUS

## 2021-05-09 MED ORDER — LIDOCAINE HCL (CARDIAC) PF 100 MG/5ML IV SOSY
PREFILLED_SYRINGE | INTRAVENOUS | Status: DC | PRN
  Administered 2021-05-09: 50 mg via INTRAVENOUS

## 2021-05-09 MED ORDER — PROPOFOL 500 MG/50ML IV EMUL
INTRAVENOUS | Status: AC
  Filled 2021-05-09: qty 50

## 2021-05-09 MED ORDER — LIDOCAINE HCL (PF) 2 % IJ SOLN
INTRAMUSCULAR | Status: AC
  Filled 2021-05-09: qty 5

## 2021-05-09 NOTE — Op Note (Signed)
[facility] Patient Name: Melanie Schneider Procedure Date: 05/09/2021 9:36 AM MRN: [id number] Account #: [id number] Date of Birth: Jan 25, 1946 Admit Type: Outpatient Age: 75 Room: [facility] ENDO ROOM 1 Gender: Female Note Status: Finalized Procedure:             Colonoscopy Indications:           High risk colon cancer surveillance: Personal history                         of colonic polyps Providers:             [name] MD, MD Referring MD:          Jinny Sanders MD, MD (Referring MD) Medicines:             General Anesthesia Complications:         No immediate complications. Estimated blood loss: None. Procedure:             Pre-Anesthesia Assessment:                        - Prior to the procedure, a History and Physical was                         performed, and patient medications and allergies were                         reviewed. The patient is competent. The risks and                         benefits of the procedure and the sedation options and                         risks were discussed with the patient. All questions                         were answered and informed consent was obtained.                         Patient identification and proposed procedure were                         verified by the physician, the nurse, the                         anesthesiologist, the anesthetist and the technician                         in the pre-procedure area in the procedure room in the                         endoscopy suite. Mental Status Examination: alert and                         oriented. Airway Examination: normal oropharyngeal                         airway and neck mobility. Respiratory Examination:  clear to auscultation. CV Examination: normal.                         Prophylactic Antibiotics: The patient does not require                         prophylactic antibiotics. Prior Anticoagulants: The                          patient has taken no previous anticoagulant or                         antiplatelet agents. ASA Grade Assessment: III - A                         patient with severe systemic disease. After reviewing                         the risks and benefits, the patient was deemed in                         satisfactory condition to undergo the procedure. The                         anesthesia plan was to use general anesthesia.                         Immediately prior to administration of medications,                         the patient was re-assessed for adequacy to receive                         sedatives. The heart rate, respiratory rate, oxygen                         saturations, blood pressure, adequacy of pulmonary                         ventilation, and response to care were monitored                         throughout the procedure. The physical status of the                         patient was re-assessed after the procedure.                        After obtaining informed consent, the colonoscope was                         passed under direct vision. Throughout the procedure,                         the patient's blood pressure, pulse, and oxygen                         saturations were monitored continuously. The  Colonoscope was introduced through the anus and                         advanced to the the cecum, identified by appendiceal                         orifice and ileocecal valve. The colonoscopy was                         unusually difficult due to multiple diverticula in the                         colon, poor bowel prep and significant looping.                         Successful completion of the procedure was aided by                         applying abdominal pressure. The patient tolerated the                         procedure well. The quality of the bowel preparation                         was poor. Findings:      The  perianal and digital rectal examinations were normal. Pertinent       negatives include normal sphincter tone and no palpable rectal lesions.      Multiple diverticula were found in the entire colon.      Non-bleeding external hemorrhoids were found during retroflexion. The       hemorrhoids were small.      Extensive amounts of semi-liquid semi-solid stool was found in the       entire colon, precluding visualization. Impression:            - Preparation of the colon was poor.                        - Diverticulosis in the entire examined colon.                        - Non-bleeding external hemorrhoids.                        - Stool in the entire examined colon.                        - No specimens collected. Recommendation:        - Discharge patient to home (with escort).                        - Resume previous diet today.                        - Continue present medications.                        - Repeat colonoscopy tomorrow because the bowel  preparation was poor with repeat bowel prep and clear                         liquid diet today if patient is agreeable, otherwise                         in 6 months to 1 year with 2 day prep. Procedure Code(s):     --- Professional ---                        [id number], Colorectal cancer screening; colonoscopy on                         individual at high risk Diagnosis Code(s):     --- Professional ---                        K64.4, Residual hemorrhoidal skin tags                        K57.30, Diverticulosis of large intestine without                         perforation or abscess without bleeding                        Z86.010, Personal history of colonic polyps CPT copyright 2019 American Medical Association. All rights reserved. The codes documented in this report are preliminary and upon coder review may  be revised to meet current compliance requirements. Dr. Ulyess Mort [name] MD, MD 05/09/2021  11:17:59 AM This report has been signed electronically. Number of Addenda: 0 Note Initiated On: 05/09/2021 9:36 AM Scope Withdrawal Time: 0 hours 3 minutes 16 seconds  Total Procedure Duration: 0 hours 11 minutes 31 seconds  Estimated Blood Loss:  Estimated blood loss: none.      [facility]

## 2021-05-09 NOTE — Anesthesia Preprocedure Evaluation (Signed)
Anesthesia Evaluation  Patient identified by MRN, date of birth, ID band Patient awake    Reviewed: Allergy & Precautions, NPO status , Patient's Chart, lab work & pertinent test results  History of Anesthesia Complications (+) Family history of anesthesia reaction  Airway Mallampati: III  TM Distance: >3 FB Neck ROM: Full    Dental  (+) Chipped,    Pulmonary COPD,  COPD inhaler, former smoker,    Pulmonary exam normal breath sounds clear to auscultation       Cardiovascular hypertension, + CAD  Normal cardiovascular exam Rhythm:Regular Rate:Normal     Neuro/Psych PSYCHIATRIC DISORDERS Anxiety Depression TIA Neuromuscular disease CVA, No Residual Symptoms    GI/Hepatic negative GI ROS, Neg liver ROS, Bowel prep,  Endo/Other  Morbid obesity  Renal/GU negative Renal ROS     Musculoskeletal negative musculoskeletal ROS (+)   Abdominal (+) + obese,   Peds  Hematology negative hematology ROS (+)   Anesthesia Other Findings Breast cancer, left ([place]) 06/2017 COPD (chronic obstructive pulmonary disease) (HCC)  states coughs frequently; occasional SOB with ADLs; no home O2  Ductal carcinoma in situ (DCIS) of right breast 06/2017   Dysthymia    Family history of adverse reaction to anesthesia  states father had reaction to anesthesia and had to be resuscitated  History of CVA (cerebrovascular accident) 2014   Hyperlipidemia    Morbid obesity due to excess calories ([place]) complicated by hyperlipidemia 12/13/2006 Qualifier: Diagnosis of By: Diona Browner MD, Amy   Peripheral neuropathy  lower legs - walks with a straight cane     Reproductive/Obstetrics                             Anesthesia Physical  Anesthesia Plan  ASA: 3  Anesthesia Plan: General   Post-op Pain Management:    Induction: Intravenous  PONV Risk Score and Plan: 3 and TIVA and Propofol infusion  Airway Management Planned:  Nasal Cannula  Additional Equipment:   Intra-op Plan:   Post-operative Plan: Extubation in OR  Informed Consent: I have reviewed the patients History and Physical, chart, labs and discussed the procedure including the risks, benefits and alternatives for the proposed anesthesia with the patient or authorized representative who has indicated his/her understanding and acceptance.     Dental advisory given  Plan Discussed with: CRNA, Anesthesiologist and Surgeon  Anesthesia Plan Comments:         Anesthesia Quick Evaluation

## 2021-05-09 NOTE — Assessment & Plan Note (Signed)
Stable, chronic.  Continue current medication.   LDL improved and at goal on atorvastatin 40 mg daily.

## 2021-05-09 NOTE — Assessment & Plan Note (Signed)
Improving control.  Encouraged exercise, weight loss, healthy eating habits.

## 2021-05-09 NOTE — Assessment & Plan Note (Signed)
No peripheral swelling in office today.

## 2021-05-09 NOTE — Transfer of Care (Signed)
Immediate Anesthesia Transfer of Care Note  Patient: Melanie Schneider  Procedure(s) Performed: COLONOSCOPY WITH PROPOFOL  Patient Location: PACU  Anesthesia Type:General  Level of Consciousness: awake, drowsy and patient cooperative  Airway & Oxygen Therapy: Patient Spontanous Breathing and Patient connected to nasal cannula oxygen  Post-op Assessment: Report given to RN and Post -op Vital signs reviewed and stable  Post vital signs: Reviewed and stable  Last Vitals:  Vitals Value Taken Time  BP 81/52 05/09/21 1119  Temp 36.5 C 05/09/21 1118  Pulse 73 05/09/21 1119  Resp 25 05/09/21 1119  SpO2 95 % 05/09/21 1119  Vitals shown include unvalidated device data.  Last Pain:  Vitals:   05/09/21 1118  TempSrc: Temporal  PainSc: 0-No pain         Complications: No notable events documented.

## 2021-05-09 NOTE — Anesthesia Procedure Notes (Signed)
Date/Time: 05/09/2021 10:51 AM Performed by: Johnna Acosta, CRNA Pre-anesthesia Checklist: Patient identified, Emergency Drugs available, Suction available, Patient being monitored and Timeout performed Patient Re-evaluated:Patient Re-evaluated prior to induction Oxygen Delivery Method: Nasal cannula Preoxygenation: Pre-oxygenation with 100% oxygen Induction Type: IV induction
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
## 2021-05-09 NOTE — Assessment & Plan Note (Signed)
Not interested in amitiza after diarrhea with linzess. She is currently using fiber and trying to drink 3 glasses of water a day. Metamucil

## 2021-05-09 NOTE — Anesthesia Postprocedure Evaluation (Signed)
Anesthesia Post Note  Patient: Melanie Schneider  Procedure(s) Performed: COLONOSCOPY WITH PROPOFOL  Patient location during evaluation: Phase II Anesthesia Type: General Level of consciousness: awake and alert, awake and oriented Pain management: pain level controlled Vital Signs Assessment: post-procedure vital signs reviewed and stable Respiratory status: spontaneous breathing, nonlabored ventilation and respiratory function stable Cardiovascular status: blood pressure returned to baseline and stable Postop Assessment: no apparent nausea or vomiting Anesthetic complications: no   No notable events documented.   Last Vitals:  Vitals:   05/09/21 1118 05/09/21 1119  BP: (!) 81/52 (!) 81/52  Pulse: 76 76  Resp: (!) 32 (!) 33  Temp: 36.5 C   SpO2: 95% 95%    Last Pain:  Vitals:   05/09/21 1138  TempSrc:   PainSc: 0-No pain                 Phill Mutter
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
## 2021-05-09 NOTE — Assessment & Plan Note (Signed)
Stable ,chronic  On no medication. 

## 2021-05-10 ENCOUNTER — Encounter: Payer: Self-pay | Admitting: Gastroenterology

## 2021-05-24 ENCOUNTER — Telehealth: Payer: Self-pay | Admitting: *Deleted

## 2021-05-24 DIAGNOSIS — Z1211 Encounter for screening for malignant neoplasm of colon: Secondary | ICD-10-CM

## 2021-05-24 NOTE — Telephone Encounter (Signed)
Patient called stating that she had a colonoscopy done 05/09/21 by Dr. Marius Ditch. Patient stated that she was told that she was not cleaned out good enough. Patient stated the doctor wanted her to come back the next day to repeat the colonoscopy. Patient stated that she told the doctor that she could not go 3 days without food. Patient stated then she was told to come back in 6 months to have the procedure done again. Patient stated that she would like for Dr. Diona Browner to review the report. Patient wants to know when Dr. Diona Browner thinks that she should have the colonoscopy done again. Patient stated that she does not want to go back to Dr. Marius Ditch and wants a referral to another GI doctor. Patient stated that she would like to be referred to Dr. Michail Sermon at [facility].

## 2021-05-24 NOTE — Telephone Encounter (Signed)
Let pt know below and:  Due to there being a large influx of referrals the GI offices are currently scheduling appointments as far out as late Aug/Sept 2022. They have asked that you call their office regarding your referral and scheduling needs. Otherwise, they will reach out to you as soon as they are able. They are working diligently to get patients called and scheduled as soon as possible.  Colonoscopies will be called according to date/time of the referral entry as these are not of urgent need.    [facility]  [phone] [facility]  [phone] [facility]  [phone]  [facility] can be considered as well. They may be able to book sooner appointments. You can reach [facility] at [phone].   Thank you for your patience and please let us know if you have any questions or concerns.    Thank You!  -  Referrals

## 2021-05-24 NOTE — Telephone Encounter (Signed)
It just happens sometimes. If prep did not work effectively GI MD cannot see well enough to diagnose cancer. Need to repeat it  any time.  I will place referral to the requested GI MD.

## 2021-05-25 NOTE — Telephone Encounter (Signed)
Mrs. Melanie Schneider notified as instructed by telephone.  Patient states understanding.  She did state she had to pay $100 out of pocket for the prep and ask if there is anything cheaper she could use.  I advised to check with Dr. Kathline Magic office because they would need to recommend prep and sometime they may have samples at their office.

## 2021-05-27 ENCOUNTER — Telehealth: Payer: Self-pay | Admitting: Family Medicine

## 2021-05-27 NOTE — Telephone Encounter (Signed)
Chander called in from [facility] due to a referral, she transferred  to somewhere else in 2016 and they are needing records from the last colonoscpy, from dr. Marius Ditch and she had it done but it was incomplete 05/09/21 due to she was not cleaned out good enough and they want be able to do it until next year thats how far they are booked. If she wanted to come back and do a TOC (Transfer of Care) the dr would have to approve it.

## 2021-06-03 ENCOUNTER — Other Ambulatory Visit: Payer: Self-pay | Admitting: Family Medicine

## 2021-06-03 DIAGNOSIS — Z1211 Encounter for screening for malignant neoplasm of colon: Secondary | ICD-10-CM

## 2021-06-03 NOTE — Telephone Encounter (Signed)
Provider needs to place a referral to [facility].

## 2021-06-03 NOTE — Telephone Encounter (Signed)
Referral placed.

## 2021-06-06 ENCOUNTER — Telehealth: Payer: Self-pay | Admitting: Family Medicine

## 2021-06-06 NOTE — Telephone Encounter (Signed)
Patient called in requesting info on her last Colonoscopy  . Would like a call back  [phone]

## 2021-06-06 NOTE — Telephone Encounter (Signed)
Spoke with Mrs. Mceachin.  She was calling to check on the status of her colonoscopy referral to [facility].

## 2021-06-08 NOTE — Telephone Encounter (Signed)
Referral was faxed over on 06/07/21 to [facility]  Patient needs to contact their office to schedule as this is re-establishment of care. Per previous telephone encounter they asked that a referral be placed and they would schedule.   She can reach [facility] at [phone].

## 2021-06-08 NOTE — Telephone Encounter (Signed)
Mrs. Garms notified as instructed by telephone.

## 2021-06-17 ENCOUNTER — Telehealth: Payer: Self-pay | Admitting: Gastroenterology

## 2021-06-17 NOTE — Telephone Encounter (Signed)
That sounds like a medical record request. I would ask Elmyra Ricks if she has sent medical record for this patient. The medical records do not come to [place]

## 2021-06-17 NOTE — Telephone Encounter (Signed)
Pt. Says she is having another colonoscopy done by another Dr. She said she said the other DR. Is waiting for the paperwork to be sent over

## 2021-06-22 ENCOUNTER — Ambulatory Visit: Payer: Medicare HMO | Admitting: Gastroenterology

## 2021-06-30 NOTE — Progress Notes (Signed)
 Patient Care Team: Bedsole, Amy E, MD as PCP - General Cornett, Thomas, MD as Consulting Physician (General Surgery) , , MD as Consulting Physician (Hematology and Oncology) Moody, John, MD as Consulting Physician (Radiation Oncology) Causey, Lindsey Cornetto, NP as Nurse Practitioner (Hematology and Oncology)  DIAGNOSIS:    ICD-10-CM   1. Ductal carcinoma in situ (DCIS) of right breast  D05.11       SUMMARY OF ONCOLOGIC HISTORY: Oncology History  Ductal carcinoma in situ (DCIS) of right breast  06/28/2017 Initial Diagnosis   Ductal carcinoma in situ (DCIS) of right breast   07/24/2017 Surgery   Right lumpectomy: DCIS intermediate grade 0.6 cm, Tis NX stage 0   08/29/2017 - 10/01/2017 Radiation Therapy   Adjuvant radiation therapy   History of breast cancer  06/26/2017 Initial Diagnosis   Screening detected right breast calcifications and architectural distortion on the left breast; right breast calcs 1.1 cm normal biopsy DCIS ER 90%, PR 95%, grade 1-2; Tis Nx stage 0 left breast distortion 1.8 cm mass by ultrasound biopsy: IDC with DCIS ER 95%, PR 5%, Ki-67 15%, HER-2 negative ratio 1.39, grade 1-2, T1c N0 stage IA clinical stage   07/24/2017 Surgery   Left lumpectomy: IDC grade 2, 1.5 cm, intermediate grade DCIS, anterior margin broadly positive, posterior margin focally positive, lymphovascular invasion present, 0/2 lymph nodes negative T1CN0 stage I a   08/29/2017 - 10/01/2017 Radiation Therapy   Adjuvant radiation therapy   10/2017 -  Anti-estrogen oral therapy   Letrozole daily     CHIEF COMPLIANT: Follow-up of left breast cancer on letrozole  INTERVAL HISTORY: Melanie Schneider is a 75 y.o. with above-mentioned history of left breast cancer treated with lumpectomy, radiation, and who is currently on oral antiestrogen therapy with letrozole. Mammogram on 06/28/2020 showed no evidence of malignancy. She presents to the clinic today for annual follow-up.  She  has chronic constipation.  She goes for bowel movement only once a week and that to after she takes a laxative.  ALLERGIES:  is allergic to iodine, other, povidone-iodine, adhesive [tape], and latex.  MEDICATIONS:  Current Outpatient Medications  Medication Sig Dispense Refill   albuterol (PROVENTIL HFA;VENTOLIN HFA) 108 (90 Base) MCG/ACT inhaler Inhale 2 puffs into the lungs every 6 (six) hours as needed for wheezing or shortness of breath (with aerochamber). 1 Inhaler 3   atorvastatin (LIPITOR) 40 MG tablet Take 1 tablet (40 mg total) by mouth daily. 90 tablet 3   letrozole (FEMARA) 2.5 MG tablet Take 1 tablet (2.5 mg total) by mouth daily. 90 tablet 3   lubiprostone (AMITIZA) 24 MCG capsule Take 1 capsule (24 mcg total) by mouth 2 (two) times daily with a meal. 60 capsule 3   MELATONIN ER PO Take by mouth.     No current facility-administered medications for this visit.   Facility-Administered Medications Ordered in Other Visits  Medication Dose Route Frequency Provider Last Rate Last Admin   SONAFINE emulsion 1 application  1 application Topical BID Perkins, Alison Claire, PA-C   1 application at 10/02/17 1521    PHYSICAL EXAMINATION: ECOG PERFORMANCE STATUS: 2 - Symptomatic, <50% confined to bed  Vitals:   07/01/21 1121  BP: (!) 142/76  Pulse: 63  Resp: 18  Temp: 97.7 F (36.5 C)  SpO2: 99%   Filed Weights   07/01/21 1121  Weight: 181 lb 11.2 oz (82.4 kg)    BREAST: No palpable masses or nodules in either right or left breasts. No palpable axillary   supraclavicular or infraclavicular adenopathy no breast tenderness or nipple discharge. (exam performed in the presence of a chaperone)  LABORATORY DATA:  I have reviewed the data as listed CMP Latest Ref Rng & Units 02/22/2021 10/20/2019 10/17/2018  Glucose 70 - 99 mg/dL 104(H) 102(H) 108(H)  BUN 6 - 23 mg/dL _0 Creatinine 0.40 - 1.20 mg/dL 0.72 0.56 0.67  Sodium 135 - 145 mEq/L 144 140 139  Potassium 3.5 - 5.1 mEq/L  4.5 3.9 4.6  Chloride 96 - 112 mEq/L 109 106 105  CO2 19 - 32 mEq/L _1 Calcium 8.4 - 10.5 mg/dL 9.4 9.0 9.1  Total Protein 6.0 - 8.3 g/dL 6.6 6.3 6.9  Total Bilirubin 0.2 - 1.2 mg/dL 0.8 0.6 0.5  Alkaline Phos 39 - 117 U/L 58 58 54  AST 0 - 37 U/L _2 ALT 0 - 35 U/L _3 Lab Results  Component Value Date   WBC 7.6 07/04/2017   HGB 12.6 07/04/2017   HCT 38.9 07/04/2017   MCV 85.2 07/04/2017   PLT 267 07/04/2017   NEUTROABS 5.0 07/04/2017    ASSESSMENT & PLAN:  Ductal carcinoma in situ (DCIS) of right breast 07/24/2017 left lumpectomy: IDC grade 2, 1.5 cm, intermediate grade DCIS, anterior margin broadly positive, posterior margin focally positive, lymphovascular invasion present, 0/2 lymph nodes negative T1CN0 stage I a   Right lumpectomy: DCIS grade 2 Tis NX stage 0 adjuvant radiation therapy 08/29/17- 10/01/17   Recommendations: Adjuvant antiestrogen therapy with Letrozole 2.5 mg daily started October 26, 2017 She will take letrozole for 1 more year and then she will be done with her treatment.  Letrozole toxicities: Denies any hot flashes or myalgias.   Breast cancer surveillance: 1.  Breast exam 07/01/2021: benign 2. Mammogram  06/28/2020: Benign breast density category C I renewed her prescription for letrozole.   Severe constipation: Instructed her to take Colace once or twice a day for stool softener to help regulate her bowel movements. RTC in 1 year for follow-up    No orders of the defined types were placed in this encounter.  The patient has a good understanding of the overall plan. she agrees with it. she will call with any problems that may develop before the next visit here.  Total time spent: 20 mins including face to face time and time spent for planning, charting and coordination of care  Rulon Eisenmenger, MD, MPH 07/01/2021  I, Thana Ates, am acting as scribe for Dr. Nicholas Lose.  I have reviewed the above documentation for  accuracy and completeness, and I agree with the above.

## 2021-07-01 ENCOUNTER — Inpatient Hospital Stay: Payer: Medicare HMO | Attending: Hematology and Oncology | Admitting: Hematology and Oncology

## 2021-07-01 ENCOUNTER — Other Ambulatory Visit: Payer: Self-pay

## 2021-07-01 DIAGNOSIS — D0511 Intraductal carcinoma in situ of right breast: Secondary | ICD-10-CM

## 2021-07-01 DIAGNOSIS — Z79811 Long term (current) use of aromatase inhibitors: Secondary | ICD-10-CM | POA: Insufficient documentation

## 2021-07-01 DIAGNOSIS — C50912 Malignant neoplasm of unspecified site of left female breast: Secondary | ICD-10-CM | POA: Insufficient documentation

## 2021-07-01 DIAGNOSIS — Z170 Estrogen receptor positive status [ER+]: Secondary | ICD-10-CM | POA: Insufficient documentation

## 2021-07-01 MED ORDER — LETROZOLE 2.5 MG PO TABS: 2.5000 mg | ORAL_TABLET | Freq: Every day | ORAL | 3 refills | Status: DC

## 2021-07-01 NOTE — Assessment & Plan Note (Signed)
07/24/2017 left lumpectomy: IDC grade 2, 1.5 cm, intermediate grade DCIS, anterior margin broadly positive, posterior margin focally positive, lymphovascular invasion present, 0/2 lymph nodes negative T1CN0 stage I a  Right lumpectomy: DCIS grade 2 Tis NX stage 0 adjuvant radiation therapy 08/29/17- 10/01/17  Recommendations: Adjuvant antiestrogen therapy with Letrozole 2.5 mg dailystartedFebruary 1, 2019  Letrozoletoxicities:Denies any hot flashes or myalgias.  Breast cancer surveillance: 1.Breast exam10/03/2021: benign 2.Mammogram 06/28/2020: Benign breast density category C I renewed her prescription for letrozole.  RTC in1 year for follow-up

## 2021-07-06 DIAGNOSIS — Z1231 Encounter for screening mammogram for malignant neoplasm of breast: Secondary | ICD-10-CM | POA: Diagnosis not present

## 2021-07-21 ENCOUNTER — Encounter: Payer: Self-pay | Admitting: Family Medicine

## 2021-07-21 DIAGNOSIS — R921 Mammographic calcification found on diagnostic imaging of breast: Secondary | ICD-10-CM | POA: Diagnosis not present

## 2021-07-21 DIAGNOSIS — R922 Inconclusive mammogram: Secondary | ICD-10-CM | POA: Diagnosis not present

## 2021-07-21 LAB — HM MAMMOGRAPHY

## 2021-07-28 DIAGNOSIS — Z6832 Body mass index (BMI) 32.0-32.9, adult: Secondary | ICD-10-CM | POA: Diagnosis not present

## 2021-07-28 DIAGNOSIS — Z7722 Contact with and (suspected) exposure to environmental tobacco smoke (acute) (chronic): Secondary | ICD-10-CM | POA: Diagnosis not present

## 2021-07-28 DIAGNOSIS — E669 Obesity, unspecified: Secondary | ICD-10-CM | POA: Diagnosis not present

## 2021-07-28 DIAGNOSIS — Z79811 Long term (current) use of aromatase inhibitors: Secondary | ICD-10-CM | POA: Diagnosis not present

## 2021-07-28 DIAGNOSIS — E785 Hyperlipidemia, unspecified: Secondary | ICD-10-CM | POA: Diagnosis not present

## 2021-07-28 DIAGNOSIS — R69 Illness, unspecified: Secondary | ICD-10-CM | POA: Diagnosis not present

## 2021-07-28 DIAGNOSIS — C50919 Malignant neoplasm of unspecified site of unspecified female breast: Secondary | ICD-10-CM | POA: Diagnosis not present

## 2021-07-28 DIAGNOSIS — J449 Chronic obstructive pulmonary disease, unspecified: Secondary | ICD-10-CM | POA: Diagnosis not present

## 2021-07-28 DIAGNOSIS — M810 Age-related osteoporosis without current pathological fracture: Secondary | ICD-10-CM | POA: Diagnosis not present

## 2021-10-18 ENCOUNTER — Encounter: Payer: Self-pay | Admitting: Family Medicine

## 2021-10-18 ENCOUNTER — Other Ambulatory Visit: Payer: Self-pay

## 2021-10-18 ENCOUNTER — Ambulatory Visit (INDEPENDENT_AMBULATORY_CARE_PROVIDER_SITE_OTHER): Payer: Medicare HMO | Admitting: Family Medicine

## 2021-10-18 VITALS — BP 120/70 | HR 65 | Temp 97.5°F | Ht 63.0 in | Wt 181.5 lb

## 2021-10-18 DIAGNOSIS — I10 Essential (primary) hypertension: Secondary | ICD-10-CM

## 2021-10-18 DIAGNOSIS — I5032 Chronic diastolic (congestive) heart failure: Secondary | ICD-10-CM | POA: Diagnosis not present

## 2021-10-18 DIAGNOSIS — J449 Chronic obstructive pulmonary disease, unspecified: Secondary | ICD-10-CM

## 2021-10-18 DIAGNOSIS — Z1211 Encounter for screening for malignant neoplasm of colon: Secondary | ICD-10-CM

## 2021-10-18 DIAGNOSIS — K5909 Other constipation: Secondary | ICD-10-CM | POA: Diagnosis not present

## 2021-10-18 DIAGNOSIS — Z01818 Encounter for other preprocedural examination: Secondary | ICD-10-CM | POA: Diagnosis not present

## 2021-10-18 NOTE — Assessment & Plan Note (Signed)
Low risk procedure. No contraindication for dental procedure.

## 2021-10-18 NOTE — Assessment & Plan Note (Signed)
Chronic, no evidence of fluid overload.

## 2021-10-18 NOTE — Assessment & Plan Note (Signed)
Restart fiber, increase water , use miralax BID.

## 2021-10-18 NOTE — Assessment & Plan Note (Signed)
Stable control on no medications except albuterol prn. Using rarely.

## 2021-10-18 NOTE — Assessment & Plan Note (Signed)
Stable, chronic , on no medication.

## 2021-10-18 NOTE — Patient Instructions (Signed)
°   Schedule colonoscopy. [facility]  [phone] [facility]  [phone] [facility]  [phone].  You can reach [facility] at [phone].

## 2021-10-18 NOTE — Progress Notes (Signed)
Patient ID: Melanie Schneider, female    DOB: [birth year], 76 y.o.   MRN: [id number]  This visit was conducted in person.  BP 120/70    Pulse 65    Temp (!) 97.5 F (36.4 C) (Temporal)    Ht 5\' 3"  (1.6 m)    Wt 181 lb 8 oz (82.3 kg)    SpO2 95%    BMI 32.15 kg/m    CC:  Chief Complaint  Patient presents with   Pre-Op Clearance    For Dental Procedure    Subjective:   HPI: Melanie Schneider is a 76 y.o. female  with COPD, HTN, prediabetes, hx of CVA presenting on 10/18/2021 for Pre-Op Clearance (For Dental Procedure)  She has an upcoming dental procedure.. broken left upper canine. Using local anesthesia.  2014 had aspiration pneumonitis, ETOH withdrawal... she did not have a stroke per my records... did have confusion when she was severely hypoxic.    She denies peripheral edema, no CP, no SOB.  She is on no blood thinners.  Lab Results  Component Value Date   HGBA1C 5.8 02/22/2021     Relevant past medical, surgical, family and social history reviewed and updated as indicated. Interim medical history since our last visit reviewed. Allergies and medications reviewed and updated. Outpatient Medications Prior to Visit  Medication Sig Dispense Refill   albuterol (PROVENTIL HFA;VENTOLIN HFA) 108 (90 Base) MCG/ACT inhaler Inhale 2 puffs into the lungs every 6 (six) hours as needed for wheezing or shortness of breath (with aerochamber). 1 Inhaler 3   atorvastatin (LIPITOR) 40 MG tablet Take 1 tablet (40 mg total) by mouth daily. 90 tablet 3   letrozole (FEMARA) 2.5 MG tablet Take 1 tablet (2.5 mg total) by mouth daily. 90 tablet 3   MELATONIN ER PO Take 1-2 each by mouth at bedtime as needed.     Facility-Administered Medications Prior to Visit  Medication Dose Route Frequency Provider Last Rate Last Admin   SONAFINE emulsion 1 application  1 application Topical BID Hayden Pedro, PA-C   1 application at 29/52/84 1521     Per HPI unless specifically  indicated in ROS section below Review of Systems  Constitutional:  Negative for fatigue and fever.  HENT:  Negative for ear pain.   Eyes:  Negative for pain.  Respiratory:  Negative for chest tightness and shortness of breath.   Cardiovascular:  Negative for chest pain, palpitations and leg swelling.  Gastrointestinal:  Negative for abdominal pain.  Genitourinary:  Negative for dysuria.  Objective:  BP 120/70    Pulse 65    Temp (!) 97.5 F (36.4 C) (Temporal)    Ht 5\' 3"  (1.6 m)    Wt 181 lb 8 oz (82.3 kg)    SpO2 95%    BMI 32.15 kg/m   Wt Readings from Last 3 Encounters:  10/18/21 181 lb 8 oz (82.3 kg)  07/01/21 181 lb 11.2 oz (82.4 kg)  05/09/21 188 lb (85.3 kg)      Physical Exam Constitutional:      General: She is not in acute distress.    Appearance: Normal appearance. She is well-developed. She is not ill-appearing or toxic-appearing.     Comments: Poor dentition  HENT:     Head: Normocephalic.     Right Ear: Hearing, tympanic membrane, ear canal and external ear normal. Tympanic membrane is not erythematous, retracted or bulging.     Left Ear: Hearing, tympanic membrane, ear canal  and external ear normal. Tympanic membrane is not erythematous, retracted or bulging.     Nose: No mucosal edema or rhinorrhea.     Right Sinus: No maxillary sinus tenderness or frontal sinus tenderness.     Left Sinus: No maxillary sinus tenderness or frontal sinus tenderness.     Mouth/Throat:     Pharynx: Uvula midline.  Eyes:     General: Lids are normal. Lids are everted, no foreign bodies appreciated.     Conjunctiva/sclera: Conjunctivae normal.     Pupils: Pupils are equal, round, and reactive to light.  Neck:     Thyroid: No thyroid mass or thyromegaly.     Vascular: No carotid bruit.     Trachea: Trachea normal.  Cardiovascular:     Rate and Rhythm: Normal rate and regular rhythm.     Pulses: Normal pulses.     Heart sounds: Normal heart sounds, S1 normal and S2 normal. No  murmur heard.   No friction rub. No gallop.  Pulmonary:     Effort: Pulmonary effort is normal. No tachypnea or respiratory distress.     Breath sounds: Normal breath sounds. No decreased breath sounds, wheezing, rhonchi or rales.  Abdominal:     General: Bowel sounds are normal.     Palpations: Abdomen is soft.     Tenderness: There is no abdominal tenderness.  Musculoskeletal:     Cervical back: Normal range of motion and neck supple.  Skin:    General: Skin is warm and dry.     Findings: No rash.  Neurological:     Mental Status: She is alert.  Psychiatric:        Mood and Affect: Mood is not anxious or depressed.        Speech: Speech normal.        Behavior: Behavior normal. Behavior is cooperative.        Thought Content: Thought content normal.        Judgment: Judgment normal.      Results for orders placed or performed in visit on 07/21/21  [facility] MAMMOGRAPHY  Result Value Ref Range   HM Mammogram 0-4 Bi-Rad 0-4 Bi-Rad, Self Reported Normal    This visit occurred during the SARS-CoV-2 public health emergency.  Safety protocols were in place, including screening questions prior to the visit, additional usage of staff PPE, and extensive cleaning of exam room while observing appropriate contact time as indicated for disinfecting solutions.   COVID 19 screen:  No recent travel or known exposure to COVID19 The patient denies respiratory symptoms of COVID 19 at this time. The importance of social distancing was discussed today.   Assessment and Plan    Problem List Items Addressed This Visit     Chronic constipation    Restart fiber, increase water , use miralax BID.      Chronic diastolic heart failure (HCC)    Chronic, no evidence of fluid overload.      COPD  GOLD 0 / AB     Stable control on no medications except albuterol prn. Using rarely.      Pre-procedural examination - Primary    Low risk procedure. No contraindication for dental procedure.        Primary hypertension    Stable, chronic , on no medication.      Other Visit Diagnoses     Colon cancer screening       Relevant Orders   Ambulatory referral to Gastroenterology  Eliezer Lofts, MD

## 2021-11-15 DIAGNOSIS — Z1211 Encounter for screening for malignant neoplasm of colon: Secondary | ICD-10-CM | POA: Diagnosis not present

## 2021-11-15 DIAGNOSIS — Z86010 Personal history of colonic polyps: Secondary | ICD-10-CM | POA: Diagnosis not present

## 2021-11-15 DIAGNOSIS — K5900 Constipation, unspecified: Secondary | ICD-10-CM | POA: Diagnosis not present

## 2021-12-07 DIAGNOSIS — Z86010 Personal history of colonic polyps: Secondary | ICD-10-CM | POA: Diagnosis not present

## 2021-12-07 DIAGNOSIS — K5730 Diverticulosis of large intestine without perforation or abscess without bleeding: Secondary | ICD-10-CM | POA: Diagnosis not present

## 2021-12-07 DIAGNOSIS — D122 Benign neoplasm of ascending colon: Secondary | ICD-10-CM | POA: Diagnosis not present

## 2021-12-07 DIAGNOSIS — K648 Other hemorrhoids: Secondary | ICD-10-CM | POA: Diagnosis not present

## 2021-12-09 DIAGNOSIS — D122 Benign neoplasm of ascending colon: Secondary | ICD-10-CM | POA: Diagnosis not present

## 2022-01-04 ENCOUNTER — Other Ambulatory Visit: Payer: Self-pay | Admitting: Family Medicine

## 2022-01-04 ENCOUNTER — Telehealth: Payer: Self-pay

## 2022-01-04 ENCOUNTER — Ambulatory Visit (INDEPENDENT_AMBULATORY_CARE_PROVIDER_SITE_OTHER): Payer: Medicare HMO

## 2022-01-04 VITALS — Ht 63.0 in | Wt 181.0 lb

## 2022-01-04 DIAGNOSIS — R430 Anosmia: Secondary | ICD-10-CM

## 2022-01-04 DIAGNOSIS — L602 Onychogryphosis: Secondary | ICD-10-CM

## 2022-01-04 DIAGNOSIS — Z87891 Personal history of nicotine dependence: Secondary | ICD-10-CM

## 2022-01-04 DIAGNOSIS — H9193 Unspecified hearing loss, bilateral: Secondary | ICD-10-CM

## 2022-01-04 DIAGNOSIS — Z0000 Encounter for general adult medical examination without abnormal findings: Secondary | ICD-10-CM | POA: Diagnosis not present

## 2022-01-04 DIAGNOSIS — J449 Chronic obstructive pulmonary disease, unspecified: Secondary | ICD-10-CM

## 2022-01-04 DIAGNOSIS — K2900 Acute gastritis without bleeding: Secondary | ICD-10-CM | POA: Insufficient documentation

## 2022-01-04 DIAGNOSIS — K922 Gastrointestinal hemorrhage, unspecified: Secondary | ICD-10-CM | POA: Insufficient documentation

## 2022-01-04 NOTE — Telephone Encounter (Signed)
During AWV pt c/o hearing loss and loss of smell x 3 months. Pt requests referral to ENT, if possible. ? ?Pt also c/o inability to reach toe nails to trim them and would like referral to Dr. Prudence Davidson a [facility] in [place]. Thank you  ? ? ?
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
## 2022-01-04 NOTE — Progress Notes (Signed)
Dr. Prudence Davidson a [facility] in [place] ?

## 2022-01-04 NOTE — Patient Instructions (Signed)
Ms. Melanie Schneider , ?Thank you for taking time to come for your Medicare Wellness Visit. I appreciate your ongoing commitment to your health goals. Please review the following plan we discussed and let me know if I can assist you in the future.  ? ?Screening recommendations/referrals: ?Colonoscopy: Done 05/09/2021 Repeat in 10 years ? ?Mammogram: Done 07/21/2021 Repeat annually ? ?Bone Density: Done 06/24/2018 Repeat every 2 years ? ?Recommended yearly ophthalmology/optometry visit for glaucoma screening and checkup ?Recommended yearly dental visit for hygiene and checkup ? ?Vaccinations: ?Influenza vaccine: Done 06/27/21 Repeat annually ? ?Pneumococcal vaccine: Done 06/25/2015, 09/06/2021. 10/10/2013. ?Tdap vaccine: Done 05/26/2009 Repeat in 10 years ? ?Shingles vaccine: Done 06/16/2021 and 03/01/2021.   ?Covid-19:Done 06/16/2021, 12/24/2020, 06/30/2020, 12/02/2019, 11/11/2019. ? ?Advanced directives: Advance directive discussed with you today. I have provided a copy for you to complete at home and have notarized. Once this is complete please bring a copy in to our office so we can scan it into your chart. ?MAILED OUT TODAY. ? ?Conditions/risks identified: Aim for 30 minutes of exercise or brisk walking, 6-8 glasses of water, and 5 servings of fruits and vegetables each day. ? ? ?Next appointment: Follow up in one year for your annual wellness visit 2024. ? ? ?Preventive Care 27 Years and Older, Female ?Preventive care refers to lifestyle choices and visits with your health care provider that can promote health and wellness. ?What does preventive care include? ?A yearly physical exam. This is also called an annual well check. ?Dental exams once or twice a year. ?Routine eye exams. Ask your health care provider how often you should have your eyes checked. ?Personal lifestyle choices, including: ?Daily care of your teeth and gums. ?Regular physical activity. ?Eating a healthy diet. ?Avoiding tobacco and drug use. ?Limiting alcohol  use. ?Practicing safe sex. ?Taking low-dose aspirin every day. ?Taking vitamin and mineral supplements as recommended by your health care provider. ?What happens during an annual well check? ?The services and screenings done by your health care provider during your annual well check will depend on your age, overall health, lifestyle risk factors, and family history of disease. ?Counseling  ?Your health care provider may ask you questions about your: ?Alcohol use. ?Tobacco use. ?Drug use. ?Emotional well-being. ?Home and relationship well-being. ?Sexual activity. ?Eating habits. ?History of falls. ?Memory and ability to understand (cognition). ?Work and work Statistician. ?Reproductive health. ?Screening  ?You may have the following tests or measurements: ?Height, weight, and BMI. ?Blood pressure. ?Lipid and cholesterol levels. These may be checked every 5 years, or more frequently if you are over 81 years old. ?Skin check. ?Lung cancer screening. You may have this screening every year starting at age 69 if you have a 30-pack-year history of smoking and currently smoke or have quit within the past 15 years. ?Fecal occult blood test (FOBT) of the stool. You may have this test every year starting at age 53. ?Flexible sigmoidoscopy or colonoscopy. You may have a sigmoidoscopy every 5 years or a colonoscopy every 10 years starting at age 55. ?Hepatitis C blood test. ?Hepatitis B blood test. ?Sexually transmitted disease (STD) testing. ?Diabetes screening. This is done by checking your blood sugar (glucose) after you have not eaten for a while (fasting). You may have this done every 1-3 years. ?Bone density scan. This is done to screen for osteoporosis. You may have this done starting at age 43. ?Mammogram. This may be done every 1-2 years. Talk to your health care provider about how often you should have regular  mammograms. ?Talk with your health care provider about your test results, treatment options, and if necessary,  the need for more tests. ?Vaccines  ?Your health care provider may recommend certain vaccines, such as: ?Influenza vaccine. This is recommended every year. ?Tetanus, diphtheria, and acellular pertussis (Tdap, Td) vaccine. You may need a Td booster every 10 years. ?Zoster vaccine. You may need this after age 50. ?Pneumococcal 13-valent conjugate (PCV13) vaccine. One dose is recommended after age 74. ?Pneumococcal polysaccharide (PPSV23) vaccine. One dose is recommended after age 42. ?Talk to your health care provider about which screenings and vaccines you need and how often you need them. ?This information is not intended to replace advice given to you by your health care provider. Make sure you discuss any questions you have with your health care provider. ?Document Released: 10/08/2015 Document Revised: 05/31/2016 Document Reviewed: 07/13/2015 ?Elsevier Interactive Patient Education ? 2017 [employer]. ? ?Fall Prevention in the Home ?Falls can cause injuries. They can happen to people of all ages. There are many things you can do to make your home safe and to help prevent falls. ?What can I do on the outside of my home? ?Regularly fix the edges of walkways and driveways and fix any cracks. ?Remove anything that might make you trip as you walk through a door, such as a raised step or threshold. ?Trim any bushes or trees on the path to your home. ?Use bright outdoor lighting. ?Clear any walking paths of anything that might make someone trip, such as rocks or tools. ?Regularly check to see if handrails are loose or broken. Make sure that both sides of any steps have handrails. ?Any raised decks and porches should have guardrails on the edges. ?Have any leaves, snow, or ice cleared regularly. ?Use sand or salt on walking paths during winter. ?Clean up any spills in your garage right away. This includes oil or grease spills. ?What can I do in the bathroom? ?Use night lights. ?Install grab bars by the toilet and in the  tub and shower. Do not use towel bars as grab bars. ?Use non-skid mats or decals in the tub or shower. ?If you need to sit down in the shower, use a plastic, non-slip stool. ?Keep the floor dry. Clean up any water that spills on the floor as soon as it happens. ?Remove soap buildup in the tub or shower regularly. ?Attach bath mats securely with double-sided non-slip rug tape. ?Do not have throw rugs and other things on the floor that can make you trip. ?What can I do in the bedroom? ?Use night lights. ?Make sure that you have a light by your bed that is easy to reach. ?Do not use any sheets or blankets that are too big for your bed. They should not hang down onto the floor. ?Have a firm chair that has side arms. You can use this for support while you get dressed. ?Do not have throw rugs and other things on the floor that can make you trip. ?What can I do in the kitchen? ?Clean up any spills right away. ?Avoid walking on wet floors. ?Keep items that you use a lot in easy-to-reach places. ?If you need to reach something above you, use a strong step stool that has a grab bar. ?Keep electrical cords out of the way. ?Do not use floor polish or wax that makes floors slippery. If you must use wax, use non-skid floor wax. ?Do not have throw rugs and other things on the floor that can  make you trip. ?What can I do with my stairs? ?Do not leave any items on the stairs. ?Make sure that there are handrails on both sides of the stairs and use them. Fix handrails that are broken or loose. Make sure that handrails are as long as the stairways. ?Check any carpeting to make sure that it is firmly attached to the stairs. Fix any carpet that is loose or worn. ?Avoid having throw rugs at the top or bottom of the stairs. If you do have throw rugs, attach them to the floor with carpet tape. ?Make sure that you have a light switch at the top of the stairs and the bottom of the stairs. If you do not have them, ask someone to add them for  you. ?What else can I do to help prevent falls? ?Wear shoes that: ?Do not have high heels. ?Have rubber bottoms. ?Are comfortable and fit you well. ?Are closed at the toe. Do not wear sandals. ?If you use a

## 2022-01-04 NOTE — Telephone Encounter (Signed)
Notify patient, referrals sent ?

## 2022-01-04 NOTE — Telephone Encounter (Signed)
Mrs. Langham notified that referral have be placed and someone will be in touch with her to gets these appointments scheduled for her.  ?

## 2022-01-05 ENCOUNTER — Ambulatory Visit: Payer: Medicare HMO

## 2022-01-05 ENCOUNTER — Telehealth: Payer: Self-pay

## 2022-01-05 NOTE — Telephone Encounter (Signed)
Call ? ?She can call her insurance to see if medication in the same family of Linzess at a lower dose is less costly such as Amitiza.  Otherwise daily to twice daily MiraLAX would be the best long-term and cheapest option for severe constipation. ?

## 2022-01-05 NOTE — Telephone Encounter (Signed)
During [facility] call yesterday, pt c/o issues with Linzess causing her to have diarrhea and also that the medication cost $105.00 per month. Pt asks if there is another medication to help her with her constipation but not cost as much? Thank you. ? ?

## 2022-01-06 NOTE — Telephone Encounter (Signed)
Spoke to pt stated--agreed to try Miralax twice daily and  will call insurance for other medication alternative (Linzess). Pt will call the office back for medication info. ?

## 2022-01-09 DIAGNOSIS — K5900 Constipation, unspecified: Secondary | ICD-10-CM | POA: Diagnosis not present

## 2022-01-09 DIAGNOSIS — Z86010 Personal history of colonic polyps: Secondary | ICD-10-CM | POA: Diagnosis not present

## 2022-01-12 ENCOUNTER — Encounter: Payer: Self-pay | Admitting: Podiatry

## 2022-01-12 ENCOUNTER — Ambulatory Visit: Payer: Medicare HMO | Admitting: Podiatry

## 2022-01-12 DIAGNOSIS — M79675 Pain in left toe(s): Secondary | ICD-10-CM

## 2022-01-12 DIAGNOSIS — M79674 Pain in right toe(s): Secondary | ICD-10-CM | POA: Diagnosis not present

## 2022-01-12 DIAGNOSIS — B351 Tinea unguium: Secondary | ICD-10-CM

## 2022-01-12 DIAGNOSIS — B353 Tinea pedis: Secondary | ICD-10-CM

## 2022-01-12 MED ORDER — CLOTRIMAZOLE-BETAMETHASONE 1-0.05 % EX CREA: 1.0000 "application " | TOPICAL_CREAM | Freq: Two times a day (BID) | CUTANEOUS | 0 refills | Status: DC

## 2022-01-12 NOTE — Progress Notes (Signed)
?  Subjective:  ?Patient ID: Melanie Schneider, female    DOB: May 23, 1946,  MRN: [id number] ? ?Chief Complaint  ?Patient presents with  ? Nail Problem  ? ?76 y.o. female returns for the above complaint.  Patient presents with thickened elongated dystrophic toenails x10.  Pain evaluation.  Patient would like any debrided down.  She states that it hurts with ambulation.  She has secondary complaint of right medial leg rash/itching.  She states that has been going for quite some time she is tried some over-the-counter medication which has helped.  She states been present for quite some time.  Helps sometimes. ? ?Objective:  ?There were no vitals filed for this visit. ?Podiatric Exam: ?Vascular: dorsalis pedis and posterior tibial pulses are palpable bilateral. Capillary return is immediate. Temperature gradient is WNL. Skin turgor WNL  ?Sensorium: Normal Semmes Weinstein monofilament test. Normal tactile sensation bilaterally. ?Nail Exam: Pt has thick disfigured discolored nails with subungual debris noted bilateral entire nail hallux through fifth toenails.  Pain on palpation to the nails. ?Ulcer Exam: There is no evidence of ulcer or pre-ulcerative changes or infection. ?Orthopedic Exam: Muscle tone and strength are WNL. No limitations in general ROM. No crepitus or effusions noted.  ?Skin: No Porokeratosis. No infection or ulcers.  Dermatitis/rash noted to the right medial leg.  Subjective component of epidermal lysis noted and itching. ? ? ? ?Assessment & Plan:  ? ?1. Athlete's foot, right   ?2. Pain due to onychomycosis of toenails of both feet   ? ? ?Patient was evaluated and treated and all questions answered. ? ?Right lateral leg fungal infection limited to the skin ?-All questions and concerns were discussed and given that there is good amount of fungal infection is present I believe she will benefit from Lotrisone cream ointment to apply twice a day.  She states understanding will applied immediately.  If  there is no resolve I will discuss oral medication. ? ? ?Onychomycosis with pain  ?-Nails palliatively debrided as below. ?-Educated on self-care ? ?Procedure: Nail Debridement ?Rationale: pain  ?Type of Debridement: manual, sharp debridement. ?Instrumentation: Nail nipper, rotary burr. ?Number of Nails: 10 ? ?Procedures and Treatment: Consent by patient was obtained for treatment procedures. The patient understood the discussion of treatment and procedures well. All questions were answered thoroughly reviewed. Debridement of mycotic and hypertrophic toenails, 1 through 5 bilateral and clearing of subungual debris. No ulceration, no infection noted.  ?Return Visit-Office Procedure: Patient instructed to return to the office for a follow up visit 3 months for continued evaluation and treatment. ? ?Boneta Lucks, DPM ?  ? ?Return in about 3 months (around 04/13/2022) for mayer. ? ?

## 2022-02-07 DIAGNOSIS — C50919 Malignant neoplasm of unspecified site of unspecified female breast: Secondary | ICD-10-CM | POA: Diagnosis not present

## 2022-02-07 DIAGNOSIS — K5900 Constipation, unspecified: Secondary | ICD-10-CM | POA: Diagnosis not present

## 2022-02-07 DIAGNOSIS — Z6832 Body mass index (BMI) 32.0-32.9, adult: Secondary | ICD-10-CM | POA: Diagnosis not present

## 2022-02-07 DIAGNOSIS — Z008 Encounter for other general examination: Secondary | ICD-10-CM | POA: Diagnosis not present

## 2022-02-07 DIAGNOSIS — E785 Hyperlipidemia, unspecified: Secondary | ICD-10-CM | POA: Diagnosis not present

## 2022-02-07 DIAGNOSIS — R32 Unspecified urinary incontinence: Secondary | ICD-10-CM | POA: Diagnosis not present

## 2022-02-07 DIAGNOSIS — G63 Polyneuropathy in diseases classified elsewhere: Secondary | ICD-10-CM | POA: Diagnosis not present

## 2022-02-07 DIAGNOSIS — Z79811 Long term (current) use of aromatase inhibitors: Secondary | ICD-10-CM | POA: Diagnosis not present

## 2022-02-07 DIAGNOSIS — G4700 Insomnia, unspecified: Secondary | ICD-10-CM | POA: Diagnosis not present

## 2022-02-07 DIAGNOSIS — J449 Chronic obstructive pulmonary disease, unspecified: Secondary | ICD-10-CM | POA: Diagnosis not present

## 2022-02-07 DIAGNOSIS — R69 Illness, unspecified: Secondary | ICD-10-CM | POA: Diagnosis not present

## 2022-02-07 DIAGNOSIS — E669 Obesity, unspecified: Secondary | ICD-10-CM | POA: Diagnosis not present

## 2022-02-08 ENCOUNTER — Telehealth: Payer: Self-pay | Admitting: Family Medicine

## 2022-02-08 DIAGNOSIS — E7800 Pure hypercholesterolemia, unspecified: Secondary | ICD-10-CM

## 2022-02-08 DIAGNOSIS — E538 Deficiency of other specified B group vitamins: Secondary | ICD-10-CM

## 2022-02-08 DIAGNOSIS — R7309 Other abnormal glucose: Secondary | ICD-10-CM

## 2022-02-08 NOTE — Telephone Encounter (Signed)
-----   Message from Velna Hatchet, RT sent at 02/06/2022  1:51 PM EDT ----- ?Regarding: Lab Thu 02/23/22 ?Patient is scheduled for cpx, please order future labs.  Thanks, Anda Kraft ? ? ?

## 2022-02-21 ENCOUNTER — Other Ambulatory Visit: Payer: Self-pay

## 2022-02-21 DIAGNOSIS — Z122 Encounter for screening for malignant neoplasm of respiratory organs: Secondary | ICD-10-CM

## 2022-02-21 DIAGNOSIS — Z87891 Personal history of nicotine dependence: Secondary | ICD-10-CM

## 2022-02-23 ENCOUNTER — Other Ambulatory Visit (INDEPENDENT_AMBULATORY_CARE_PROVIDER_SITE_OTHER): Payer: Medicare HMO

## 2022-02-23 DIAGNOSIS — E538 Deficiency of other specified B group vitamins: Secondary | ICD-10-CM | POA: Diagnosis not present

## 2022-02-23 DIAGNOSIS — R7309 Other abnormal glucose: Secondary | ICD-10-CM | POA: Diagnosis not present

## 2022-02-23 DIAGNOSIS — E7800 Pure hypercholesterolemia, unspecified: Secondary | ICD-10-CM

## 2022-02-23 LAB — COMPREHENSIVE METABOLIC PANEL
ALT: 10 U/L (ref 0–35)
AST: 13 U/L (ref 0–37)
Albumin: 3.8 g/dL (ref 3.5–5.2)
Alkaline Phosphatase: 57 U/L (ref 39–117)
BUN: 18 mg/dL (ref 6–23)
CO2: 29 mEq/L (ref 19–32)
Calcium: 8.9 mg/dL (ref 8.4–10.5)
Chloride: 108 mEq/L (ref 96–112)
Creatinine, Ser: 0.72 mg/dL (ref 0.40–1.20)
GFR: 81.58 mL/min (ref >60.00)
Glucose, Bld: 85 mg/dL (ref 70–99)
Potassium: 3.7 mEq/L (ref 3.5–5.1)
Sodium: 144 mEq/L (ref 135–145)
Total Bilirubin: 0.8 mg/dL (ref 0.2–1.2)
Total Protein: 6.2 g/dL (ref 6.0–8.3)

## 2022-02-23 LAB — VITAMIN B12: Vitamin B-12: 304 pg/mL (ref 211–911)

## 2022-02-23 LAB — LIPID PANEL
Cholesterol: 117 mg/dL (ref 0–200)
HDL: 41.5 mg/dL (ref >39.00)
LDL Cholesterol: 66 mg/dL (ref 0–99)
NonHDL: 75.97
Total CHOL/HDL Ratio: 3
Triglycerides: 52 mg/dL (ref 0.0–149.0)
VLDL: 10.4 mg/dL (ref 0.0–40.0)

## 2022-02-23 LAB — HEMOGLOBIN A1C: Hgb A1c MFr Bld: 5.8 % (ref 4.6–6.5)

## 2022-02-23 NOTE — Progress Notes (Signed)
No critical labs need to be addressed urgently. We will discuss labs in detail at upcoming office visit.   

## 2022-02-28 ENCOUNTER — Encounter: Payer: Self-pay | Admitting: Family Medicine

## 2022-02-28 ENCOUNTER — Ambulatory Visit (INDEPENDENT_AMBULATORY_CARE_PROVIDER_SITE_OTHER): Payer: Medicare HMO | Admitting: Family Medicine

## 2022-02-28 VITALS — BP 110/60 | HR 63 | Temp 97.8°F | Ht 61.0 in | Wt 172.2 lb

## 2022-02-28 DIAGNOSIS — D0511 Intraductal carcinoma in situ of right breast: Secondary | ICD-10-CM | POA: Diagnosis not present

## 2022-02-28 DIAGNOSIS — E7800 Pure hypercholesterolemia, unspecified: Secondary | ICD-10-CM

## 2022-02-28 DIAGNOSIS — J449 Chronic obstructive pulmonary disease, unspecified: Secondary | ICD-10-CM | POA: Diagnosis not present

## 2022-02-28 DIAGNOSIS — R7309 Other abnormal glucose: Secondary | ICD-10-CM

## 2022-02-28 DIAGNOSIS — E538 Deficiency of other specified B group vitamins: Secondary | ICD-10-CM

## 2022-02-28 DIAGNOSIS — I10 Essential (primary) hypertension: Secondary | ICD-10-CM | POA: Diagnosis not present

## 2022-02-28 DIAGNOSIS — I700 Atherosclerosis of aorta: Secondary | ICD-10-CM

## 2022-02-28 DIAGNOSIS — R430 Anosmia: Secondary | ICD-10-CM | POA: Insufficient documentation

## 2022-02-28 DIAGNOSIS — Z0000 Encounter for general adult medical examination without abnormal findings: Secondary | ICD-10-CM

## 2022-02-28 DIAGNOSIS — K5909 Other constipation: Secondary | ICD-10-CM

## 2022-02-28 DIAGNOSIS — I5032 Chronic diastolic (congestive) heart failure: Secondary | ICD-10-CM

## 2022-02-28 MED ORDER — CLOTRIMAZOLE-BETAMETHASONE 1-0.05 % EX CREA: 1.0000 "application " | TOPICAL_CREAM | Freq: Two times a day (BID) | CUTANEOUS | 0 refills | Status: DC

## 2022-02-28 NOTE — Assessment & Plan Note (Signed)
Followed by oncology.  Currently actively treated with adjuvant anti estrogen therapy with Femara.

## 2022-02-28 NOTE — Assessment & Plan Note (Signed)
Acute, likely due to viral illness versus allergic rhinitis.  We will treat with Flonase 2 sprays per nostril daily.  If not improving can consider referral to ENT.

## 2022-02-28 NOTE — Progress Notes (Signed)
Patient ID: Melanie Schneider, female    DOB: Oct 05, 1945, 76 y.o.   MRN: [id number]  This visit was conducted in person.  BP 110/60   Pulse 63   Temp 97.8 F (36.6 C) (Oral)   Ht '5\' 1"'$  (1.549 m)   Wt 172 lb 4 oz (78.1 kg)   SpO2 96%   BMI 32.55 kg/m    CC:  Chief Complaint  Patient presents with   Annual Exam    Part 2   Issue with smell    Subjective:   HPI: Melanie Schneider is a 76 y.o. female presenting on 02/28/2022 for Annual Exam (Part 2) and Issue with smell  The patient presents for complete physical and review of chronic health problems. He/She also has the following acute concerns today: she has noted a new issue with loss of smell ongoing x  2-3 months. Bland taste of things.  No proceeding illness.  Has chronic runny nose.. does not use nasal sprays.  The patient saw a LPN or RN for medicare wellness visit.  Prevention and wellness was reviewed in detail. Note reviewed and important notes copied below.  Wt Readings from Last 3 Encounters:  02/28/22 172 lb 4 oz (78.1 kg)  01/04/22 181 lb (82.1 kg)  10/18/21 181 lb 8 oz (82.3 kg)    Chronic constipation:  Stable control on miralax daily  Aortic atherosclerosis: LDL now at goal less than 70 on atorvastatin 40 mg p.o. daily Lab Results  Component Value Date   CHOL 117 02/23/2022   HDL 41.50 02/23/2022   LDLCALC 66 02/23/2022   LDLDIRECT 119.9 10/07/2013   TRIG 52.0 02/23/2022   CHOLHDL 3 02/23/2022    Chronic diastolic heart failure: No current volume overload, pulmonary edema  COPD: stable control on no controllers, just using albuterol as needed  Prediabetes: Lab Results  Component Value Date   HGBA1C 5.8 02/23/2022   breast cancer: Followed by oncology, currently actively treated with Femara  Hypertension:  Well controlled on no medication  BP Readings from Last 3 Encounters:  02/28/22 110/60  10/18/21 120/70  07/01/21 (!) 142/76  Using medication without problems or  lightheadedness:  none Chest pain with exertion: none Edema: none Short of breath:none Average home BPs: Other issues:  Relevant past medical, surgical, family and social history reviewed and updated as indicated. Interim medical history since our last visit reviewed. Allergies and medications reviewed and updated. Outpatient Medications Prior to Visit  Medication Sig Dispense Refill   atorvastatin (LIPITOR) 40 MG tablet Take 1 tablet (40 mg total) by mouth daily. 90 tablet 3   clotrimazole-betamethasone (LOTRISONE) cream Apply 1 application. topically 2 (two) times daily. 30 g 0   letrozole (FEMARA) 2.5 MG tablet Take 1 tablet (2.5 mg total) by mouth daily. 90 tablet 3   MELATONIN ER PO Take 1-2 each by mouth at bedtime as needed.     polyethylene glycol powder (GLYCOLAX/MIRALAX) 17 GM/SCOOP powder 17gm mixed in liquid     vitamin B-12 (CYANOCOBALAMIN) 1000 MCG tablet Take 1,000 mcg by mouth daily.     albuterol (PROVENTIL HFA;VENTOLIN HFA) 108 (90 Base) MCG/ACT inhaler Inhale 2 puffs into the lungs every 6 (six) hours as needed for wheezing or shortness of breath (with aerochamber). 1 Inhaler 3   PREVNAR 20 0.5 ML injection      Facility-Administered Medications Prior to Visit  Medication Dose Route Frequency Provider Last Rate Last Admin   SONAFINE emulsion 1 application  1 application.  Topical BID Hayden Pedro, PA-C   1 application. at 10/02/17 1521     Per HPI unless specifically indicated in ROS section below Review of Systems  Constitutional:  Negative for fatigue and fever.  HENT:  Negative for congestion.   Eyes:  Negative for pain.  Respiratory:  Negative for cough and shortness of breath.   Cardiovascular:  Negative for chest pain, palpitations and leg swelling.  Gastrointestinal:  Negative for abdominal pain.  Genitourinary:  Negative for dysuria and vaginal bleeding.  Musculoskeletal:  Negative for back pain.  Neurological:  Negative for syncope,  light-headedness and headaches.  Psychiatric/Behavioral:  Negative for dysphoric mood.   Objective:  BP 110/60   Pulse 63   Temp 97.8 F (36.6 C) (Oral)   Ht '5\' 1"'$  (1.549 m)   Wt 172 lb 4 oz (78.1 kg)   SpO2 96%   BMI 32.55 kg/m   Wt Readings from Last 3 Encounters:  02/28/22 172 lb 4 oz (78.1 kg)  01/04/22 181 lb (82.1 kg)  10/18/21 181 lb 8 oz (82.3 kg)      Physical Exam Vitals and nursing note reviewed.  Constitutional:      General: She is not in acute distress.    Appearance: Normal appearance. She is well-developed. She is obese. She is not ill-appearing or toxic-appearing.  HENT:     Head: Normocephalic.     Right Ear: Hearing, tympanic membrane, ear canal and external ear normal.     Left Ear: Hearing, tympanic membrane, ear canal and external ear normal.     Nose: Nose normal.  Eyes:     General: Lids are normal. Lids are everted, no foreign bodies appreciated.     Conjunctiva/sclera: Conjunctivae normal.     Pupils: Pupils are equal, round, and reactive to light.  Neck:     Thyroid: No thyroid mass or thyromegaly.     Vascular: No carotid bruit.     Trachea: Trachea normal.  Cardiovascular:     Rate and Rhythm: Normal rate and regular rhythm.     Heart sounds: Normal heart sounds, S1 normal and S2 normal. No murmur heard.   No gallop.  Pulmonary:     Effort: Pulmonary effort is normal. No respiratory distress.     Breath sounds: Normal breath sounds. No wheezing, rhonchi or rales.  Abdominal:     General: Bowel sounds are normal. There is no distension or abdominal bruit.     Palpations: Abdomen is soft. There is no fluid wave or mass.     Tenderness: There is no abdominal tenderness. There is no guarding or rebound.     Hernia: No hernia is present.  Musculoskeletal:     Cervical back: Normal range of motion and neck supple.  Lymphadenopathy:     Cervical: No cervical adenopathy.  Skin:    General: Skin is warm and dry.     Findings: No rash.   Neurological:     Mental Status: She is alert.     Cranial Nerves: No cranial nerve deficit.     Sensory: No sensory deficit.  Psychiatric:        Mood and Affect: Mood is not anxious or depressed.        Speech: Speech normal.        Behavior: Behavior normal. Behavior is cooperative.        Judgment: Judgment normal.      Results for orders placed or performed in visit on 02/23/22  Vitamin  B12  Result Value Ref Range   Vitamin B-12 304 211 - 911 pg/mL  Comprehensive metabolic panel  Result Value Ref Range   Sodium 144 135 - 145 mEq/L   Potassium 3.7 3.5 - 5.1 mEq/L   Chloride 108 96 - 112 mEq/L   CO2 29 19 - 32 mEq/L   Glucose, Bld 85 70 - 99 mg/dL   BUN 18 6 - 23 mg/dL   Creatinine, Ser 0.72 0.40 - 1.20 mg/dL   Total Bilirubin 0.8 0.2 - 1.2 mg/dL   Alkaline Phosphatase 57 39 - 117 U/L   AST 13 0 - 37 U/L   ALT 10 0 - 35 U/L   Total Protein 6.2 6.0 - 8.3 g/dL   Albumin 3.8 3.5 - 5.2 g/dL   GFR 81.58 >60.00 mL/min   Calcium 8.9 8.4 - 10.5 mg/dL  Lipid panel  Result Value Ref Range   Cholesterol 117 0 - 200 mg/dL   Triglycerides 52.0 0.0 - 149.0 mg/dL   HDL 41.50 >39.00 mg/dL   VLDL 10.4 0.0 - 40.0 mg/dL   LDL Cholesterol 66 0 - 99 mg/dL   Total CHOL/HDL Ratio 3    NonHDL 75.97   Hemoglobin A1c  Result Value Ref Range   Hgb A1c MFr Bld 5.8 4.6 - 6.5 %     COVID 19 screen:  No recent travel or known exposure to COVID19 The patient denies respiratory symptoms of COVID 19 at this time. The importance of social distancing was discussed today.   Assessment and Plan   The patient's preventative maintenance and recommended screening tests for an annual wellness exam were reviewed in full today. Brought up to date unless services declined.  Counselled on the importance of diet, exercise, and its role in overall health and mortality. The patient's FH and SH was reviewed, including their home life, tobacco status, and drug and alcohol status.   Vaccines: uptodate  except shingles. COVID x4, postpone tetanus Pap/DVE: not indicated, s/p TAH, sister with ovarian cancer Mammo: personal hx of  breast cancer followed by ONC 06/2021 Bone Density: 2019, osteopenia.. repeat in 2024 Colon:  11/2021  1 polyp, no further needed. Smoking Status: former smoker, > 40 years in lung cancer screen protocol. ETOH/ drug use: No ETOH in years/ no smoking  Hep C:  done   Problem List Items Addressed This Visit     Anosmia    Acute, likely due to viral illness versus allergic rhinitis.  We will treat with Flonase 2 sprays per nostril daily.  If not improving can consider referral to ENT.       Aortic atherosclerosis (HCC)    LDL at goal less than 70 on atorvastatin 40 mg daily       Chronic constipation   Chronic diastolic heart failure (HCC)   COPD  GOLD 0 / AB     Chronic, stable control using albuterol as needed.       Ductal carcinoma in situ (DCIS) of right breast    Followed by oncology.  Currently actively treated with adjuvant anti estrogen therapy with Femara.       PREDIABETES   Primary hypertension    Chronic, resolved with weight changed on no medication.       PURE HYPERCHOLESTEROLEMIA    Chronic, well controlled LDL at goal less than 70 on atorvastatin 40 mg p.o. daily.       Vitamin B12 deficiency    Chronic, resolved with supplementation  Other Visit Diagnoses     Routine general medical examination at a health care facility    -  Primary        Eliezer Lofts, MD

## 2022-02-28 NOTE — Assessment & Plan Note (Signed)
LDL at goal less than 70 on atorvastatin 40 mg daily

## 2022-02-28 NOTE — Assessment & Plan Note (Signed)
Chronic, resolved with weight changed on no medication.

## 2022-02-28 NOTE — Assessment & Plan Note (Signed)
Chronic, stable control using albuterol as needed.

## 2022-02-28 NOTE — Patient Instructions (Signed)
Start flonase 2 spray per nostril daily x 2-4 week... if sense of smell if not improving.. call for a referral to ENT. Keep working on [employer] and regular exercise as tolerated.

## 2022-02-28 NOTE — Assessment & Plan Note (Signed)
Chronic, resolved with supplementation 

## 2022-02-28 NOTE — Assessment & Plan Note (Signed)
Chronic, well controlled LDL at goal less than 70 on atorvastatin 40 mg p.o. daily.

## 2022-03-06 DIAGNOSIS — H903 Sensorineural hearing loss, bilateral: Secondary | ICD-10-CM | POA: Diagnosis not present

## 2022-03-06 DIAGNOSIS — R430 Anosmia: Secondary | ICD-10-CM | POA: Diagnosis not present

## 2022-03-07 ENCOUNTER — Other Ambulatory Visit: Payer: Self-pay | Admitting: Unknown Physician Specialty

## 2022-03-07 DIAGNOSIS — R430 Anosmia: Secondary | ICD-10-CM

## 2022-03-07 DIAGNOSIS — H903 Sensorineural hearing loss, bilateral: Secondary | ICD-10-CM

## 2022-03-10 ENCOUNTER — Ambulatory Visit
Admission: RE | Admit: 2022-03-10 | Discharge: 2022-03-10 | Disposition: A | Discharge status: Home / Self Care | Payer: Medicare HMO | Source: Ambulatory Visit | Attending: Acute Care | Admitting: Acute Care

## 2022-03-10 DIAGNOSIS — Z87891 Personal history of nicotine dependence: Secondary | ICD-10-CM | POA: Diagnosis not present

## 2022-03-10 DIAGNOSIS — J439 Emphysema, unspecified: Secondary | ICD-10-CM | POA: Insufficient documentation

## 2022-03-10 DIAGNOSIS — I700 Atherosclerosis of aorta: Secondary | ICD-10-CM | POA: Insufficient documentation

## 2022-03-10 DIAGNOSIS — Z122 Encounter for screening for malignant neoplasm of respiratory organs: Secondary | ICD-10-CM | POA: Diagnosis not present

## 2022-03-10 DIAGNOSIS — R911 Solitary pulmonary nodule: Secondary | ICD-10-CM | POA: Diagnosis not present

## 2022-03-13 ENCOUNTER — Telehealth: Payer: Self-pay | Admitting: Acute Care

## 2022-03-13 NOTE — Telephone Encounter (Signed)
Received a call report from [employer] at [facility]:  IMPRESSION: 1. Lung-RADS 4A, suspicious. New 7.9 mm subpleural right upper lobe pulmonary nodule, potentially atelectasis. Follow up low-dose chest CT without contrast in 3 months (please use the following order, "CT CHEST LCS NODULE FOLLOW-UP W/O CM") is recommended. Alternatively, PET may be considered when there is a solid component 42m or larger. 2. Ascending thoracic aorta measures 4.1 cm diameter. Attention on follow-up lung cancer screening CT recommended. 3.  Emphysema (ICD10-J43.9) and Aortic Atherosclerosis (ICD10-170.0)  Forwarded to SEric Form NP

## 2022-03-17 ENCOUNTER — Telehealth: Payer: Self-pay | Admitting: Acute Care

## 2022-03-17 DIAGNOSIS — Z87891 Personal history of nicotine dependence: Secondary | ICD-10-CM

## 2022-03-17 DIAGNOSIS — R911 Solitary pulmonary nodule: Secondary | ICD-10-CM

## 2022-03-19 ENCOUNTER — Ambulatory Visit
Admission: RE | Admit: 2022-03-19 | Discharge: 2022-03-19 | Disposition: A | Discharge status: Home / Self Care | Payer: Medicare HMO | Source: Ambulatory Visit | Attending: Unknown Physician Specialty | Admitting: Unknown Physician Specialty

## 2022-03-19 DIAGNOSIS — I6782 Cerebral ischemia: Secondary | ICD-10-CM | POA: Diagnosis not present

## 2022-03-19 DIAGNOSIS — H903 Sensorineural hearing loss, bilateral: Secondary | ICD-10-CM

## 2022-03-19 DIAGNOSIS — R430 Anosmia: Secondary | ICD-10-CM

## 2022-03-19 MED ORDER — GADOBENATE DIMEGLUMINE 529 MG/ML IV SOLN
16.0000 mL | Freq: Once | INTRAVENOUS | Status: AC | PRN
  Administered 2022-03-19: 16 mL via INTRAVENOUS

## 2022-03-19 NOTE — Telephone Encounter (Signed)
Noted  

## 2022-03-20 ENCOUNTER — Telehealth: Payer: Self-pay | Admitting: Family Medicine

## 2022-03-20 NOTE — Telephone Encounter (Signed)
Patient had brain scan yesterday and she said she had contrast twice. She said last night she was throwing up and having bad diarrhea and had to take a shower and clean her sheets. She said shes feeling better now but would still like a call back at [phone]

## 2022-03-20 NOTE — Telephone Encounter (Signed)
I spoke with pt; pt said on 03/19/22 pt had MRI of head and was given contrast; pt said gave part of contrast medium and then took pictures and then gave the remainder of contrast; pt did not get 2 separate doses of contrast. During the night at 3 AM pt had one bad episode of vomiting and diarrhea where pt got on clothes and in bed. Did not last long but pt concerned. Pt said now no more diarrhea or vomiting but wanted Dr Ermalene Searing to be aware. Dr Ermalene Searing out of office and MRI ordered by Dr Linus Salmons. Warm transfer to Dr Loraine Grip office so they will be aware of diarrhea and vomiting episode and pt also wants to know if results are back from MRI. I spoke with  Shantee at [facility] ear nose and throat and gave above information and transferred pt. Will send note to  Dr Ermalene Searing who is out of office and Lupita Leash CMA as well as FYI.

## 2022-03-21 NOTE — Telephone Encounter (Signed)
Noted. No new recommendations. 

## 2022-04-13 ENCOUNTER — Encounter: Payer: Self-pay | Admitting: Podiatry

## 2022-04-13 ENCOUNTER — Ambulatory Visit: Payer: Medicare HMO | Admitting: Podiatry

## 2022-04-13 DIAGNOSIS — M79674 Pain in right toe(s): Secondary | ICD-10-CM | POA: Diagnosis not present

## 2022-04-13 DIAGNOSIS — B351 Tinea unguium: Secondary | ICD-10-CM

## 2022-04-13 DIAGNOSIS — M79675 Pain in left toe(s): Secondary | ICD-10-CM | POA: Diagnosis not present

## 2022-04-13 NOTE — Progress Notes (Signed)

## 2022-05-30 ENCOUNTER — Other Ambulatory Visit: Payer: Self-pay | Admitting: Internal Medicine

## 2022-06-22 ENCOUNTER — Telehealth: Payer: Self-pay | Admitting: *Deleted

## 2022-06-22 NOTE — Telephone Encounter (Signed)
Left message for patient to call back to schedule follow up lung cancer screening CT scan.  

## 2022-07-03 ENCOUNTER — Inpatient Hospital Stay: Payer: Medicare HMO | Attending: Hematology and Oncology | Admitting: Hematology and Oncology

## 2022-07-03 NOTE — Assessment & Plan Note (Deleted)
07/24/2017 left lumpectomy: IDC grade 2, 1.5 cm, intermediate grade DCIS, anterior margin broadly positive, posterior margin focally positive, lymphovascular invasion present, 0/2 lymph nodes negative T1CN0 stage I a  Right lumpectomy: DCIS grade 2 Tis NX stage 0 adjuvant radiation therapy 08/29/17- 10/01/17  Recommendations: Adjuvant antiestrogen therapy with Letrozole 2.5 mg dailystartedFebruary 1, 2019 She will take letrozole for 1 more year and then she will be done with her treatment.  Letrozoletoxicities:Denies any hot flashes or myalgias.  Breast cancer surveillance: 1.Breast exam10/05/2022 benign 2.Mammogram10/27/2022: Benign breast density category C I renewed her prescription for letrozole.  Severe constipation: Instructed her to take Colace once or twice a day for stool softener to help regulate her bowel movements. RTC in1 year for follow-up

## 2022-07-20 ENCOUNTER — Ambulatory Visit: Payer: Medicare HMO | Admitting: Podiatry

## 2022-07-20 ENCOUNTER — Encounter: Payer: Self-pay | Admitting: Podiatry

## 2022-07-20 DIAGNOSIS — M79674 Pain in right toe(s): Secondary | ICD-10-CM | POA: Diagnosis not present

## 2022-07-20 DIAGNOSIS — M79675 Pain in left toe(s): Secondary | ICD-10-CM | POA: Diagnosis not present

## 2022-07-20 DIAGNOSIS — B351 Tinea unguium: Secondary | ICD-10-CM

## 2022-07-20 NOTE — Progress Notes (Signed)

## 2022-07-22 DIAGNOSIS — Z1231 Encounter for screening mammogram for malignant neoplasm of breast: Secondary | ICD-10-CM | POA: Diagnosis not present

## 2022-07-22 LAB — HM MAMMOGRAPHY

## 2022-07-24 ENCOUNTER — Encounter: Payer: Self-pay | Admitting: Family Medicine

## 2022-07-25 ENCOUNTER — Telehealth: Payer: Self-pay | Admitting: Family Medicine

## 2022-07-25 ENCOUNTER — Ambulatory Visit (INDEPENDENT_AMBULATORY_CARE_PROVIDER_SITE_OTHER): Payer: Medicare HMO | Admitting: Family Medicine

## 2022-07-25 ENCOUNTER — Encounter: Payer: Self-pay | Admitting: Family Medicine

## 2022-07-25 VITALS — BP 110/60 | HR 60 | Temp 98.1°F | Ht 61.0 in | Wt 168.2 lb

## 2022-07-25 DIAGNOSIS — S51812D Laceration without foreign body of left forearm, subsequent encounter: Secondary | ICD-10-CM | POA: Diagnosis not present

## 2022-07-25 DIAGNOSIS — R300 Dysuria: Secondary | ICD-10-CM

## 2022-07-25 DIAGNOSIS — N3000 Acute cystitis without hematuria: Secondary | ICD-10-CM

## 2022-07-25 DIAGNOSIS — T148XXA Other injury of unspecified body region, initial encounter: Secondary | ICD-10-CM

## 2022-07-25 LAB — POC URINALSYSI DIPSTICK (AUTOMATED)
Bilirubin, UA: 1
Glucose, UA: NEGATIVE
Nitrite, UA: NEGATIVE
Protein, UA: POSITIVE — AB
Spec Grav, UA: >=1.03 — AB (ref 1.010–1.025)
Urobilinogen, UA: 0.2 E.U./dL
pH, UA: 5.5 (ref 5.0–8.0)

## 2022-07-25 MED ORDER — CEPHALEXIN 500 MG PO CAPS: 500.0000 mg | ORAL_CAPSULE | Freq: Three times a day (TID) | ORAL | 0 refills | Status: DC

## 2022-07-25 NOTE — Telephone Encounter (Signed)
Patient requested that Keflex be resent to [facility] in [place].  Prescription sent as requested.  Melanie Schneider notified of this via telephone.

## 2022-07-25 NOTE — Patient Instructions (Addendum)
Increase water intake.  We will call with urine culture.  Complete course of keflex for urinary tract infection.

## 2022-07-25 NOTE — Telephone Encounter (Signed)
Rx was [facility] to [facility] [place]

## 2022-07-25 NOTE — Assessment & Plan Note (Signed)
Acute, push fluids, send urine for culture and will treat with Keflex 500 mg 3 times daily x7 days

## 2022-07-25 NOTE — Progress Notes (Signed)
Patient ID: Melanie Schneider, female    DOB: 1945/10/18, 76 y.o.   MRN: [id number]  This visit was conducted in person.  BP 110/60   Pulse 60   Temp 98.1 F (36.7 C) (Oral)   Ht '5\' 1"'$  (1.549 m)   Wt 168 lb 4 oz (76.3 kg)   SpO2 96%   BMI 31.79 kg/m    CC:  Chief Complaint  Patient presents with   Dysuria   Abrasion    Left Arm 3 weeks ago ?Scratched by dog    Subjective:   HPI: Melanie Schneider is a 76 y.o. female presenting on 07/25/2022 for Dysuria and Abrasion (Left Arm 3 weeks ago ?Scratched by dog)     3 weeks ago she had abrasion on let arm from her puppies toenail. She washed area,  applied bandaid and antibiotic cream... she feels this irritated arm some.  Area is itchy. Clear discharge.  No known odor.  Redness spreading.  No fever.   She has noted dysuria 1 week ago. She has noted increase in frequency, only a few drops each urination. No abdominal pain, no fever, no new flank pain.   No antibitoics in alst month.       Relevant past medical, surgical, family and social history reviewed and updated as indicated. Interim medical history since our last visit reviewed. Allergies and medications reviewed and updated. Outpatient Medications Prior to Visit  Medication Sig Dispense Refill   atorvastatin (LIPITOR) 40 MG tablet Take 1 tablet by mouth once daily 90 tablet 2   letrozole (FEMARA) 2.5 MG tablet Take 1 tablet (2.5 mg total) by mouth daily. 90 tablet 3   MELATONIN ER PO Take 1-2 each by mouth at bedtime as needed.     polyethylene glycol powder (GLYCOLAX/MIRALAX) 17 GM/SCOOP powder 17gm mixed in liquid     vitamin B-12 (CYANOCOBALAMIN) 1000 MCG tablet Take 1,000 mcg by mouth daily.     clotrimazole-betamethasone (LOTRISONE) cream Apply 1 application. topically 2 (two) times daily. 30 g 0   Facility-Administered Medications Prior to Visit  Medication Dose Route Frequency Provider Last Rate Last Admin   SONAFINE emulsion 1 application   1 application  Topical BID Hayden Pedro, PA-C   1 application  at 28/78/67 1521     Per HPI unless specifically indicated in ROS section below Review of Systems  Constitutional:  Negative for fatigue and fever.  HENT:  Negative for congestion.   Eyes:  Negative for pain.  Respiratory:  Negative for cough and shortness of breath.   Cardiovascular:  Negative for chest pain, palpitations and leg swelling.  Gastrointestinal:  Negative for abdominal pain.  Genitourinary:  Negative for dysuria and vaginal bleeding.  Musculoskeletal:  Negative for back pain.  Neurological:  Negative for syncope, light-headedness and headaches.  Psychiatric/Behavioral:  Negative for dysphoric mood.    Objective:  BP 110/60   Pulse 60   Temp 98.1 F (36.7 C) (Oral)   Ht '5\' 1"'$  (1.549 m)   Wt 168 lb 4 oz (76.3 kg)   SpO2 96%   BMI 31.79 kg/m   Wt Readings from Last 3 Encounters:  07/25/22 168 lb 4 oz (76.3 kg)  03/10/22 173 lb (78.5 kg)  02/28/22 172 lb 4 oz (78.1 kg)      Physical Exam Constitutional:      General: She is not in acute distress.    Appearance: Normal appearance. She is well-developed. She is not ill-appearing or toxic-appearing.  HENT:     Head: Normocephalic.     Right Ear: Hearing, tympanic membrane, ear canal and external ear normal. Tympanic membrane is not erythematous, retracted or bulging.     Left Ear: Hearing, tympanic membrane, ear canal and external ear normal. Tympanic membrane is not erythematous, retracted or bulging.     Nose: No mucosal edema or rhinorrhea.     Right Sinus: No maxillary sinus tenderness or frontal sinus tenderness.     Left Sinus: No maxillary sinus tenderness or frontal sinus tenderness.     Mouth/Throat:     Pharynx: Uvula midline.  Eyes:     General: Lids are normal. Lids are everted, no foreign bodies appreciated.     Conjunctiva/sclera: Conjunctivae normal.     Pupils: Pupils are equal, round, and reactive to light.  Neck:      Thyroid: No thyroid mass or thyromegaly.     Vascular: No carotid bruit.     Trachea: Trachea normal.  Cardiovascular:     Rate and Rhythm: Normal rate and regular rhythm.     Pulses: Normal pulses.     Heart sounds: Normal heart sounds, S1 normal and S2 normal. No murmur heard.    No friction rub. No gallop.  Pulmonary:     Effort: Pulmonary effort is normal. No tachypnea or respiratory distress.     Breath sounds: Normal breath sounds. No decreased breath sounds, wheezing, rhonchi or rales.  Abdominal:     General: Bowel sounds are normal.     Palpations: Abdomen is soft.     Tenderness: There is no abdominal tenderness.  Musculoskeletal:     Cervical back: Normal range of motion and neck supple.  Skin:    General: Skin is warm and dry.     Findings: No rash.     Comments: Healing laceration on left forearm with dry flaky skin and surrounding erythema, no warmth  Neurological:     Mental Status: She is alert.  Psychiatric:        Mood and Affect: Mood is not anxious or depressed.        Speech: Speech normal.        Behavior: Behavior normal. Behavior is cooperative.        Thought Content: Thought content normal.        Judgment: Judgment normal.       Results for orders placed or performed in visit on 07/25/22  POCT Urinalysis Dipstick (Automated)  Result Value Ref Range   Color, UA Yellow    Clarity, UA Clear    Glucose, UA Negative Negative   Bilirubin, UA 1 mg/dl (1+)    Ketones, UA Trace ('5mg'$ /dl)    Spec Grav, UA >=1.030 (A) 1.010 - 1.025   Blood, UA Small (1+)    pH, UA 5.5 5.0 - 8.0   Protein, UA Positive (A) Negative   Urobilinogen, UA 0.2 0.2 or 1.0 E.U./dL   Nitrite, UA Negative    Leukocytes, UA Large (3+) (A) Negative     COVID 19 screen:  No recent travel or known exposure to COVID19 The patient denies respiratory symptoms of COVID 19 at this time. The importance of social distancing was discussed today.   Assessment and Plan    Problem List  Items Addressed This Visit     Acute cystitis without hematuria    Acute, push fluids, send urine for culture and will treat with Keflex 500 mg 3 times daily x7 days  Skin abrasion    Acute Well-healing with possible associated allergic reaction to bandage.  No clear bacterial infection.  Will treat with Keflex to cover both UTI as well as possible mild cellulitis at laceration site. Reviewed skin care and methods to avoid Band-Aid.      Other Visit Diagnoses     Dysuria    -  Primary   Relevant Orders   POCT Urinalysis Dipstick (Automated) (Completed)   Urine Culture        Eliezer Lofts, MD

## 2022-07-25 NOTE — Telephone Encounter (Signed)
Pt called in stated [facility] not have RX cephALEXin (KEFLEX) 500 MG capsule . Wants to know can she get a new prescription or can it be called in to a different Pharmacy . Please advise [phone]

## 2022-07-25 NOTE — Assessment & Plan Note (Signed)
Acute Well-healing with possible associated allergic reaction to bandage.  No clear bacterial infection.  Will treat with Keflex to cover both UTI as well as possible mild cellulitis at laceration site. Reviewed skin care and methods to avoid Band-Aid.

## 2022-07-25 NOTE — Addendum Note (Signed)
Addended by: Carter Kitten on: 07/25/2022 02:46 PM   Modules accepted: Orders

## 2022-07-25 NOTE — Telephone Encounter (Signed)
Pt stated she went to pick up the meds, cephALEXin (KEFLEX) 500 MG capsule but they are out of stock for the next 3 days. Pt is wondering [facility] [place]?

## 2022-07-28 LAB — URINE CULTURE
MICRO NUMBER:: 14124393
SPECIMEN QUALITY:: ADEQUATE

## 2022-08-08 ENCOUNTER — Other Ambulatory Visit: Payer: Self-pay | Admitting: Hematology and Oncology

## 2022-08-25 NOTE — Progress Notes (Signed)
Patient Care Team: Jinny Sanders, MD as PCP - General Erroll Luna, MD as Consulting Physician (General Surgery) Nicholas Lose, MD as Consulting Physician (Hematology and Oncology) Kyung Rudd, MD as Consulting Physician (Radiation Oncology) Delice Bison Charlestine Massed, NP as Nurse Practitioner (Hematology and Oncology)  DIAGNOSIS: No diagnosis found.  SUMMARY OF ONCOLOGIC HISTORY: Oncology History  Ductal carcinoma in situ (DCIS) of right breast  06/28/2017 Initial Diagnosis   Ductal carcinoma in situ (DCIS) of right breast   07/24/2017 Surgery   Right lumpectomy: DCIS intermediate grade 0.6 cm, Tis NX stage 0   08/29/2017 - 10/01/2017 Radiation Therapy   Adjuvant radiation therapy   History of breast cancer  06/26/2017 Initial Diagnosis   Screening detected right breast calcifications and architectural distortion on the left breast; right breast calcs 1.1 cm normal biopsy DCIS ER 90%, PR 95%, grade 1-2; Tis Nx stage 0 left breast distortion 1.8 cm mass by ultrasound biopsy: IDC with DCIS ER 95%, PR 5%, Ki-67 15%, HER-2 negative ratio 1.39, grade 1-2, T1c N0 stage IA clinical stage   07/24/2017 Surgery   Left lumpectomy: IDC grade 2, 1.5 cm, intermediate grade DCIS, anterior margin broadly positive, posterior margin focally positive, lymphovascular invasion present, 0/2 lymph nodes negative T1CN0 stage I a   08/29/2017 - 10/01/2017 Radiation Therapy   Adjuvant radiation therapy   10/2017 -  Anti-estrogen oral therapy   Letrozole daily     CHIEF COMPLIANT:   INTERVAL HISTORY: Melanie Schneider is a   ALLERGIES:  is allergic to iodine, other, povidone-iodine, wound dressing adhesive, adhesive [tape], and latex.  MEDICATIONS:  Current Outpatient Medications  Medication Sig Dispense Refill   atorvastatin (LIPITOR) 40 MG tablet Take 1 tablet by mouth once daily 90 tablet 2   cephALEXin (KEFLEX) 500 MG capsule Take 1 capsule (500 mg total) by mouth 3 (three) times daily. 21  capsule 0   letrozole (FEMARA) 2.5 MG tablet Take 1 tablet by mouth once daily 30 tablet 0   MELATONIN ER PO Take 1-2 each by mouth at bedtime as needed.     polyethylene glycol powder (GLYCOLAX/MIRALAX) 17 GM/SCOOP powder 17gm mixed in liquid     vitamin B-12 (CYANOCOBALAMIN) 1000 MCG tablet Take 1,000 mcg by mouth daily.     No current facility-administered medications for this visit.   Facility-Administered Medications Ordered in Other Visits  Medication Dose Route Frequency Provider Last Rate Last Admin   SONAFINE emulsion 1 application  1 application  Topical BID Hayden Pedro, PA-C   1 application  at 49/70/26 1521    PHYSICAL EXAMINATION: ECOG PERFORMANCE STATUS: {CHL ONC ECOG [id number]}  There were no vitals filed for this visit. There were no vitals filed for this visit.  BREAST:*** No palpable masses or nodules in either right or left breasts. No palpable axillary supraclavicular or infraclavicular adenopathy no breast tenderness or nipple discharge. (exam performed in the presence of a chaperone)  LABORATORY DATA:  I have reviewed the data as listed    Latest Ref Rng & Units 02/23/2022    8:48 AM 02/22/2021   10:11 AM 10/20/2019   10:20 AM  CMP  Glucose 70 - 99 mg/dL 85  104  102   BUN 6 - 23 mg/dL _0 Creatinine 0.40 - 1.20 mg/dL 0.72  0.72  0.56   Sodium 135 - 145 mEq/L 144  144  140   Potassium 3.5 - 5.1 mEq/L 3.7  4.5  3.9  Chloride 96 - 112 mEq/L 108  109  106   CO2 19 - 32 mEq/L _0 Calcium 8.4 - 10.5 mg/dL 8.9  9.4  9.0   Total Protein 6.0 - 8.3 g/dL 6.2  6.6  6.3   Total Bilirubin 0.2 - 1.2 mg/dL 0.8  0.8  0.6   Alkaline Phos 39 - 117 U/L 57  58  58   AST 0 - 37 U/L _1 ALT 0 - 35 U/L _2 Lab Results  Component Value Date   WBC 7.6 07/04/2017   HGB 12.6 07/04/2017   HCT 38.9 07/04/2017   MCV 85.2 07/04/2017   PLT 267 07/04/2017   NEUTROABS 5.0 07/04/2017    ASSESSMENT & PLAN:  No problem-specific  Assessment & Plan notes found for this encounter.    No orders of the defined types were placed in this encounter.  The patient has a good understanding of the overall plan. she agrees with it. she will call with any problems that may develop before the next visit here. Total time spent: 30 mins including face to face time and time spent for planning, charting and co-ordination of care   Suzzette Righter, [place] 08/25/22    I Gardiner Coins am scribing for Dr. Lindi Adie  ***

## 2022-08-28 ENCOUNTER — Other Ambulatory Visit: Payer: Self-pay

## 2022-08-28 ENCOUNTER — Inpatient Hospital Stay: Payer: Medicare HMO | Attending: Hematology and Oncology | Admitting: Hematology and Oncology

## 2022-08-28 VITALS — BP 142/78 | HR 62 | Temp 97.8°F | Resp 18 | Ht 61.0 in | Wt 168.4 lb

## 2022-08-28 DIAGNOSIS — Z170 Estrogen receptor positive status [ER+]: Secondary | ICD-10-CM | POA: Diagnosis not present

## 2022-08-28 DIAGNOSIS — Z79811 Long term (current) use of aromatase inhibitors: Secondary | ICD-10-CM | POA: Diagnosis not present

## 2022-08-28 DIAGNOSIS — C50212 Malignant neoplasm of upper-inner quadrant of left female breast: Secondary | ICD-10-CM | POA: Diagnosis not present

## 2022-08-28 NOTE — Assessment & Plan Note (Addendum)
07/24/2017 left lumpectomy: IDC grade 2, 1.5 cm, intermediate grade DCIS, anterior margin broadly positive, posterior margin focally positive, lymphovascular invasion present, 0/2 lymph nodes negative T1CN0 stage I a   Right lumpectomy: DCIS grade 2 Tis NX stage 0 adjuvant radiation therapy 08/29/17- 10/01/17   Recommendations: Adjuvant antiestrogen therapy with Letrozole 2.5 mg daily started October 26, 2017 I instructed her to discontinue her antiestrogen therapy once she runs out of her current prescription.   Letrozole toxicities: Denies any hot flashes or myalgias.   Breast cancer surveillance: 1.  Breast exam 08/28/2022: benign 2. Mammogram [facility] 07/22/2022: Benign breast density category C   Severe constipation: On MiraLAX RTC on an as-needed basis.

## 2022-09-05 ENCOUNTER — Ambulatory Visit
Admission: RE | Admit: 2022-09-05 | Discharge: 2022-09-05 | Disposition: A | Discharge status: Home / Self Care | Payer: Medicare HMO | Source: Ambulatory Visit | Attending: Acute Care | Admitting: Acute Care

## 2022-09-05 DIAGNOSIS — I2510 Atherosclerotic heart disease of native coronary artery without angina pectoris: Secondary | ICD-10-CM | POA: Diagnosis not present

## 2022-09-05 DIAGNOSIS — Z87891 Personal history of nicotine dependence: Secondary | ICD-10-CM | POA: Diagnosis present

## 2022-09-05 DIAGNOSIS — I714 Abdominal aortic aneurysm, without rupture, unspecified: Secondary | ICD-10-CM | POA: Insufficient documentation

## 2022-09-05 DIAGNOSIS — I700 Atherosclerosis of aorta: Secondary | ICD-10-CM | POA: Insufficient documentation

## 2022-09-05 DIAGNOSIS — R911 Solitary pulmonary nodule: Secondary | ICD-10-CM | POA: Diagnosis present

## 2022-09-07 ENCOUNTER — Telehealth: Payer: Self-pay

## 2022-09-07 ENCOUNTER — Other Ambulatory Visit: Payer: Self-pay

## 2022-09-07 DIAGNOSIS — Z87891 Personal history of nicotine dependence: Secondary | ICD-10-CM

## 2022-09-07 DIAGNOSIS — Z122 Encounter for screening for malignant neoplasm of respiratory organs: Secondary | ICD-10-CM

## 2022-09-07 NOTE — Telephone Encounter (Signed)
Patient returned call to review results of LDCT nodule follow up.  Area of previous concern has resolved.  No suspicious findings for lung cancer at this time. Will return patient to annual LDCT screening for 2024.  Patient acknowledged understanding and had no further questions.  Order placed and results faxed to PCP

## 2022-09-07 NOTE — Telephone Encounter (Signed)
Left Vm for patient to call for results of recent [facility] LDCT

## 2022-09-08 ENCOUNTER — Encounter: Payer: Self-pay | Admitting: Family Medicine

## 2022-09-08 DIAGNOSIS — I7121 Aneurysm of the ascending aorta, without rupture: Secondary | ICD-10-CM | POA: Insufficient documentation

## 2022-09-20 NOTE — Progress Notes (Unsigned)
    Karem Farha T. Maxey Ransom, MD, [place] at [facility] [place], [address]  Phone: [phone]  FAX: [place] - 76 y.o. female  MRN [id number]  Date of Birth: March 29, 1946  Date: 09/21/2022  PCP: Jinny Sanders, MD  Referral: Jinny Sanders, MD  No chief complaint on file.  Subjective:   Melanie Schneider is a 76 y.o. very pleasantt female patient with There is no height or weight on file to calculate BMI. who presents with the following:  Patient presents with dysuria and possible UTI symptoms.    Review of Systems is noted in the HPI, as appropriate  Objective:   There were no vitals taken for this visit.  GEN: No acute distress; alert,appropriate. PULM: Breathing comfortably in no respiratory distress PSYCH: Normally interactive.   Laboratory and Imaging Data:  Assessment and Plan:   ***

## 2022-09-21 ENCOUNTER — Encounter: Payer: Self-pay | Admitting: Family Medicine

## 2022-09-21 ENCOUNTER — Ambulatory Visit (INDEPENDENT_AMBULATORY_CARE_PROVIDER_SITE_OTHER): Payer: Medicare HMO | Admitting: Family Medicine

## 2022-09-21 VITALS — BP 120/70 | HR 73 | Temp 98.1°F | Ht 61.0 in | Wt 165.2 lb

## 2022-09-21 DIAGNOSIS — N3001 Acute cystitis with hematuria: Secondary | ICD-10-CM

## 2022-09-21 DIAGNOSIS — R300 Dysuria: Secondary | ICD-10-CM | POA: Diagnosis not present

## 2022-09-21 LAB — POC URINALSYSI DIPSTICK (AUTOMATED)
Bilirubin, UA: NEGATIVE
Glucose, UA: NEGATIVE
Ketones, UA: NEGATIVE
Nitrite, UA: POSITIVE
Protein, UA: POSITIVE — AB
Spec Grav, UA: 1.015 (ref 1.010–1.025)
Urobilinogen, UA: 0.2 E.U./dL
pH, UA: 6.5 (ref 5.0–8.0)

## 2022-09-21 MED ORDER — NITROFURANTOIN MONOHYD MACRO 100 MG PO CAPS: 100.0000 mg | ORAL_CAPSULE | Freq: Two times a day (BID) | ORAL | 0 refills | Status: DC

## 2022-09-22 LAB — URINE CULTURE
MICRO NUMBER:: 14365442
SPECIMEN QUALITY:: ADEQUATE

## 2022-10-17 DIAGNOSIS — E785 Hyperlipidemia, unspecified: Secondary | ICD-10-CM | POA: Diagnosis not present

## 2022-10-17 DIAGNOSIS — K08409 Partial loss of teeth, unspecified cause, unspecified class: Secondary | ICD-10-CM | POA: Diagnosis not present

## 2022-10-17 DIAGNOSIS — F1021 Alcohol dependence, in remission: Secondary | ICD-10-CM | POA: Diagnosis not present

## 2022-10-17 DIAGNOSIS — Z87891 Personal history of nicotine dependence: Secondary | ICD-10-CM | POA: Diagnosis not present

## 2022-10-17 DIAGNOSIS — H9193 Unspecified hearing loss, bilateral: Secondary | ICD-10-CM | POA: Diagnosis not present

## 2022-10-17 DIAGNOSIS — E669 Obesity, unspecified: Secondary | ICD-10-CM | POA: Diagnosis not present

## 2022-10-17 DIAGNOSIS — Z823 Family history of stroke: Secondary | ICD-10-CM | POA: Diagnosis not present

## 2022-10-17 DIAGNOSIS — J449 Chronic obstructive pulmonary disease, unspecified: Secondary | ICD-10-CM | POA: Diagnosis not present

## 2022-10-17 DIAGNOSIS — R32 Unspecified urinary incontinence: Secondary | ICD-10-CM | POA: Diagnosis not present

## 2022-10-17 DIAGNOSIS — Z803 Family history of malignant neoplasm of breast: Secondary | ICD-10-CM | POA: Diagnosis not present

## 2022-10-17 DIAGNOSIS — M810 Age-related osteoporosis without current pathological fracture: Secondary | ICD-10-CM | POA: Diagnosis not present

## 2022-10-17 DIAGNOSIS — K5900 Constipation, unspecified: Secondary | ICD-10-CM | POA: Diagnosis not present

## 2022-10-17 DIAGNOSIS — R69 Illness, unspecified: Secondary | ICD-10-CM | POA: Diagnosis not present

## 2022-10-26 ENCOUNTER — Ambulatory Visit: Payer: Medicare HMO | Admitting: Podiatry

## 2022-11-13 ENCOUNTER — Ambulatory Visit: Payer: Medicare HMO | Admitting: Podiatry

## 2022-11-13 ENCOUNTER — Encounter: Payer: Self-pay | Admitting: Podiatry

## 2022-11-13 VITALS — BP 132/71 | HR 70

## 2022-11-13 DIAGNOSIS — B351 Tinea unguium: Secondary | ICD-10-CM

## 2022-11-13 DIAGNOSIS — M79674 Pain in right toe(s): Secondary | ICD-10-CM | POA: Diagnosis not present

## 2022-11-13 DIAGNOSIS — M79675 Pain in left toe(s): Secondary | ICD-10-CM

## 2022-11-14 NOTE — Progress Notes (Signed)
  Subjective:  Patient ID: Melanie Schneider, female    DOB: October 07, 1945,  MRN: [id number]  Chief Complaint  Patient presents with   Nail Problem    "My toenails"    77 y.o. female presents with the above complaint. History confirmed with patient.  Her nails are thickened elongated crumbly and she is unable to cut them, causing pain in shoes  Objective:  Physical Exam: warm, good capillary refill, no trophic changes or ulcerative lesions, normal DP and PT pulses, and normal sensory exam. Left Foot: dystrophic yellowed discolored nail plates with subungual debris Right Foot: dystrophic yellowed discolored nail plates with subungual debris  Assessment:   1. Pain due to onychomycosis of toenails of both feet      Plan:  Patient was evaluated and treated and all questions answered.  Discussed the etiology and treatment options for the condition in detail with the patient. Educated patient on the topical and oral treatment options for mycotic nails. Recommended debridement of the nails today. Sharp and mechanical debridement performed of all painful and mycotic nails today. Nails debrided in length and thickness using a nail nipper to level of comfort. Discussed treatment options including appropriate shoe gear. Follow up as needed for painful nails.    Return in about 3 months (around 02/11/2023) for painful thick fungal nails.

## 2022-12-26 ENCOUNTER — Encounter: Payer: Self-pay | Admitting: Family Medicine

## 2022-12-26 ENCOUNTER — Ambulatory Visit (INDEPENDENT_AMBULATORY_CARE_PROVIDER_SITE_OTHER): Payer: Medicare HMO | Admitting: Family Medicine

## 2022-12-26 VITALS — BP 128/72 | HR 72 | Temp 97.8°F | Ht 61.0 in | Wt 162.0 lb

## 2022-12-26 DIAGNOSIS — N3000 Acute cystitis without hematuria: Secondary | ICD-10-CM | POA: Diagnosis not present

## 2022-12-26 DIAGNOSIS — R300 Dysuria: Secondary | ICD-10-CM

## 2022-12-26 LAB — POC URINALSYSI DIPSTICK (AUTOMATED)
Bilirubin, UA: 1
Blood, UA: NEGATIVE
Glucose, UA: NEGATIVE
Ketones, UA: 5
Nitrite, UA: POSITIVE — AB
Protein, UA: POSITIVE — AB
Spec Grav, UA: 1.025 (ref 1.010–1.025)
Urobilinogen, UA: 1 E.U./dL
pH, UA: 6 (ref 5.0–8.0)

## 2022-12-26 MED ORDER — CEPHALEXIN 500 MG PO CAPS: 500.0000 mg | ORAL_CAPSULE | Freq: Three times a day (TID) | ORAL | 0 refills | Status: DC

## 2022-12-26 NOTE — Assessment & Plan Note (Addendum)
Symptoms for a week  Pos ua with leuk and nitrite Cx pending  Last uti in fall was pan sensitive e coli (reviewed notes, ua and cx)  Urinary symptoms in December yielded neg culture (reve notes, ua and cx for that as well and d/w pt who had questions)  Disc fluid intake  Keflex sent to pharmacy pend cx Inst to call if worse before that returns  ER precautions noted Enc to avoid acidic beverages and artificial sweetener for now as well   Meds ordered this encounter  Medications   cephALEXin (KEFLEX) 500 MG capsule    Sig: Take 1 capsule (500 mg total) by mouth 3 (three) times daily.    Dispense:  21 capsule    Refill:  0    Handout given

## 2022-12-26 NOTE — Patient Instructions (Signed)
Drink lots of water  Take the generic keflex as directed   We will contact you with the urine culture result    In the meantime if your symptoms worsen let us know
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
## 2022-12-27 LAB — URINE CULTURE
MICRO NUMBER:: 14771912
SPECIMEN QUALITY:: ADEQUATE

## 2023-01-10 ENCOUNTER — Ambulatory Visit (INDEPENDENT_AMBULATORY_CARE_PROVIDER_SITE_OTHER): Payer: Medicare HMO

## 2023-01-10 VITALS — Ht 63.0 in | Wt 162.0 lb

## 2023-01-10 DIAGNOSIS — Z0000 Encounter for general adult medical examination without abnormal findings: Secondary | ICD-10-CM | POA: Diagnosis not present
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
## 2023-01-10 NOTE — Patient Instructions (Signed)
Melanie Schneider , Thank you for taking time to come for your Medicare Wellness Visit. I appreciate your ongoing commitment to your health goals. Please review the following plan we discussed and let me know if I can assist you in the future.   These are the goals we discussed:  Goals      Exercise 150 min/wk Moderate Activity     Try to move more, try chair exercises. Sell property and move to smaller home, closer to [place].     Follow up with Primary Care Provider     Starting 10/17/2018, I will continue to take medications as prescribed and to keep appointments with PCP as scheduled.      Patient Stated     10/20/2019, I will continue to take medications as prescribed and to keep appointments with PCP as scheduled.     Patient Stated     Wants to drink more water        This is a list of the screening recommended for you and due dates:  Health Maintenance  Topic Date Due   DTaP/Tdap/Td vaccine (3 - Tdap) 05/27/2019   COVID-19 Vaccine (8 - 2023-24 season) 01/25/2023   Flu Shot  04/26/2023   DEXA scan (bone density measurement)  06/25/2023   Mammogram  07/23/2023   Screening for Lung Cancer  09/06/2023   Medicare Annual Wellness Visit  01/10/2024   Pneumonia Vaccine  Completed   Hepatitis C Screening: USPSTF Recommendation to screen - Ages 18-79 yo.  Completed   Zoster (Shingles) Vaccine  Completed   HPV Vaccine  Aged Out   Colon Cancer Screening  Discontinued    Advanced directives:   Patient to bring copy of living will  Conditions/risks identified: Keep up the good work  Next appointment: Follow up in one year for your annual wellness visit: 01/14/24   Preventive Care 65 Years and Older, Female Preventive care refers to lifestyle choices and visits with your health care provider that can promote health and wellness. What does preventive care include? A yearly physical exam. This is also called an annual well check. Dental exams once or twice a year. Routine eye exams. Ask  your health care provider how often you should have your eyes checked. Personal lifestyle choices, including: Daily care of your teeth and gums. Regular physical activity. Eating a healthy diet. Avoiding tobacco and drug use. Limiting alcohol use. Practicing safe sex. Taking low-dose aspirin every day. Taking vitamin and mineral supplements as recommended by your health care provider. What happens during an annual well check? The services and screenings done by your health care provider during your annual well check will depend on your age, overall health, lifestyle risk factors, and family history of disease. Counseling  Your health care provider may ask you questions about your: Alcohol use. Tobacco use. Drug use. Emotional well-being. Home and relationship well-being. Sexual activity. Eating habits. History of falls. Memory and ability to understand (cognition). Work and work Astronomer. Reproductive health. Screening  You may have the following tests or measurements: Height, weight, and BMI. Blood pressure. Lipid and cholesterol levels. These may be checked every 5 years, or more frequently if you are over 74 years old. Skin check. Lung cancer screening. You may have this screening every year starting at age 24 if you have a 30-pack-year history of smoking and currently smoke or have quit within the past 15 years. Fecal occult blood test (FOBT) of the stool. You may have this test every year starting  at age 66. Flexible sigmoidoscopy or colonoscopy. You may have a sigmoidoscopy every 5 years or a colonoscopy every 10 years starting at age 20. Hepatitis C blood test. Hepatitis B blood test. Sexually transmitted disease (STD) testing. Diabetes screening. This is done by checking your blood sugar (glucose) after you have not eaten for a while (fasting). You may have this done every 1-3 years. Bone density scan. This is done to screen for osteoporosis. You may have this done  starting at age 13. Mammogram. This may be done every 1-2 years. Talk to your health care provider about how often you should have regular mammograms. Talk with your health care provider about your test results, treatment options, and if necessary, the need for more tests. Vaccines  Your health care provider may recommend certain vaccines, such as: Influenza vaccine. This is recommended every year. Tetanus, diphtheria, and acellular pertussis (Tdap, Td) vaccine. You may need a Td booster every 10 years. Zoster vaccine. You may need this after age 38. Pneumococcal 13-valent conjugate (PCV13) vaccine. One dose is recommended after age 54. Pneumococcal polysaccharide (PPSV23) vaccine. One dose is recommended after age 73. Talk to your health care provider about which screenings and vaccines you need and how often you need them. This information is not intended to replace advice given to you by your health care provider. Make sure you discuss any questions you have with your health care provider. Document Released: 10/08/2015 Document Revised: 05/31/2016 Document Reviewed: 07/13/2015 Elsevier Interactive Patient Education  2017 [employer] Prevention in the Home Falls can cause injuries. They can happen to people of all ages. There are many things you can do to make your home safe and to help prevent falls. What can I do on the outside of my home? Regularly fix the edges of walkways and driveways and fix any cracks. Remove anything that might make you trip as you walk through a door, such as a raised step or threshold. Trim any bushes or trees on the path to your home. Use bright outdoor lighting. Clear any walking paths of anything that might make someone trip, such as rocks or tools. Regularly check to see if handrails are loose or broken. Make sure that both sides of any steps have handrails. Any raised decks and porches should have guardrails on the edges. Have any leaves, snow, or  ice cleared regularly. Use sand or salt on walking paths during winter. Clean up any spills in your garage right away. This includes oil or grease spills. What can I do in the bathroom? Use night lights. Install grab bars by the toilet and in the tub and shower. Do not use towel bars as grab bars. Use non-skid mats or decals in the tub or shower. If you need to sit down in the shower, use a plastic, non-slip stool. Keep the floor dry. Clean up any water that spills on the floor as soon as it happens. Remove soap buildup in the tub or shower regularly. Attach bath mats securely with double-sided non-slip rug tape. Do not have throw rugs and other things on the floor that can make you trip. What can I do in the bedroom? Use night lights. Make sure that you have a light by your bed that is easy to reach. Do not use any sheets or blankets that are too big for your bed. They should not hang down onto the floor. Have a firm chair that has side arms. You can use this for support  while you get dressed. Do not have throw rugs and other things on the floor that can make you trip. What can I do in the kitchen? Clean up any spills right away. Avoid walking on wet floors. Keep items that you use a lot in easy-to-reach places. If you need to reach something above you, use a strong step stool that has a grab bar. Keep electrical cords out of the way. Do not use floor polish or wax that makes floors slippery. If you must use wax, use non-skid floor wax. Do not have throw rugs and other things on the floor that can make you trip. What can I do with my stairs? Do not leave any items on the stairs. Make sure that there are handrails on both sides of the stairs and use them. Fix handrails that are broken or loose. Make sure that handrails are as long as the stairways. Check any carpeting to make sure that it is firmly attached to the stairs. Fix any carpet that is loose or worn. Avoid having throw rugs at  the top or bottom of the stairs. If you do have throw rugs, attach them to the floor with carpet tape. Make sure that you have a light switch at the top of the stairs and the bottom of the stairs. If you do not have them, ask someone to add them for you. What else can I do to help prevent falls? Wear shoes that: Do not have high heels. Have rubber bottoms. Are comfortable and fit you well. Are closed at the toe. Do not wear sandals. If you use a stepladder: Make sure that it is fully opened. Do not climb a closed stepladder. Make sure that both sides of the stepladder are locked into place. Ask someone to hold it for you, if possible. Clearly mark and make sure that you can see: Any grab bars or handrails. First and last steps. Where the edge of each step is. Use tools that help you move around (mobility aids) if they are needed. These include: Canes. Walkers. Scooters. Crutches. Turn on the lights when you go into a dark area. Replace any light bulbs as soon as they burn out. Set up your furniture so you have a clear path. Avoid moving your furniture around. If any of your floors are uneven, fix them. If there are any pets around you, be aware of where they are. Review your medicines with your doctor. Some medicines can make you feel dizzy. This can increase your chance of falling. Ask your doctor what other things that you can do to help prevent falls. This information is not intended to replace advice given to you by your health care provider. Make sure you discuss any questions you have with your health care provider. Document Released: 07/08/2009 Document Revised: 02/17/2016 Document Reviewed: 10/16/2014 Elsevier Interactive Patient Education  2017 [employer].

## 2023-01-22 ENCOUNTER — Telehealth: Payer: Self-pay | Admitting: Family Medicine

## 2023-01-22 NOTE — Telephone Encounter (Signed)
Handicap Placard Application placed in Dr. Bedsole's office in box to complete. 

## 2023-01-22 NOTE — Telephone Encounter (Signed)
Patient dropped off document Handicap Placard, to be filled out by provider. Patient requested to send it via Call Patient to pick up within 2-days. Document is located in providers tray at front office.Please advise at [facility] [phone]

## 2023-01-24 NOTE — Telephone Encounter (Signed)
Form in my outbox.

## 2023-01-24 NOTE — Telephone Encounter (Signed)
Patient came by the office regarding this form, wanting to know it has been completed. Patient was told the form was given to Dr. Ermalene Searing but does not seem to be completed yet. Told patient I would send a message to ask when this is expected to be completed and to ask if she could receive a call when it was complete. [phone]

## 2023-01-24 NOTE — Telephone Encounter (Signed)
Melanie Schneider notified by telephone that her handicap placard application is ready to be picked up at the front desk.

## 2023-02-06 ENCOUNTER — Ambulatory Visit (INDEPENDENT_AMBULATORY_CARE_PROVIDER_SITE_OTHER): Payer: Medicare HMO | Admitting: Family Medicine

## 2023-02-06 ENCOUNTER — Encounter: Payer: Self-pay | Admitting: Family Medicine

## 2023-02-06 VITALS — BP 124/70 | HR 62 | Temp 97.3°F | Ht 63.0 in | Wt 161.0 lb

## 2023-02-06 DIAGNOSIS — R300 Dysuria: Secondary | ICD-10-CM

## 2023-02-06 DIAGNOSIS — R52 Pain, unspecified: Secondary | ICD-10-CM

## 2023-02-06 LAB — POC URINALSYSI DIPSTICK (AUTOMATED)
Blood, UA: NEGATIVE
Glucose, UA: NEGATIVE
Nitrite, UA: POSITIVE
Protein, UA: POSITIVE — AB
Spec Grav, UA: 1.015 (ref 1.010–1.025)
Urobilinogen, UA: 1 E.U./dL
pH, UA: 6 (ref 5.0–8.0)

## 2023-02-06 NOTE — Progress Notes (Signed)
Ph: [phone] Fax: [phone]   Patient ID: Melanie Schneider, female    DOB: Apr 21, 1946, 77 y.o.   MRN: [id number]  This visit was conducted in person.  BP 124/70   Pulse 62   Temp (!) 97.3 F (36.3 C) (Temporal)   Ht 5\' 3"  (1.6 m)   Wt 161 lb (73 kg)   SpO2 97%   BMI 28.52 kg/m    CC: UTI symptoms  Subjective:   HPI: Melanie Schneider is a 77 y.o. female presenting on 02/06/2023 for Dysuria (C/o burning when urinating. Sxs started 02/04/23.)   2d h/o bilateral shoulder ache when she urinates.  Notes she feels better when she increases water intake.  No increased caffeine intake, dark sodas or spicy foods.   No dysuria, urinary frequency or urgency, hematuria, fevers/chills, nausea/vomiting, flank pain, suprapubic pain.  No diarrhea.  Chronic constipation managed with miralax or suppository  She had a fall 3 weeks ago at [employer] despite using cane - no premonitory dizziness.   She is worried about ending on dialysis if she has a kidney infection - saw this on YouTube.   She recently started OTC supplement for cognition, doesn't remember name.  Has had several UTIs in the past 6 months: 12/2022 - UTI symptoms, UCx negative, treated with keflex 500mg  TID 7d course 08/2022 - UTI symptoms, UCx negative, treated with macrobid 06/2022 - UCx pan-sensitive E coli treated with keflex     Relevant past medical, surgical, family and social history reviewed and updated as indicated. Interim medical history since our last visit reviewed. Allergies and medications reviewed and updated. Outpatient Medications Prior to Visit  Medication Sig Dispense Refill   atorvastatin (LIPITOR) 40 MG tablet Take 1 tablet by mouth once daily 90 tablet 2   MELATONIN ER PO Take 1-2 each by mouth at bedtime as needed.     Multiple Vitamin (MULTIVITAMIN) tablet Take 1 tablet by mouth daily.     polyethylene glycol powder (GLYCOLAX/MIRALAX) 17 GM/SCOOP powder Take 1 Container  by mouth daily as needed.     vitamin B-12 (CYANOCOBALAMIN) 1000 MCG tablet Take 1,000 mcg by mouth daily.     cephALEXin (KEFLEX) 500 MG capsule Take 1 capsule (500 mg total) by mouth 3 (three) times daily. (Patient not taking: Reported on 01/10/2023) 21 capsule 0   Facility-Administered Medications Prior to Visit  Medication Dose Route Frequency Provider Last Rate Last Admin   SONAFINE emulsion 1 application  1 application  Topical BID Ronny Bacon, PA-C   1 application  at 10/02/17 1521     Per HPI unless specifically indicated in ROS section below Review of Systems  Objective:  BP 124/70   Pulse 62   Temp (!) 97.3 F (36.3 C) (Temporal)   Ht 5\' 3"  (1.6 m)   Wt 161 lb (73 kg)   SpO2 97%   BMI 28.52 kg/m   Wt Readings from Last 3 Encounters:  02/06/23 161 lb (73 kg)  01/10/23 162 lb (73.5 kg)  12/26/22 162 lb (73.5 kg)      Physical Exam Vitals and nursing note reviewed.  Constitutional:      Appearance: Normal appearance. She is not ill-appearing.     Comments: Ambulates with cane  HENT:     Mouth/Throat:     Mouth: Mucous membranes are moist.     Pharynx: Oropharynx is clear. No oropharyngeal exudate or posterior oropharyngeal erythema.  Cardiovascular:     Rate and Rhythm:  Normal rate and regular rhythm.     Pulses: Normal pulses.     Heart sounds: Normal heart sounds. No murmur heard. Pulmonary:     Effort: Pulmonary effort is normal. No respiratory distress.     Breath sounds: Normal breath sounds. No wheezing, rhonchi or rales.  Abdominal:     General: Bowel sounds are normal. There is no distension.     Palpations: Abdomen is soft. There is no mass.     Tenderness: There is no abdominal tenderness. There is no right CVA tenderness, left CVA tenderness, guarding or rebound. Negative signs include Murphy's sign.     Hernia: No hernia is present.  Musculoskeletal:     Right lower leg: No edema.     Left lower leg: No edema.  Skin:    General: Skin  is warm and dry.     Findings: No rash.  Neurological:     Mental Status: She is alert.  Psychiatric:        Mood and Affect: Mood normal.        Behavior: Behavior normal.       Results for orders placed or performed in visit on 02/06/23  POCT Urinalysis Dipstick (Automated)  Result Value Ref Range   Color, UA dark yellow    Clarity, UA cloudy    Glucose, UA Negative Negative   Bilirubin, UA 1+    Ketones, UA +/-    Spec Grav, UA 1.015 1.010 - 1.025   Blood, UA negative    pH, UA 6.0 5.0 - 8.0   Protein, UA Positive (A) Negative   Urobilinogen, UA 1.0 0.2 or 1.0 E.U./dL   Nitrite, UA positive    Leukocytes, UA Large (3+) (A) Negative    Assessment & Plan:   Problem List Items Addressed This Visit     Body aches - Primary    Predominant symptom is shoulder discomfort/ache during urination that made her worried about possible UTI. Discussed these symptoms are not consistent with UTI. Last 2 out of 3 urine cultures for UTI symptoms have returned negative for infection. Will send another UCx and if positive, low threshold to start abx course.  Supportive measures reviewed in interim - continue good water intake, suggested trial of cranberry tablets, avoiding bladder irritants, may take tylenol PRN.       Other Visit Diagnoses     Dysuria       Relevant Orders   POCT Urinalysis Dipstick (Automated) (Completed)   Urine Culture        No orders of the defined types were placed in this encounter.   Orders Placed This Encounter  Procedures   Urine Culture   POCT Urinalysis Dipstick (Automated)    Patient Instructions  Urine suspicious again for infection however you don't have typical symptoms of a UTI. I will send urine culture. We will call you with results and if positive for infection will start antibiotic course. Continue drinking plenty of fluids to ensure you stay well hydrated.  May try tylenol as needed for shoulder discomfort.   Follow up plan: Return  if symptoms worsen or fail to improve.  Eustaquio Boyden, MD

## 2023-02-06 NOTE — Patient Instructions (Addendum)
Urine suspicious again for infection however you don't have typical symptoms of a UTI. I will send urine culture. We will call you with results and if positive for infection will start antibiotic course. Continue drinking plenty of fluids to ensure you stay well hydrated.  May try tylenol as needed for shoulder discomfort.

## 2023-02-06 NOTE — Assessment & Plan Note (Addendum)
Predominant symptom is shoulder discomfort/ache during urination that made her worried about possible UTI. Discussed these symptoms are not consistent with UTI. Last 2 out of 3 urine cultures for UTI symptoms have returned negative for infection. Will send another UCx and if positive, low threshold to start abx course.  Supportive measures reviewed in interim - continue good water intake, suggested trial of cranberry tablets, avoiding bladder irritants, may take tylenol PRN.

## 2023-02-07 LAB — URINE CULTURE
MICRO NUMBER:: 14953915
SPECIMEN QUALITY:: ADEQUATE

## 2023-02-08 ENCOUNTER — Telehealth: Payer: Self-pay | Admitting: *Deleted

## 2023-02-08 DIAGNOSIS — E538 Deficiency of other specified B group vitamins: Secondary | ICD-10-CM

## 2023-02-08 DIAGNOSIS — R7309 Other abnormal glucose: Secondary | ICD-10-CM

## 2023-02-08 DIAGNOSIS — E7800 Pure hypercholesterolemia, unspecified: Secondary | ICD-10-CM

## 2023-02-08 NOTE — Telephone Encounter (Signed)
-----   Message from Alvina Chou sent at 02/08/2023  3:56 PM EDT ----- Regarding: Lab orders for Thursday, 5.30.24 Patient is scheduled for CPX labs, please order future labs, Thanks , Camelia Eng

## 2023-02-16 ENCOUNTER — Encounter: Payer: Self-pay | Admitting: Family Medicine

## 2023-02-16 ENCOUNTER — Ambulatory Visit (INDEPENDENT_AMBULATORY_CARE_PROVIDER_SITE_OTHER): Payer: Medicare HMO | Admitting: Family Medicine

## 2023-02-16 VITALS — BP 120/60 | HR 78 | Temp 97.4°F | Ht 63.0 in | Wt 160.2 lb

## 2023-02-16 DIAGNOSIS — N3289 Other specified disorders of bladder: Secondary | ICD-10-CM | POA: Insufficient documentation

## 2023-02-16 DIAGNOSIS — F03A0 Unspecified dementia, mild, without behavioral disturbance, psychotic disturbance, mood disturbance, and anxiety: Secondary | ICD-10-CM | POA: Insufficient documentation

## 2023-02-16 DIAGNOSIS — G3184 Mild cognitive impairment, so stated: Secondary | ICD-10-CM | POA: Insufficient documentation

## 2023-02-16 DIAGNOSIS — R413 Other amnesia: Secondary | ICD-10-CM | POA: Diagnosis not present

## 2023-02-16 DIAGNOSIS — K5909 Other constipation: Secondary | ICD-10-CM

## 2023-02-16 LAB — POC URINALSYSI DIPSTICK (AUTOMATED)
Bilirubin, UA: NEGATIVE
Blood, UA: NEGATIVE
Glucose, UA: NEGATIVE
Nitrite, UA: POSITIVE
Protein, UA: POSITIVE — AB
Spec Grav, UA: >=1.03 — AB (ref 1.010–1.025)
Urobilinogen, UA: 0.2 E.U./dL
pH, UA: 5.5 (ref 5.0–8.0)

## 2023-02-16 NOTE — Patient Instructions (Addendum)
Increase water... at least 48 ox tp 64 oz a day.  Stop caffeine, pepsi, orange and chocolate.  We will move forward with referral to urology. Take miralax daily to twice daily CONSISTENTLY.  At next OV will evaluate memory with mental status exam.

## 2023-02-16 NOTE — Assessment & Plan Note (Signed)
Chronic, poor control, in discussion with patient's husband it appears her memory issues are limiting her from remembering to take MiraLAX.  She has not really taken this consistently.  She did try increasing water, activity and take scheduled MiraLAX daily to twice daily between now and the next office visit.  If it is not improving her symptoms we will send in a prescription for Amitiza or Linzess.

## 2023-02-16 NOTE — Assessment & Plan Note (Signed)
Chronic, progressive changes per husband over the last year.  Gradual. She will return for her wellness visit June 6 and we will perform a complete Mini-Mental status exam.  I will also add test to evaluate memory such as vitamin deficiency thyroid etc. to her lab work done prior to her physical.

## 2023-02-16 NOTE — Assessment & Plan Note (Signed)
Acute, recurrent She has been seen 4 times in the last 6 months for urinary tract symptoms with all urine cultures negative.  She does appear to have some bacterial colonization in her bladder given elevated white blood cells on the urinalysis. Antibiotics are not indicated.  Given her recurrent symptoms we discussed bladder irritants and she will work on decreasing her citrus, caffeine, chocolate intake and will work on Field seismologist.  I will place a referral to urology for possible cystoscopy or further evaluation.

## 2023-02-16 NOTE — Progress Notes (Signed)
Patient ID: Melanie Schneider, female    DOB: 1946/09/11, 77 y.o.   MRN: [id number]  This visit was conducted in person.  BP 120/60 (BP Location: Right Arm, Patient Position: Sitting, Cuff Size: Large)   Pulse 78   Temp (!) 97.4 F (36.3 C) (Temporal)   Ht 5\' 3"  (1.6 m)   Wt 160 lb 4 oz (72.7 kg)   SpO2 97%   BMI 28.39 kg/m    CC:  Chief Complaint  Patient presents with   Burning with Urination   Gas   Constipation   Memory Loss    Subjective:   HPI: Melanie Schneider is a 77 y.o. female presenting on 02/16/2023 for Burning with Urination, Gas, Constipation, and Memory Loss   She reports new onset  burning with urination  ongoing  1-2 month.  Some urgency.  No blood in urine seen.  No fever.  No abdominal pain.  She does not drink more than 10-20 oz a day, Has tried Azo... helps temporarily.  Eats several oranges a day  Neg  Urine culture in 08/2022.... cefalexin TID x 7 days  12/26/22:  similar symptoms ... Returned negative as well... cefalexin.  On 02/06/23 Saw Dr. Sharen Hones. Gain culture negative.. no antibitoics.  She has a history of chronic constipation..  And has had minimal benefit with fiber, water and MiraLAX twice daily. Has tried suppository. She has noted excess gas as well. She is having BMs every 10-14 days.    Memory declining over the last 1-2 years. Gradual decline. Relevant past medical, surgical, family and social history reviewed and updated as indicated. Interim medical history since our last visit reviewed. Allergies and medications reviewed and updated. Outpatient Medications Prior to Visit  Medication Sig Dispense Refill   atorvastatin (LIPITOR) 40 MG tablet Take 1 tablet by mouth once daily 90 tablet 2   MELATONIN ER PO Take 1-2 each by mouth at bedtime as needed.     Multiple Vitamin (MULTIVITAMIN) tablet Take 1 tablet by mouth daily.     polyethylene glycol powder (GLYCOLAX/MIRALAX) 17 GM/SCOOP powder Take 1 Container by mouth  daily as needed.     vitamin B-12 (CYANOCOBALAMIN) 1000 MCG tablet Take 1,000 mcg by mouth daily.     Facility-Administered Medications Prior to Visit  Medication Dose Route Frequency Provider Last Rate Last Admin   SONAFINE emulsion 1 application  1 application  Topical BID Ronny Bacon, PA-C   1 application  at 10/02/17 1521     Per HPI unless specifically indicated in ROS section below Review of Systems  Constitutional:  Negative for fatigue and fever.  HENT:  Negative for congestion.   Eyes:  Negative for pain.  Respiratory:  Negative for cough and shortness of breath.   Cardiovascular:  Negative for chest pain, palpitations and leg swelling.  Gastrointestinal:  Positive for constipation. Negative for abdominal pain.  Genitourinary:  Positive for dysuria. Negative for vaginal bleeding.  Musculoskeletal:  Negative for back pain.  Neurological:  Negative for syncope, light-headedness and headaches.  Psychiatric/Behavioral:  Negative for dysphoric mood.    Objective:  BP 120/60 (BP Location: Right Arm, Patient Position: Sitting, Cuff Size: Large)   Pulse 78   Temp (!) 97.4 F (36.3 C) (Temporal)   Ht 5\' 3"  (1.6 m)   Wt 160 lb 4 oz (72.7 kg)   SpO2 97%   BMI 28.39 kg/m   Wt Readings from Last 3 Encounters:  02/16/23 160 lb 4 oz (72.7  kg)  02/06/23 161 lb (73 kg)  01/10/23 162 lb (73.5 kg)      Physical Exam Constitutional:      General: She is not in acute distress.    Appearance: Normal appearance. She is well-developed. She is not ill-appearing or toxic-appearing.  HENT:     Head: Normocephalic.     Right Ear: Hearing, tympanic membrane, ear canal and external ear normal. Tympanic membrane is not erythematous, retracted or bulging.     Left Ear: Hearing, tympanic membrane, ear canal and external ear normal. Tympanic membrane is not erythematous, retracted or bulging.     Nose: No mucosal edema or rhinorrhea.     Right Sinus: No maxillary sinus tenderness or  frontal sinus tenderness.     Left Sinus: No maxillary sinus tenderness or frontal sinus tenderness.     Mouth/Throat:     Pharynx: Uvula midline.  Eyes:     General: Lids are normal. Lids are everted, no foreign bodies appreciated.     Conjunctiva/sclera: Conjunctivae normal.     Pupils: Pupils are equal, round, and reactive to light.  Neck:     Thyroid: No thyroid mass or thyromegaly.     Vascular: No carotid bruit.     Trachea: Trachea normal.  Cardiovascular:     Rate and Rhythm: Normal rate and regular rhythm.     Pulses: Normal pulses.     Heart sounds: Normal heart sounds, S1 normal and S2 normal. No murmur heard.    No friction rub. No gallop.  Pulmonary:     Effort: Pulmonary effort is normal. No tachypnea or respiratory distress.     Breath sounds: Normal breath sounds. No decreased breath sounds, wheezing, rhonchi or rales.  Abdominal:     General: Bowel sounds are normal.     Palpations: Abdomen is soft.     Tenderness: There is no abdominal tenderness.  Musculoskeletal:     Cervical back: Normal range of motion and neck supple.  Skin:    General: Skin is warm and dry.     Findings: No rash.  Neurological:     Mental Status: She is alert.  Psychiatric:        Mood and Affect: Mood is not anxious or depressed.        Speech: Speech normal.        Behavior: Behavior normal. Behavior is cooperative.        Thought Content: Thought content normal.        Judgment: Judgment normal.       Results for orders placed or performed in visit on 02/06/23  Urine Culture   Specimen: Urine  Result Value Ref Range   MICRO NUMBER: [id number]    SPECIMEN QUALITY: Adequate    Sample Source URINE    STATUS: FINAL    Result:      Mixed genital flora isolated. These superficial bacteria are not indicative of a urinary tract infection. No further organism identification is warranted on this specimen. If clinically indicated, recollect clean-catch, mid-stream urine and transfer   immediately to Urine Culture Transport Tube.   POCT Urinalysis Dipstick (Automated)  Result Value Ref Range   Color, UA dark yellow    Clarity, UA cloudy    Glucose, UA Negative Negative   Bilirubin, UA 1+    Ketones, UA +/-    Spec Grav, UA 1.015 1.010 - 1.025   Blood, UA negative    pH, UA 6.0 5.0 - 8.0   Protein,  UA Positive (A) Negative   Urobilinogen, UA 1.0 0.2 or 1.0 E.U./dL   Nitrite, UA positive    Leukocytes, UA Large (3+) (A) Negative    Assessment and Plan  Chronic constipation Assessment & Plan: Chronic, poor control, in discussion with patient's husband it appears her memory issues are limiting her from remembering to take MiraLAX.  She has not really taken this consistently.  She did try increasing water, activity and take scheduled MiraLAX daily to twice daily between now and the next office visit.  If it is not improving her symptoms we will send in a prescription for Amitiza or Linzess.   Bladder irritation Assessment & Plan: Acute, recurrent She has been seen 4 times in the last 6 months for urinary tract symptoms with all urine cultures negative.  She does appear to have some bacterial colonization in her bladder given elevated white blood cells on the urinalysis. Antibiotics are not indicated.  Given her recurrent symptoms we discussed bladder irritants and she will work on decreasing her citrus, caffeine, chocolate intake and will work on Field seismologist.  I will place a referral to urology for possible cystoscopy or further evaluation.  Orders: -     POCT Urinalysis Dipstick (Automated) -     Ambulatory referral to Urology  Memory change Assessment & Plan: Chronic, progressive changes per husband over the last year.  Gradual. She will return for her wellness visit June 6 and we will perform a complete Mini-Mental status exam.  I will also add test to evaluate memory such as vitamin deficiency thyroid etc. to her lab work done prior to her  physical.  Orders: -     VITAMIN D 25 Hydroxy (Vit-D Deficiency, Fractures); Future -     TSH; Future    No follow-ups on file.   Kerby Nora, MD

## 2023-02-22 ENCOUNTER — Other Ambulatory Visit (INDEPENDENT_AMBULATORY_CARE_PROVIDER_SITE_OTHER): Payer: Medicare HMO

## 2023-02-22 DIAGNOSIS — E7800 Pure hypercholesterolemia, unspecified: Secondary | ICD-10-CM | POA: Diagnosis not present

## 2023-02-22 DIAGNOSIS — E538 Deficiency of other specified B group vitamins: Secondary | ICD-10-CM

## 2023-02-22 DIAGNOSIS — R413 Other amnesia: Secondary | ICD-10-CM | POA: Diagnosis not present

## 2023-02-22 DIAGNOSIS — R7309 Other abnormal glucose: Secondary | ICD-10-CM

## 2023-02-22 LAB — COMPREHENSIVE METABOLIC PANEL
ALT: 9 U/L (ref 0–35)
AST: 12 U/L (ref 0–37)
Albumin: 3.8 g/dL (ref 3.5–5.2)
Alkaline Phosphatase: 56 U/L (ref 39–117)
BUN: 16 mg/dL (ref 6–23)
CO2: 27 mEq/L (ref 19–32)
Calcium: 8.6 mg/dL (ref 8.4–10.5)
Chloride: 108 mEq/L (ref 96–112)
Creatinine, Ser: 0.62 mg/dL (ref 0.40–1.20)
GFR: 86.28 mL/min (ref >60.00)
Glucose, Bld: 88 mg/dL (ref 70–99)
Potassium: 3.9 mEq/L (ref 3.5–5.1)
Sodium: 142 mEq/L (ref 135–145)
Total Bilirubin: 0.7 mg/dL (ref 0.2–1.2)
Total Protein: 6.2 g/dL (ref 6.0–8.3)

## 2023-02-22 LAB — VITAMIN B12: Vitamin B-12: 132 pg/mL — ABNORMAL LOW (ref 211–911)

## 2023-02-22 LAB — LIPID PANEL
Cholesterol: 128 mg/dL (ref 0–200)
HDL: 43.3 mg/dL (ref >39.00)
LDL Cholesterol: 73 mg/dL (ref 0–99)
NonHDL: 84.22
Total CHOL/HDL Ratio: 3
Triglycerides: 55 mg/dL (ref 0.0–149.0)
VLDL: 11 mg/dL (ref 0.0–40.0)

## 2023-02-22 LAB — VITAMIN D 25 HYDROXY (VIT D DEFICIENCY, FRACTURES): VITD: 26.06 ng/mL — ABNORMAL LOW (ref 30.00–100.00)

## 2023-02-22 LAB — TSH: TSH: 1.77 u[IU]/mL (ref 0.35–5.50)

## 2023-02-22 LAB — HEMOGLOBIN A1C: Hgb A1c MFr Bld: 5.5 % (ref 4.6–6.5)

## 2023-02-23 NOTE — Progress Notes (Signed)
No critical labs need to be addressed urgently. We will discuss labs in detail at upcoming office visit.   

## 2023-03-01 ENCOUNTER — Encounter: Payer: Self-pay | Admitting: Family Medicine

## 2023-03-01 ENCOUNTER — Ambulatory Visit (INDEPENDENT_AMBULATORY_CARE_PROVIDER_SITE_OTHER): Payer: Medicare HMO | Admitting: Family Medicine

## 2023-03-01 VITALS — BP 106/60 | HR 70 | Temp 97.9°F | Resp 16 | Ht 61.25 in | Wt 158.1 lb

## 2023-03-01 DIAGNOSIS — Z853 Personal history of malignant neoplasm of breast: Secondary | ICD-10-CM

## 2023-03-01 DIAGNOSIS — I5032 Chronic diastolic (congestive) heart failure: Secondary | ICD-10-CM | POA: Diagnosis not present

## 2023-03-01 DIAGNOSIS — M8580 Other specified disorders of bone density and structure, unspecified site: Secondary | ICD-10-CM | POA: Diagnosis not present

## 2023-03-01 DIAGNOSIS — I10 Essential (primary) hypertension: Secondary | ICD-10-CM

## 2023-03-01 DIAGNOSIS — E7800 Pure hypercholesterolemia, unspecified: Secondary | ICD-10-CM

## 2023-03-01 DIAGNOSIS — I700 Atherosclerosis of aorta: Secondary | ICD-10-CM

## 2023-03-01 DIAGNOSIS — R7309 Other abnormal glucose: Secondary | ICD-10-CM | POA: Diagnosis not present

## 2023-03-01 DIAGNOSIS — G3184 Mild cognitive impairment, so stated: Secondary | ICD-10-CM

## 2023-03-01 DIAGNOSIS — E559 Vitamin D deficiency, unspecified: Secondary | ICD-10-CM | POA: Diagnosis not present

## 2023-03-01 DIAGNOSIS — K5909 Other constipation: Secondary | ICD-10-CM | POA: Diagnosis not present

## 2023-03-01 DIAGNOSIS — E538 Deficiency of other specified B group vitamins: Secondary | ICD-10-CM | POA: Diagnosis not present

## 2023-03-01 DIAGNOSIS — Z0000 Encounter for general adult medical examination without abnormal findings: Secondary | ICD-10-CM | POA: Diagnosis not present

## 2023-03-01 MED ORDER — VITAMIN D3 1.25 MG (50000 UT) PO CAPS: 1.0000 | ORAL_CAPSULE | ORAL | 1 refills | Status: AC

## 2023-03-01 MED ORDER — CYANOCOBALAMIN 1000 MCG/ML IJ SOLN
1000.0000 ug | Freq: Once | INTRAMUSCULAR | Status: AC
Start: 2023-03-01 — End: 2023-03-01
  Administered 2023-03-01: 1000 ug via INTRAMUSCULAR

## 2023-03-01 NOTE — Assessment & Plan Note (Addendum)
Chronic, possibly contributing to memory issues.  Not currently taking B12 orally as previously instructed last year.  Given B12 injection today.. will follow with OTC B12 oral supplement longterm.

## 2023-03-01 NOTE — Assessment & Plan Note (Addendum)
Chronic, resolved completely after taking MiraLAX 17 g consistently daily.

## 2023-03-01 NOTE — Progress Notes (Signed)
Patient ID: Melanie Schneider, female    DOB: [birth year], 77 y.o.   MRN: [id number]  This visit was conducted in person.  BP 106/60   Pulse 70   Temp 97.9 F (36.6 C)   Resp 16   Ht 5' 1.25" (1.556 m)   Wt 158 lb 2 oz (71.7 kg)   SpO2 98%   BMI 29.63 kg/m    CC:  Chief Complaint  Patient presents with   Annual Exam    Subjective:   HPI: Melanie Schneider is a 77 y.o. female presenting on 03/01/2023 for Annual Exam  The patient presents for complete physical and review of chronic health problems. He/She also has the following acute concerns today:  see below   The patient saw a LPN or RN for medicare wellness visit. 01/10/2023  Prevention and wellness was reviewed in detail. Note reviewed and important notes copied below.  Wt Readings from Last 3 Encounters:  03/01/23 158 lb 2 oz (71.7 kg)  02/16/23 160 lb 4 oz (72.7 kg)  02/06/23 161 lb (73 kg)    Chronic Bladder irritation, incontinence... referral to urology in process... appt 03/15/23  Chronic constipation:  At last OV  2 weeks ago... poor control...was not consistently taking miralax .  Encouraged her to use miralax twice daily. She reports now that she has used it more consistently once daily.. has helped.  Having BM every day.    Memory loss... Gradual progressive change over the last year. Lab evaluation showed both low vitamin D and low B12 MMSE today  Aortic atherosclerosis: LDL almost at goal less than 70 on atorvastatin 40 mg p.o. daily Lab Results  Component Value Date   CHOL 128 02/22/2023   HDL 43.30 02/22/2023   LDLCALC 73 02/22/2023   LDLDIRECT 119.9 10/07/2013   TRIG 55.0 02/22/2023   CHOLHDL 3 02/22/2023    Chronic diastolic heart failure: No current volume overload, pulmonary edema  COPD: stable control on no controllers, just using albuterol as needed  Prediabetes: Lab Results  Component Value Date   HGBA1C 5.5 02/22/2023   breast cancer: Followed by oncology,  currently actively treated with Femara  Hypertension:  Well controlled on no medication  BP Readings from Last 3 Encounters:  03/01/23 106/60  02/16/23 120/60  02/06/23 124/70  Using medication without problems or lightheadedness:  none Chest pain with exertion: none Edema: none Short of breath:none Average home BPs: Other issues:  Relevant past medical, surgical, family and social history reviewed and updated as indicated. Interim medical history since our last visit reviewed. Allergies and medications reviewed and updated. Outpatient Medications Prior to Visit  Medication Sig Dispense Refill   atorvastatin (LIPITOR) 40 MG tablet Take 1 tablet by mouth once daily 90 tablet 2   MELATONIN ER PO Take 1-2 each by mouth at bedtime as needed.     Multiple Vitamin (MULTIVITAMIN) tablet Take 1 tablet by mouth daily.     polyethylene glycol powder (GLYCOLAX/MIRALAX) 17 GM/SCOOP powder Take 1 Container by mouth daily as needed.     vitamin B-12 (CYANOCOBALAMIN) 1000 MCG tablet Take 1,000 mcg by mouth daily.     Facility-Administered Medications Prior to Visit  Medication Dose Route Frequency Provider Last Rate Last Admin   SONAFINE emulsion 1 application  1 application  Topical BID Ronny Bacon, PA-C   1 application  at 10/02/17 1521     Per HPI unless specifically indicated in ROS section below Review of Systems  Constitutional:  Negative for fatigue and fever.  HENT:  Negative for congestion.   Eyes:  Negative for pain.  Respiratory:  Negative for cough and shortness of breath.   Cardiovascular:  Negative for chest pain, palpitations and leg swelling.  Gastrointestinal:  Negative for abdominal pain.  Genitourinary:  Negative for dysuria and vaginal bleeding.  Musculoskeletal:  Negative for back pain.  Neurological:  Negative for syncope, light-headedness and headaches.  Psychiatric/Behavioral:  Negative for dysphoric mood.    Objective:  BP 106/60   Pulse 70   Temp  97.9 F (36.6 C)   Resp 16   Ht 5' 1.25" (1.556 m)   Wt 158 lb 2 oz (71.7 kg)   SpO2 98%   BMI 29.63 kg/m   Wt Readings from Last 3 Encounters:  03/01/23 158 lb 2 oz (71.7 kg)  02/16/23 160 lb 4 oz (72.7 kg)  02/06/23 161 lb (73 kg)      Physical Exam Vitals and nursing note reviewed.  Constitutional:      General: She is not in acute distress.    Appearance: Normal appearance. She is well-developed. She is not ill-appearing or toxic-appearing.  HENT:     Head: Normocephalic.     Right Ear: Hearing, tympanic membrane, ear canal and external ear normal.     Left Ear: Hearing, tympanic membrane, ear canal and external ear normal.     Nose: Nose normal.  Eyes:     General: Lids are normal. Lids are everted, no foreign bodies appreciated.     Conjunctiva/sclera: Conjunctivae normal.     Pupils: Pupils are equal, round, and reactive to light.  Neck:     Thyroid: No thyroid mass or thyromegaly.     Vascular: No carotid bruit.     Trachea: Trachea normal.  Cardiovascular:     Rate and Rhythm: Normal rate and regular rhythm.     Heart sounds: Normal heart sounds, S1 normal and S2 normal. No murmur heard.    No gallop.  Pulmonary:     Effort: Pulmonary effort is normal. No respiratory distress.     Breath sounds: Normal breath sounds. No wheezing, rhonchi or rales.  Abdominal:     General: Bowel sounds are normal. There is no distension or abdominal bruit.     Palpations: Abdomen is soft. There is no fluid wave or mass.     Tenderness: There is no abdominal tenderness. There is no guarding or rebound.     Hernia: No hernia is present.  Musculoskeletal:     Cervical back: Normal range of motion and neck supple.  Lymphadenopathy:     Cervical: No cervical adenopathy.  Skin:    General: Skin is warm and dry.     Findings: No rash.  Neurological:     Mental Status: She is alert and oriented to person, place, and time.     Cranial Nerves: No cranial nerve deficit.     Sensory:  No sensory deficit.     Comments:  MMSE 30/30  Nml clock drawing  Animal recall 6 in 15 sec  Psychiatric:        Mood and Affect: Mood is not anxious or depressed.        Speech: Speech normal.        Behavior: Behavior normal. Behavior is cooperative.        Judgment: Judgment normal.       Results for orders placed or performed in visit on 02/22/23  TSH  Result Value  Ref Range   TSH 1.77 0.35 - 5.50 uIU/mL  VITAMIN D 25 Hydroxy (Vit-D Deficiency, Fractures)  Result Value Ref Range   VITD 26.06 (L) 30.00 - 100.00 ng/mL  Vitamin B12  Result Value Ref Range   Vitamin B-12 132 (L) 211 - 911 pg/mL  Hemoglobin A1c  Result Value Ref Range   Hgb A1c MFr Bld 5.5 4.6 - 6.5 %  Lipid panel  Result Value Ref Range   Cholesterol 128 0 - 200 mg/dL   Triglycerides 78.2 0.0 - 149.0 mg/dL   HDL 95.62 >13.08 mg/dL   VLDL 65.7 0.0 - 84.6 mg/dL   LDL Cholesterol 73 0 - 99 mg/dL   Total CHOL/HDL Ratio 3    NonHDL 84.22   Comprehensive metabolic panel  Result Value Ref Range   Sodium 142 135 - 145 mEq/L   Potassium 3.9 3.5 - 5.1 mEq/L   Chloride 108 96 - 112 mEq/L   CO2 27 19 - 32 mEq/L   Glucose, Bld 88 70 - 99 mg/dL   BUN 16 6 - 23 mg/dL   Creatinine, Ser 9.62 0.40 - 1.20 mg/dL   Total Bilirubin 0.7 0.2 - 1.2 mg/dL   Alkaline Phosphatase 56 39 - 117 U/L   AST 12 0 - 37 U/L   ALT 9 0 - 35 U/L   Total Protein 6.2 6.0 - 8.3 g/dL   Albumin 3.8 3.5 - 5.2 g/dL   GFR 95.28 >41.32 mL/min   Calcium 8.6 8.4 - 10.5 mg/dL     COVID 19 screen:  No recent travel or known exposure to COVID19 The patient denies respiratory symptoms of COVID 19 at this time. The importance of social distancing was discussed today.   Assessment and Plan   The patient's preventative maintenance and recommended screening tests for an annual wellness exam were reviewed in full today. Brought up to date unless services declined.  Counselled on the importance of diet, exercise, and its role in overall health and  mortality. The patient's FH and SH was reviewed, including their home life, tobacco status, and drug and alcohol status.   Vaccines: uptodate except shingles. COVID x4, postpone tetanus Pap/DVE: not indicated, s/p TAH, sister with ovarian cancer Mammo: personal hx of  breast cancer followed by ONC 06/2022 Bone Density: 2019, osteopenia.. repeat in 2024.. DUE Colon:  11/2021  1 polyp, no further needed. Smoking Status: former smoker, > 40 years, aged out of lung cancer screening program ETOH/ drug use: No ETOH in years/ no smoking  Hep C:  done   Problem List Items Addressed This Visit     Aortic atherosclerosis (HCC)    LDL at goal less than 70 on atorvastatin 40 mg daily      Chronic constipation    Chronic,       Chronic diastolic heart failure (HCC)    Chronic, no evidence of fluid overload.      History of breast cancer    07/24/2017 left lumpectomy: IDC grade 2, 1.5 cm, intermediate grade DCIS, anterior margin broadly positive, posterior margin focally positive, lymphovascular invasion present, 0/2 lymph nodes negative T1CN0 stage I a   Right lumpectomy: DCIS grade 2 Tis NX stage 0 adjuvant radiation therapy 08/29/17- 10/01/17  Adjuvant antiestrogen therapy with Letrozole 2.5 mg daily started October 26, 2017  Followed by oncology.      Mild cognitive impairment     B12 and Vit D low.. will replete. MAy also improve with BM dail.  Re-eval in  3 months with MMSE and consdier further eval if decline vs medicaiton like aricpt.      Osteopenia   Relevant Orders   DG Bone Density   PREDIABETES    Improving control.  Encouraged exercise, weight loss, healthy eating habits.       Primary hypertension    Chronic, resolved with weight changed on no medication.      Pure hypercholesterolemia    Chronic, well controlled LDL at goal less than 70 on atorvastatin 40 mg p.o. daily.      Vitamin B12 deficiency    Chronic, possibly contributing to memory issues.  Not  currently taking B12 orally as previously instructed last year.  Given B12 injection today.. will follow with OTC B12 oral supplement longterm.      Vitamin D deficiency   Other Visit Diagnoses     Routine general medical examination at a health care facility    -  Primary       Kerby Nora, MD

## 2023-03-01 NOTE — Assessment & Plan Note (Signed)
Chronic, resolved with weight changed on no medication. 

## 2023-03-01 NOTE — Assessment & Plan Note (Signed)
Improving control. Encouraged exercise, weight loss, healthy eating habits.  

## 2023-03-01 NOTE — Assessment & Plan Note (Signed)
07/24/2017 left lumpectomy: IDC grade 2, 1.5 cm, intermediate grade DCIS, anterior margin broadly positive, posterior margin focally positive, lymphovascular invasion present, 0/2 lymph nodes negative T1CN0 stage I a   Right lumpectomy: DCIS grade 2 Tis NX stage 0 adjuvant radiation therapy 08/29/17- 10/01/17  Adjuvant antiestrogen therapy with Letrozole 2.5 mg daily started October 26, 2017  Followed by oncology.

## 2023-03-01 NOTE — Addendum Note (Signed)
Addended by: Vertis Kelch on: 03/01/2023 11:30 AM   Modules accepted: Orders

## 2023-03-01 NOTE — Assessment & Plan Note (Signed)
Chronic, well controlled LDL at goal less than 70 on atorvastatin 40 mg p.o. daily. 

## 2023-03-01 NOTE — Patient Instructions (Addendum)
Keep uroloy appt as planned.  Start Vit D once a week prescription for at least 3-6 months.  Start B12 1000 mg daily.   IN OCTOBER  Please call [facility] from the menu below to schedule your Mammogram and Bone Density appointment.    [facility]   [facility]                      Phone:  [phone] [address]                               [place], [place] [address]                                                             Services: Traditional and 3D Mammogram, Bone Density   [facility]                 Phone: [phone] [address]                                                       [place], [place] [address]    Service: Bone Density ONLY   *this site does NOT perform mammograms  [facility] [address]                        Phone:  [phone] [address]                                  [place], [place] [address]                                            Services:  3D Mammogram and Bone Density

## 2023-03-01 NOTE — Assessment & Plan Note (Signed)
B12 and Vit D low.. will replete. MAy also improve with BM dail.  Re-eval in 3 months with MMSE and consdier further eval if decline vs medicaiton like aricpt.

## 2023-03-01 NOTE — Assessment & Plan Note (Signed)
Chronic, no evidence of fluid overload. °

## 2023-03-01 NOTE — Assessment & Plan Note (Signed)
LDL at goal less than 70 on atorvastatin 40 mg daily 

## 2023-03-21 ENCOUNTER — Encounter: Payer: Self-pay | Admitting: Urology

## 2023-03-21 ENCOUNTER — Ambulatory Visit: Payer: Medicare HMO | Admitting: Urology

## 2023-03-21 VITALS — BP 128/74 | HR 81 | Ht 63.0 in | Wt 149.2 lb

## 2023-03-21 DIAGNOSIS — R8281 Pyuria: Secondary | ICD-10-CM

## 2023-03-21 DIAGNOSIS — R300 Dysuria: Secondary | ICD-10-CM | POA: Diagnosis not present

## 2023-03-21 LAB — MICROSCOPIC EXAMINATION

## 2023-03-21 LAB — URINALYSIS, COMPLETE
Bilirubin, UA: NEGATIVE
Glucose, UA: NEGATIVE
Ketones, UA: NEGATIVE
Nitrite, UA: NEGATIVE
Protein,UA: NEGATIVE
Specific Gravity, UA: 1.025 (ref 1.005–1.030)
Urobilinogen, Ur: 1 mg/dL (ref 0.2–1.0)
pH, UA: 5.5 (ref 5.0–7.5)

## 2023-03-21 NOTE — Progress Notes (Signed)
I, Duke Salvia, acting as a Neurosurgeon for Riki Altes, MD., have documented all relevant documentation on the behalf of Riki Altes, MD, as directed by  Riki Altes, MD while in the presence of Riki Altes, MD.  03/21/2023 6:20 PM   Melanie Schneider Melanie Schneider 07-09-1946 [id number]  Referring provider: Excell Seltzer, MD [address] [place],  [place] [address]  Chief Complaint  Patient presents with   Establish Care    HPI: Melanie Schneider is a 77 y.o. female referred for evaluation of bladder irritation.  Has been seen by PCP on 4 occasions in the last 6 months for dysuria, frequency, and urgency. She has been treated with empiric antibiotics without improvement and all urine cultures have been negative. Her UA dipsticks have shown large leukocytes and nitrite positive. Denies gross hematuria.   PMH: Past Medical History:  Diagnosis Date   Breast cancer, left (HCC) 06/2017   COPD (chronic obstructive pulmonary disease) (HCC)    states coughs frequently; occasional SOB with ADLs; no home O2   Ductal carcinoma in situ (DCIS) of right breast 06/2017   Dysthymia    Family history of adverse reaction to anesthesia    states father had reaction to anesthesia and had to be resuscitated   History of CVA (cerebrovascular accident) 2014   Hyperlipidemia    Morbid obesity due to excess calories (HCC) complicated by hyperlipidemia 12/13/2006   Qualifier: Diagnosis of  By: Ermalene Searing MD, Amy     Peripheral neuropathy    lower legs - walks with a straight cane    Surgical History: Past Surgical History:  Procedure Laterality Date   BREAST LUMPECTOMY WITH NEEDLE LOCALIZATION AND AXILLARY SENTINEL LYMPH NODE BX Bilateral 07/24/2017   Procedure: BILATERAL BREAST LUMPECTOMY WITH BILATERAL NEEDLE LOCALIZATION AND LEFT AXILLARY SENTINEL LYMPH NODE BX;  Surgeon: Harriette Bouillon, MD;  Location: [facility];  Service: General;  Laterality: Bilateral;    CARDIAC CATHETERIZATION  12/26/2001   normal   COLONOSCOPY WITH PROPOFOL N/A 05/09/2021   Procedure: COLONOSCOPY WITH PROPOFOL;  Surgeon: Toney Reil, MD;  Location: [facility];  Service: Gastroenterology;  Laterality: N/A;   ESOPHAGOGASTRODUODENOSCOPY  02/2005   TONSILLECTOMY     as a child   VAGINAL HYSTERECTOMY      Home Medications:  Allergies as of 03/21/2023       Reactions   Iodine Other (See Comments)   CHEMICAL BURN ON SKIN   Other Hives   MEAT TENDERIZER   Povidone-iodine Other (See Comments)   CHEMICAL BURN ON SKIN   Wound Dressing Adhesive Other (See Comments)   Adhesive [tape] Other (See Comments)   SKIN IRRITATION   Latex Other (See Comments)   SKIN IRRITATION        Medication List        Accurate as of March 21, 2023  6:20 PM. If you have any questions, ask your nurse or doctor.          atorvastatin 40 MG tablet Commonly known as: LIPITOR Take 1 tablet by mouth once daily   cyanocobalamin 1000 MCG tablet Commonly known as: VITAMIN B12 Take 1,000 mcg by mouth daily.   MELATONIN ER PO Take 1-2 each by mouth at bedtime as needed.   multivitamin tablet Take 1 tablet by mouth daily.   polyethylene glycol powder 17 GM/SCOOP powder Commonly known as: GLYCOLAX/MIRALAX Take 1 Container by mouth daily as needed.   Vitamin D3 1.25 MG (  50000 UT) Caps Take 1 capsule (1.25 mg total) by mouth once a week.        Allergies:  Allergies  Allergen Reactions   Iodine Other (See Comments)    CHEMICAL BURN ON SKIN   Other Hives    MEAT TENDERIZER   Povidone-Iodine Other (See Comments)    CHEMICAL BURN ON SKIN   Wound Dressing Adhesive Other (See Comments)   Adhesive [Tape] Other (See Comments)    SKIN IRRITATION   Latex Other (See Comments)    SKIN IRRITATION    Family History: Family History  Problem Relation Age of Onset   Cancer Father    Anesthesia problems Father        had unknown (by pt.) reaction to anesthesia, had to be  resuscitated   Heart disease Mother    Breast cancer Mother    Alcohol abuse Other    Breast cancer Other        1st degree relative <50: mother and aunt   Asthma Daughter    Emphysema Paternal Grandfather     Social History:  reports that she quit smoking about 9 years ago. Her smoking use included cigarettes. She has a 25.50 pack-year smoking history. She has never used smokeless tobacco. She reports current drug use. Drugs: Other-see comments and Marijuana. She reports that she does not drink alcohol.   Physical Exam: BP 128/74 (BP Location: Left Arm, Patient Position: Sitting, Cuff Size: Small)   Pulse 81   Ht 5\' 3"  (1.6 m)   Wt 149 lb 4 oz (67.7 kg)   BMI 26.44 kg/m   Constitutional:  Alert and oriented, No acute distress. HEENT: [place] AT Respiratory: Normal respiratory effort, no increased work of breathing. GI: Abdomen is soft, nontender, nondistended, no abdominal masses Psychiatric: Normal mood and affect.   Laboratory Data:  Urinalysis Dipstick trace blood/trace leukocytes, nitrite negative. Microscopy 11-30 WBC.   Assessment & Plan:    1. Chronic dysuria Sterile pyuria. Significant pyuria on today's UA. Repeat urine culture ordered. Recommend cystoscopy for further evaluation.  I have reviewed the above documentation for accuracy and completeness, and I agree with the above.   Riki Altes, MD  [facility] [address], [address] [place], [place] [address] [phone]

## 2023-03-25 LAB — CULTURE, URINE COMPREHENSIVE

## 2023-03-27 ENCOUNTER — Other Ambulatory Visit: Payer: Self-pay | Admitting: *Deleted

## 2023-03-27 LAB — CULTURE, URINE COMPREHENSIVE

## 2023-03-27 MED ORDER — CEFUROXIME AXETIL 250 MG PO TABS: 250.0000 mg | ORAL_TABLET | Freq: Two times a day (BID) | ORAL | 0 refills | Status: AC

## 2023-04-11 ENCOUNTER — Ambulatory Visit: Payer: Medicare HMO | Admitting: Urology

## 2023-04-11 ENCOUNTER — Encounter: Payer: Self-pay | Admitting: Urology

## 2023-04-11 VITALS — BP 138/74 | HR 80 | Ht 63.0 in | Wt 161.0 lb

## 2023-04-11 DIAGNOSIS — R300 Dysuria: Secondary | ICD-10-CM | POA: Diagnosis not present

## 2023-04-11 LAB — URINALYSIS, COMPLETE
Bilirubin, UA: NEGATIVE
Glucose, UA: NEGATIVE
Nitrite, UA: POSITIVE — AB
RBC, UA: NEGATIVE
Specific Gravity, UA: 1.02 (ref 1.005–1.030)
Urobilinogen, Ur: 2 mg/dL — ABNORMAL HIGH (ref 0.2–1.0)
pH, UA: 6.5 (ref 5.0–7.5)

## 2023-04-11 LAB — MICROSCOPIC EXAMINATION: RBC, Urine: NONE SEEN /hpf (ref 0–2)

## 2023-04-11 NOTE — Addendum Note (Signed)
Addended by: Levada Schilling on: 04/11/2023 04:01 PM   Modules accepted: Orders

## 2023-04-11 NOTE — Progress Notes (Signed)
   04/11/23  CC:  Chief Complaint  Patient presents with   Cysto    HPI: Refer to my previous note 03/21/2023.  Urine culture 03/21/2023 was positive for E. coli and she was treated with cefuroxime.  UA today negative on microscopy.  Her dysuria resolved after antibiotic treatment.  Blood pressure 138/74, pulse 80, height 5\' 3"  (1.6 m), weight 161 lb (73 kg). NED. A&Ox3.   No respiratory distress   Abd soft, NT, ND Normal external genitalia with patent urethral meatus  Cystoscopy Procedure Note  Patient identification was confirmed, informed consent was obtained, and patient was prepped using Betadine solution.  Lidocaine jelly was administered per urethral meatus.    Procedure: - Flexible cystoscope introduced, without any difficulty.   - Thorough search of the bladder revealed:    normal urethral meatus    normal urothelium    no stones    no ulcers     no tumors    no urethral polyps    no trabeculation  - Ureteral orifices were normal in position and appearance.  Post-Procedure: - Patient tolerated the procedure well  Assessment/ Plan: No mucosal abnormalities seen on cystoscopy Presently asymptomatic and UA negative Return as needed for recurrent symptoms    Riki Altes, MD

## 2023-04-14 LAB — CULTURE, URINE COMPREHENSIVE

## 2023-05-05 IMAGING — CT CT CHEST LUNG CANCER SCREENING LOW DOSE W/O CM
2 of 5 series · 15 of 40 positions shown, 18 images · non-contrast
Comparison: 12/01/2019

CLINICAL DATA: 75-year-old female with 51 pack-year history of
smoking. Lung cancer screening.



[Series 3: lung 1.00 · axial · 0.59mm/px · z∈[-1285,-1010]mm · 12 of 303 slices shown, 15 images]
[im 14/303  mediastinal]
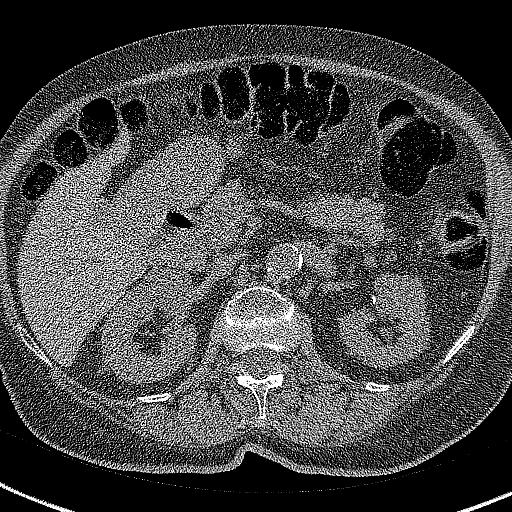
[im 14/303  lung]
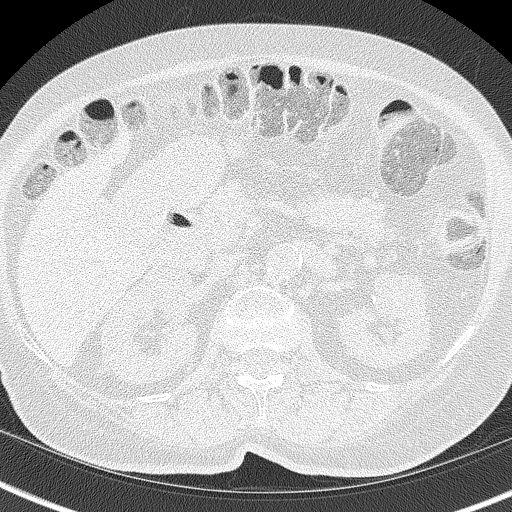
[im 42/303  lung]
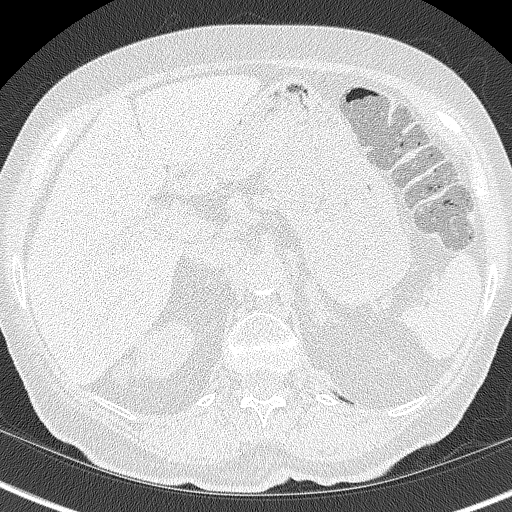
[im 69/303  lung]
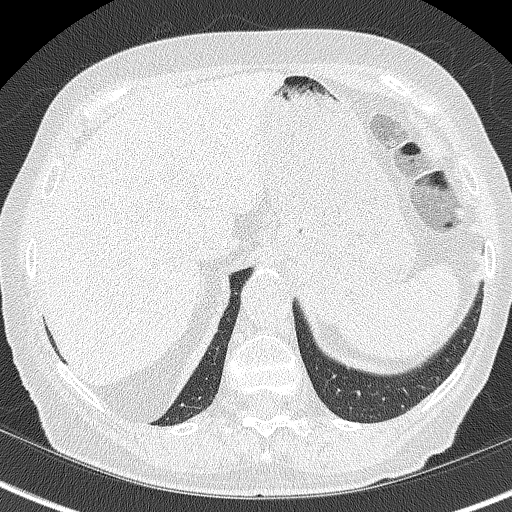
[im 97/303  lung]
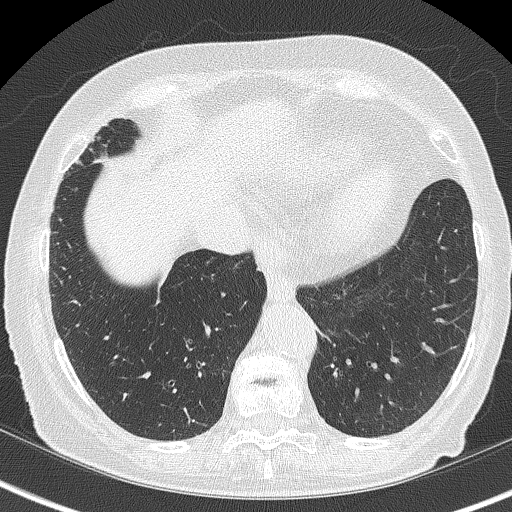
[im 110/303  mediastinal]
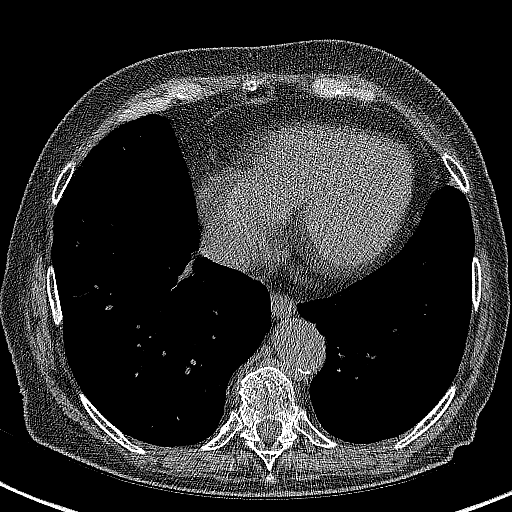
[im 110/303  lung]
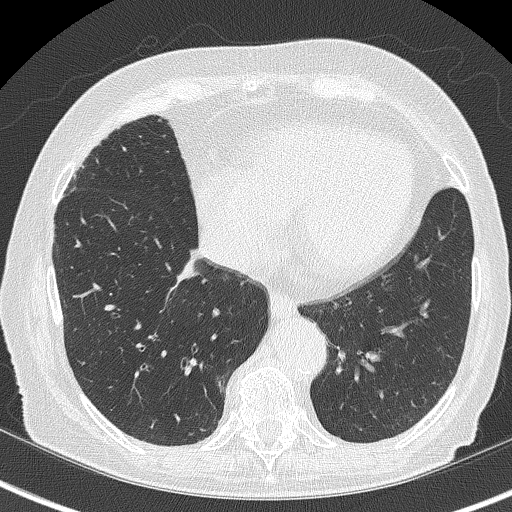
[im 138/303  lung]
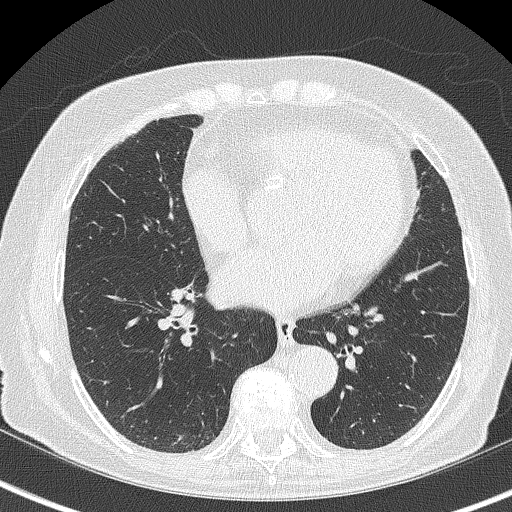
[im 165/303  lung]
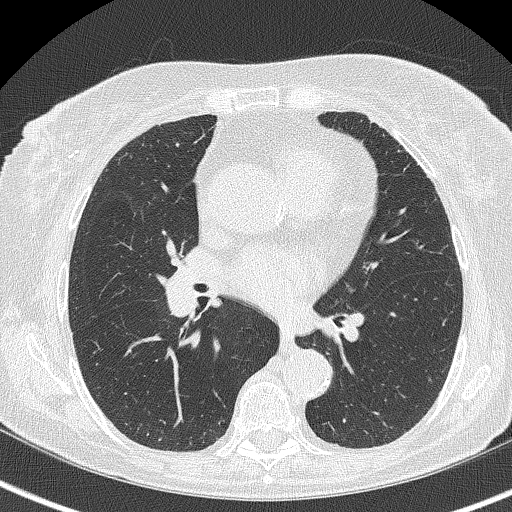
[im 193/303  lung]
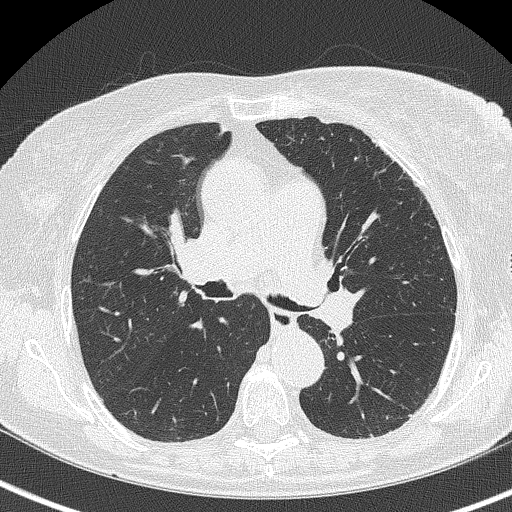
[im 206/303  mediastinal]
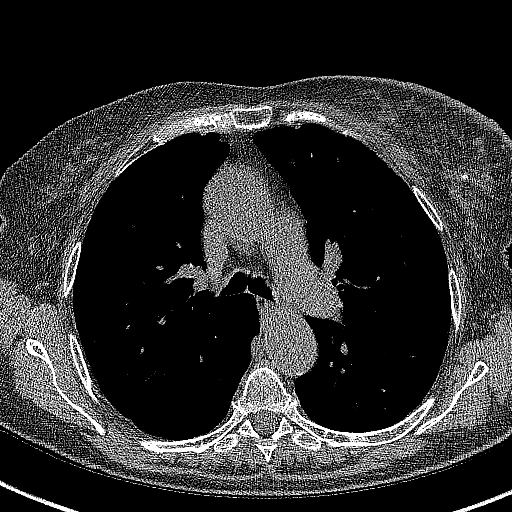
[im 206/303  lung]
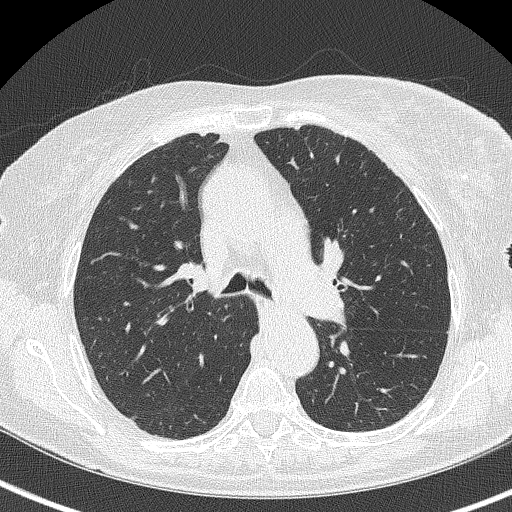
[im 234/303  lung]
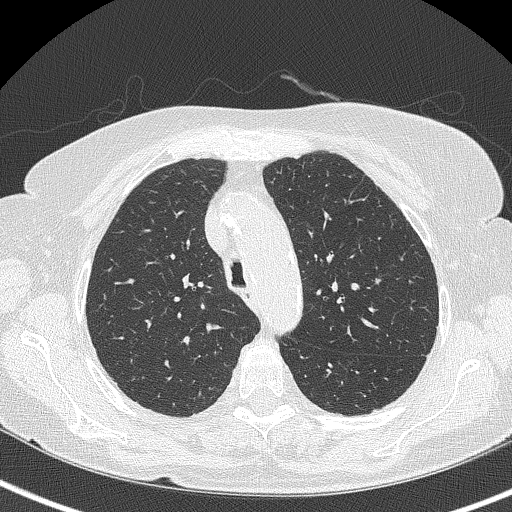
[im 261/303  lung]
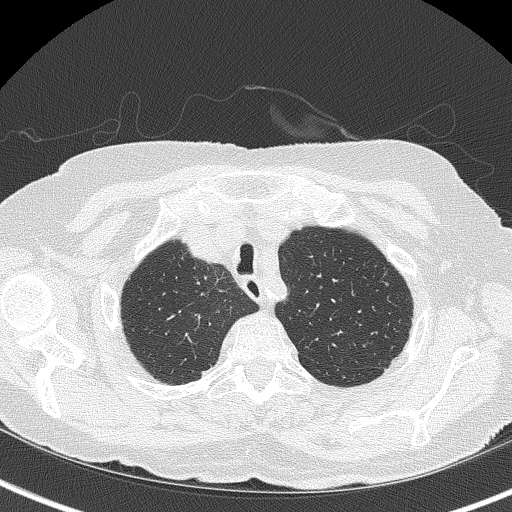
[im 289/303  lung]
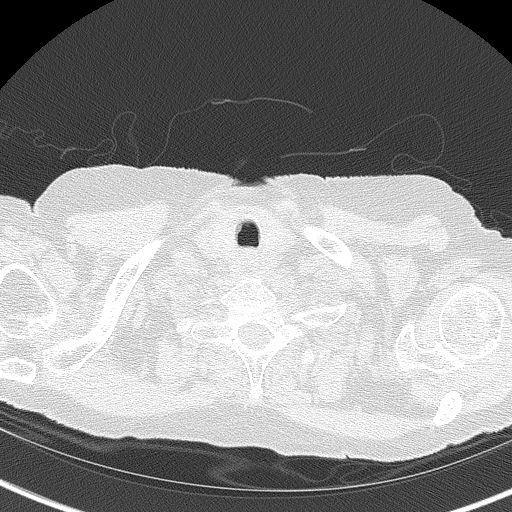

[Series 5: coronals lung 1.00 cor · coronal · 0.59mm/px · 3 of 280 slices shown]
[im 56/280  lung]
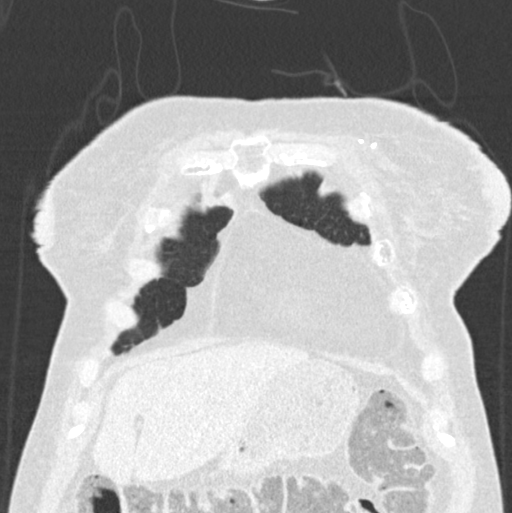
[im 112/280  lung]
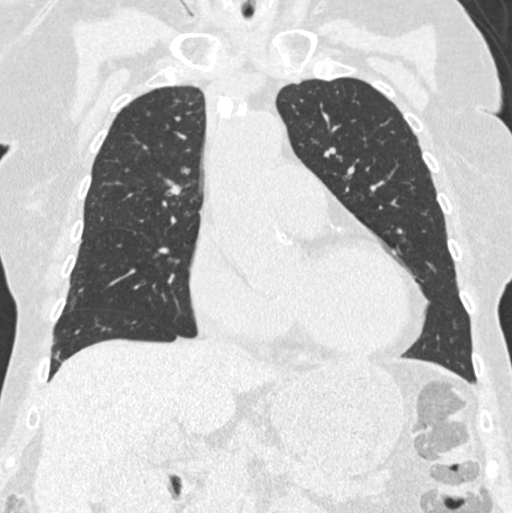
[im 168/280  lung]
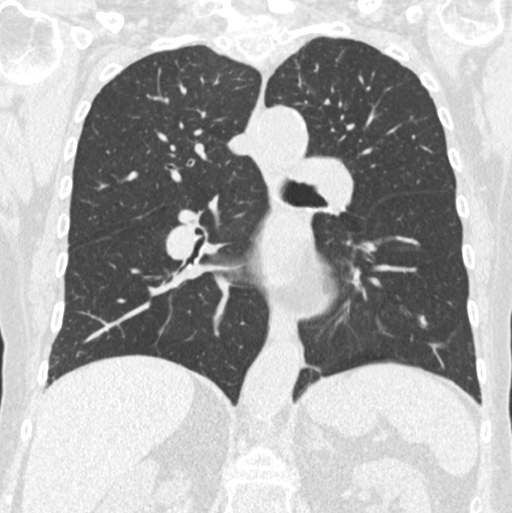

[15 of 40 positions shown; findings below may reference images not displayed]

FINDINGS: Cardiovascular: The heart size is normal. No substantial pericardial
effusion. Coronary artery calcification is evident. Mild
atherosclerotic calcification is noted in the wall of the thoracic
aorta. Ascending thoracic aorta measures 4.1 cm diameter.

Mediastinum/Nodes: No mediastinal lymphadenopathy. No evidence for
gross hilar lymphadenopathy although assessment is limited by the
lack of intravenous contrast on the current study. The esophagus has
normal imaging features. There is no axillary lymphadenopathy.

Lungs/Pleura: Centrilobular emphsyema noted. There is a new
subpleural 7.9 mm right upper lobe nodule on image 45, potentially
atelectasis. Otherwise, no new suspicious pulmonary nodule or mass.
No focal airspace consolidation. No pleural effusion.

Upper Abdomen: Incomplete characterization of right renal lesions
due to incomplete visualization of lack of contrast material. The
anterior interpolar lesion shows continued interval increase in size
now measuring 2.8 cm compared to 2.6 cm previously. Attenuation of
this lesion is higher than would be expected for a simple cyst. This
lesion was characterized by CT scan without and with contrast on
02/05/2020 as Bosniak II cyst.

Musculoskeletal: No worrisome lytic or sclerotic osseous
abnormality.
IMPRESSION: 1. Lung-RADS 4A, suspicious. New 7.9 mm subpleural right upper lobe
pulmonary nodule, potentially atelectasis. Follow up low-dose chest
CT without contrast in 3 months (please use the following order, "CT
CHEST LCS NODULE FOLLOW-UP W/O CM") is recommended. Alternatively,
PET may be considered when there is a solid component 8mm or larger.
2. Ascending thoracic aorta measures 4.1 cm diameter. Attention on
follow-up lung cancer screening CT recommended.
3.  Emphysema (CMR3H-ZNZ.0) and Aortic Atherosclerosis (CMR3H-170.0)

These results will be called to the ordering clinician or
representative by the Radiologist Assistant, and communication
documented in the PACS or [REDACTED].

## 2023-05-07 ENCOUNTER — Telehealth: Payer: Self-pay

## 2023-05-07 NOTE — Patient Instructions (Signed)
Visit Information  Thank you for taking time to visit with me today. Please don't hesitate to contact me if I can be of assistance to you.   Following are the goals we discussed today:   Goals Addressed             This Visit's Progress    Post COVID management       Interventions Today    Flowsheet Row Most Recent Value  Chronic Disease   Chronic disease during today's visit Other  [mild cognitive impairment, loss of smell]  General Interventions   General Interventions Discussed/Reviewed General Interventions Discussed, Doctor Visits  [evaluation of current treatment plan for mild cognitive decline/ loss of smell and patients adherence to plan as established by provider.  Assessed for memory status.]  Doctor Visits Discussed/Reviewed Doctor Visits Discussed  [reviewed upcoming provider visits. Advised patient to notify provider of ongoing symptoms unresolved with treatment]  Pharmacy Interventions   Pharmacy Dicussed/Reviewed Pharmacy Topics Discussed  [medications reviewed. Advised to take medications as prescribed]              Our next appointment is by telephone on 06/04/23 at 1:30 pm  Please call the care guide team at [phone] if you need to cancel or reschedule your appointment.   If you are experiencing a Mental Health or Behavioral Health Crisis or need someone to talk to, please call the Suicide and Crisis Lifeline: 988 call 1-800-273-TALK (toll free, 24 hour hotline)  The patient verbalized understanding of instructions, educational materials, and care plan provided today and agreed to receive a mailed copy of patient instructions, educational materials, and care plan.   George Ina RN,BSN,CCM [facility] Care Coordination [phone] direct line

## 2023-05-07 NOTE — Patient Outreach (Signed)
  Care Coordination   Initial Visit Note   05/07/2023 Name: Melanie Schneider MRN: [id number] DOB: [birth year]  Melanie Schneider is a 77 y.o. year old female who sees Excell Seltzer, MD for primary care. I spoke with  [name] by phone today.  What matters to the patients health and wellness today?  Patient states she is having difficulty with her memory and smell since having COVID. She states her provider advised her to use essential oil to assist with loss of smell and take vitamin B12 and D to help with memory.  Patient states the essential oils does not help with her smelling and she continues to have memory issues.  Patient states her husband drives for her now because she occasionally forgets where she is going. Patient aware she has follow up with primary care provider on 06/01/23.    Goals Addressed             This Visit's Progress    Post COVID management       Interventions Today    Flowsheet Row Most Recent Value  Chronic Disease   Chronic disease during today's visit Other  [mild cognitive impairment, loss of smell]  General Interventions   General Interventions Discussed/Reviewed General Interventions Discussed, Doctor Visits  [evaluation of current treatment plan for mild cognitive decline/ loss of smell and patients adherence to plan as established by provider.  Assessed for memory status.]  Doctor Visits Discussed/Reviewed Doctor Visits Discussed  [reviewed upcoming provider visits. Advised patient to notify provider of ongoing symptoms unresolved with treatment]  Pharmacy Interventions   Pharmacy Dicussed/Reviewed Pharmacy Topics Discussed  [medications reviewed. Advised to take medications as prescribed]              SDOH assessments and interventions completed:  Yes  SDOH Interventions Today    Flowsheet Row Most Recent Value  SDOH Interventions   Food Insecurity Interventions Intervention Not Indicated  Housing Interventions  Intervention Not Indicated  Transportation Interventions Intervention Not Indicated        Care Coordination Interventions:  Yes, provided   Follow up plan: Follow up call scheduled for 06/04/23    Encounter Outcome:  Pt. Visit Completed   George Ina RN,BSN,CCM [facility] Care Coordination [phone] direct line

## 2023-05-15 ENCOUNTER — Telehealth: Payer: Self-pay

## 2023-05-15 NOTE — Patient Outreach (Signed)
  Care Coordination   Initial Visit Note   05/15/2023 Name: Melanie Schneider MRN: [id number] DOB: 01-26-1946  Melanie Schneider is a 77 y.o. year old female who sees Excell Seltzer, MD for primary care. I spoke with  Melanie Schneider by phone today.  What matters to the patients health and wellness today?  Patient denies any care coordination and / or community resource needs at this time.     Goals Addressed             This Visit's Progress    COMPLETED: care coordination activities - no follow up needed       Interventions Today    Flowsheet Row Most Recent Value  General Interventions   General Interventions Discussed/Reviewed General Interventions Discussed  [returned call to patient as requested.  Provided patient contact phone number and name for RN case manager.  Advised to contact RN case manager if care coordination services needed in the future.]               SDOH assessments and interventions completed:  No SDOH completed within the last 30 days.    Care Coordination Interventions:  Yes, provided   Follow up plan: No further intervention required.   Encounter Outcome:  Pt. Visit Completed   George Ina RN,BSN,CCM [facility] Care Coordination [phone] direct line

## 2023-06-01 ENCOUNTER — Ambulatory Visit (INDEPENDENT_AMBULATORY_CARE_PROVIDER_SITE_OTHER): Payer: Medicare HMO | Admitting: Family Medicine

## 2023-06-01 ENCOUNTER — Encounter: Payer: Self-pay | Admitting: Family Medicine

## 2023-06-01 VITALS — BP 150/80 | HR 65 | Temp 100.0°F | Ht 61.25 in | Wt 155.2 lb

## 2023-06-01 DIAGNOSIS — G3184 Mild cognitive impairment, so stated: Secondary | ICD-10-CM | POA: Diagnosis not present

## 2023-06-01 MED ORDER — DONEPEZIL HCL 5 MG PO TBDP: 5.0000 mg | ORAL_TABLET | Freq: Every day | ORAL | 11 refills | Status: DC

## 2023-06-01 MED ORDER — ATORVASTATIN CALCIUM 40 MG PO TABS: 40.0000 mg | ORAL_TABLET | Freq: Every day | ORAL | 2 refills | Status: AC

## 2023-06-01 NOTE — Assessment & Plan Note (Signed)
Chronic, stable per patient and husband MMSE stable 30 out of 30 as well. Continue vitamin D and vitamin B12 supplementation. Some cognitive impairment may be secondary to mild to moderate chronic microvascular ischemic changes seen on MRI 2023.  Continue statin, non-smoking and heart healthy diet for prevention of progression. We will also start Aricept for delay of progression in case some of her symptoms are more due to Alzheimer's type dementia. No current interest in neurology referral.  Reevaluate at wellness in 8 months.

## 2023-06-01 NOTE — Progress Notes (Signed)
Patient ID: Melanie Schneider, female    DOB: 01/09/1946, 77 y.o.   MRN: [id number]  This visit was conducted in person.  BP (!) 150/80 (BP Location: Right Arm, Patient Position: Sitting, Cuff Size: Normal)   Pulse 65   Temp 100 F (37.8 C) (Temporal)   Ht 5' 1.25" (1.556 m)   Wt 155 lb 4 oz (70.4 kg)   SpO2 97%   BMI 29.10 kg/m    CC:  Chief Complaint  Patient presents with   Memory Loss   Loss of Smell    Subjective:   HPI: Melanie Schneider is a 77 y.o. female presenting on 06/01/2023 for Memory Loss and Loss of Smell  At last office visit March 01, 2023 she had noted gradual progressive changes over the last year of memory loss. Lab evaluation showed both low vitamin D and low B12 now on supplementation. Mini-Mental status exam 30 out of 30, normal clock drawing, animal recall 6 and 15 seconds.   Of note she had MRI brain March 19, 2022 with Dr. Jenne Campus for evaluation of anosmia. IMPRESSION: No evidence of recent infarction, hemorrhage, or mass. No abnormal enhancement.   Mild to moderate chronic microvascular ischemic changes.     She and husband report that memory is not any worse but no better.   She is out of cholesterol medication LDL 73.. goal 70 at last check in 01/2023.  Relevant past medical, surgical, family and social history reviewed and updated as indicated. Interim medical history since our last visit reviewed. Allergies and medications reviewed and updated. Outpatient Medications Prior to Visit  Medication Sig Dispense Refill   atorvastatin (LIPITOR) 40 MG tablet Take 1 tablet by mouth once daily 90 tablet 2   Cholecalciferol (VITAMIN D3) 1.25 MG (50000 UT) CAPS Take 1 capsule (1.25 mg total) by mouth once a week. 12 capsule 1   MELATONIN ER PO Take 1-2 each by mouth at bedtime as needed.     Multiple Vitamin (MULTIVITAMIN) tablet Take 1 tablet by mouth daily.     polyethylene glycol powder (GLYCOLAX/MIRALAX) 17 GM/SCOOP powder Take 1  Container by mouth daily as needed.     vitamin B-12 (CYANOCOBALAMIN) 1000 MCG tablet Take 1,000 mcg by mouth daily.     Facility-Administered Medications Prior to Visit  Medication Dose Route Frequency Provider Last Rate Last Admin   SONAFINE emulsion 1 application  1 application  Topical BID Ronny Bacon, PA-C   1 application  at 10/02/17 1521     Per HPI unless specifically indicated in ROS section below Review of Systems  Constitutional:  Negative for fatigue and fever.  HENT:  Negative for congestion.   Eyes:  Negative for pain.  Respiratory:  Negative for cough and shortness of breath.   Cardiovascular:  Negative for chest pain, palpitations and leg swelling.  Gastrointestinal:  Negative for abdominal pain.  Genitourinary:  Negative for dysuria and vaginal bleeding.  Musculoskeletal:  Negative for back pain.  Neurological:  Negative for syncope, light-headedness and headaches.  Psychiatric/Behavioral:  Negative for dysphoric mood.    Objective:  BP (!) 150/80 (BP Location: Right Arm, Patient Position: Sitting, Cuff Size: Normal)   Pulse 65   Temp 100 F (37.8 C) (Temporal)   Ht 5' 1.25" (1.556 m)   Wt 155 lb 4 oz (70.4 kg)   SpO2 97%   BMI 29.10 kg/m   Wt Readings from Last 3 Encounters:  06/01/23 155 lb 4 oz (70.4 kg)  04/11/23 161 lb (73 kg)  03/21/23 149 lb 4 oz (67.7 kg)      Physical Exam Constitutional:      General: She is not in acute distress.    Appearance: Normal appearance. She is well-developed. She is not ill-appearing or toxic-appearing.  HENT:     Head: Normocephalic.     Right Ear: Hearing, tympanic membrane, ear canal and external ear normal. Tympanic membrane is not erythematous, retracted or bulging.     Left Ear: Hearing, tympanic membrane, ear canal and external ear normal. Tympanic membrane is not erythematous, retracted or bulging.     Nose: No mucosal edema or rhinorrhea.     Right Sinus: No maxillary sinus tenderness or frontal  sinus tenderness.     Left Sinus: No maxillary sinus tenderness or frontal sinus tenderness.     Mouth/Throat:     Mouth: Oropharynx is clear and moist and mucous membranes are normal.     Pharynx: Uvula midline.  Eyes:     General: Lids are normal. Lids are everted, no foreign bodies appreciated.     Extraocular Movements: EOM normal.     Conjunctiva/sclera: Conjunctivae normal.     Pupils: Pupils are equal, round, and reactive to light.  Neck:     Thyroid: No thyroid mass or thyromegaly.     Vascular: No carotid bruit.     Trachea: Trachea normal.  Cardiovascular:     Rate and Rhythm: Normal rate and regular rhythm.     Pulses: Normal pulses.     Heart sounds: Normal heart sounds, S1 normal and S2 normal. No murmur heard.    No friction rub. No gallop.  Pulmonary:     Effort: Pulmonary effort is normal. No tachypnea or respiratory distress.     Breath sounds: Normal breath sounds. No decreased breath sounds, wheezing, rhonchi or rales.  Abdominal:     General: Bowel sounds are normal.     Palpations: Abdomen is soft.     Tenderness: There is no abdominal tenderness.  Musculoskeletal:     Cervical back: Normal range of motion and neck supple.  Skin:    General: Skin is warm, dry and intact.     Findings: No rash.  Neurological:     Mental Status: She is alert and oriented to person, place, and time.     GCS: GCS eye subscore is 4. GCS verbal subscore is 5. GCS motor subscore is 6.     Cranial Nerves: No cranial nerve deficit.     Sensory: No sensory deficit.     Motor: No abnormal muscle tone.     Coordination: Coordination normal.     Gait: Gait normal.     Deep Tendon Reflexes: Reflexes are normal and symmetric.     Comments: Nml cerebellar exam   No papilledema  MMSE 30/30  Psychiatric:        Mood and Affect: Mood and affect normal. Mood is not anxious or depressed.        Speech: Speech normal.        Behavior: Behavior normal. Behavior is cooperative.         Thought Content: Thought content normal.        Cognition and Memory: Cognition and memory normal. Memory is not impaired. She does not exhibit impaired recent memory or impaired remote memory.        Judgment: Judgment normal.       Results for orders placed or performed in visit on  04/11/23  CULTURE, URINE COMPREHENSIVE   Specimen: Urine   UR  Result Value Ref Range   Urine Culture, Comprehensive Final report (A)    Organism ID, Bacteria Comment (A)    ANTIMICROBIAL SUSCEPTIBILITY Comment   Microscopic Examination   Urine  Result Value Ref Range   WBC, UA 0-5 0 - 5 /hpf   RBC, Urine None seen 0 - 2 /hpf   Epithelial Cells (non renal) 0-10 0 - 10 /hpf   Casts Present (A) None seen /lpf   Cast Type Hyaline casts N/A   Mucus, UA Present (A) Not Estab.   Bacteria, UA Many (A) None seen/Few  Urinalysis, Complete  Result Value Ref Range   Specific Gravity, UA 1.020 1.005 - 1.030   pH, UA 6.5 5.0 - 7.5   Color, UA Yellow Yellow   Appearance Ur Cloudy (A) Clear   Leukocytes,UA Trace (A) Negative   Protein,UA Trace Negative/Trace   Glucose, UA Negative Negative   Ketones, UA Trace (A) Negative   RBC, UA Negative Negative   Bilirubin, UA Negative Negative   Urobilinogen, Ur 2.0 (H) 0.2 - 1.0 mg/dL   Nitrite, UA Positive (A) Negative   Microscopic Examination See below:     Assessment and Plan  There are no diagnoses linked to this encounter.  No follow-ups on file.   Kerby Nora, MD

## 2023-06-01 NOTE — Patient Instructions (Signed)
Continue daily B12 1000 mcg daily.  Start vit D 3 2000 international  daily

## 2023-06-04 ENCOUNTER — Ambulatory Visit: Payer: Self-pay

## 2023-06-04 NOTE — Patient Outreach (Signed)
  Care Coordination   Follow Up Visit Note   06/04/2023 Name: Melanie Schneider MRN: [id number] DOB: 03-21-1946  Melanie Schneider is a 77 y.o. year old female who sees Excell Seltzer, MD for primary care. I spoke with  Melanie Schneider by phone today.  What matters to the patients health and wellness today?  Patient reports having follow up visit with provider on 06/01/23. She states she was prescribed aricept for her memory issues.  Patient states she is taking vitamin D and B12.  Patient states she only drives to her mailbox. She states her husband helps to remind her about her medications    Goals Addressed             This Visit's Progress    Post COVID management/ mild cognitive impairment and loss of smell       Interventions Today    Flowsheet Row Most Recent Value  Chronic Disease   Chronic disease during today's visit Other  [mild cognitive impairment]  General Interventions   General Interventions Discussed/Reviewed General Interventions Reviewed, Doctor Visits  [evaluation of current treatment plan for mild cognitivie impairment and  patients adherence to plan as established by provider.]  Doctor Visits Discussed/Reviewed Doctor Visits Reviewed  [reviewed/ discussed provider visit from 06/01/23.  Advised to keep follow up appointments with providers as recommended.  advised to notify provider for worsening symptoms.]  Education Interventions   Education Provided Provided Education  [Reviewed provider instructions from 06/01/23 office visit:  Continue vitamin D and B12.  Advised to keep notepad, continue to read and do crossword puzzels, use calendar for appointments, set alarms as needed.]  Provided Verbal Education On Other  [Advised to keep a routine and keep living enviornment free of clutter.]  Pharmacy Interventions   Pharmacy Dicussed/Reviewed Pharmacy Topics Reviewed  [medications reviewed.  Advised to take aricept as prescribed by provider. Advised to keep  list of medications on refrigerator or with her and use alarms when medications are due.]              SDOH assessments and interventions completed:  No     Care Coordination Interventions:  Yes, provided   Follow up plan: Follow up call scheduled for 07/16/23    Encounter Outcome:  Patient Visit Completed   Melanie Ina RN,BSN,CCM [facility] Care Coordination [phone] direct line

## 2023-06-04 NOTE — Patient Instructions (Signed)
Visit Information  Thank you for taking time to visit with me today. Please don't hesitate to contact me if I can be of assistance to you.   Following are the goals we discussed today:   Goals Addressed             This Visit's Progress    Post COVID management/ mild cognitive impairment and loss of smell       Interventions Today    Flowsheet Row Most Recent Value  Chronic Disease   Chronic disease during today's visit Other  [mild cognitive impairment]  General Interventions   General Interventions Discussed/Reviewed General Interventions Reviewed, Doctor Visits  [evaluation of current treatment plan for mild cognitivie impairment and  patients adherence to plan as established by provider.]  Doctor Visits Discussed/Reviewed Doctor Visits Reviewed  [reviewed/ discussed provider visit from 06/01/23.  Advised to keep follow up appointments with providers as recommended.  advised to notify provider for worsening symptoms.]  Education Interventions   Education Provided Provided Education  [Reviewed provider instructions from 06/01/23 office visit:  Continue vitamin D and B12.  Advised to keep notepad, continue to read and do crossword puzzels, use calendar for appointments, set alarms as needed.]  Provided Verbal Education On Other  [Advised to keep a routine and keep living enviornment free of clutter.]  Pharmacy Interventions   Pharmacy Dicussed/Reviewed Pharmacy Topics Reviewed  [medications reviewed.  Advised to take aricept as prescribed by provider. Advised to keep list of medications on refrigerator or with her and use alarms when medications are due.]              Our next appointment is by telephone on 07/16/23 at 1:30 pm  Please call the care guide team at [phone] if you need to cancel or reschedule your appointment.   If you are experiencing a Mental Health or Behavioral Health Crisis or need someone to talk to, please call the Suicide and Crisis Lifeline: 988 call  1-800-273-TALK (toll free, 24 hour hotline)  The patient verbalized understanding of instructions, educational materials, and care plan provided today and agreed to receive a mailed copy of patient instructions, educational materials, and care plan.   George Ina RN,BSN,CCM [facility] Care Coordination [phone] direct line   Management of Memory Problems  There are some general things you can do to help manage your memory problems.  Your memory may not in fact recover, but by using techniques and strategies you will be able to manage your memory difficulties better.  1)  Establish a routine. Try to establish and then stick to a regular routine.  By doing this, you will get used to what to expect and you will reduce the need to rely on your memory.  Also, try to do things at the same time of day, such as taking your medication or checking your calendar first thing in the morning. Think about think that you can do as a part of a regular routine and make a list.  Then enter them into a daily planner to remind you.  This will help you establish a routine.  2)  Organize your environment. Organize your environment so that it is uncluttered.  Decrease visual stimulation.  Place everyday items such as keys or cell phone in the same place every day (ie.  Basket next to front door) Use post it notes with a brief message to yourself (ie. Turn off light, lock the door) Use labels to indicate where things go (ie. Which cupboards are  for food, dishes, etc.) Keep a notepad and pen by the telephone to take messages  3)  Memory Aids A diary or journal/notebook/daily planner Making a list (shopping list, chore list, to do list that needs to be done) Using an alarm as a reminder (kitchen timer or cell phone alarm) Using cell phone to store information (Notes, Calendar, Reminders) Calendar/White board placed in a prominent position Post-it notes  In order for memory aids to be useful, you need to have good  habits.  It's no good remembering to make a note in your journal if you don't remember to look in it.  Try setting aside a certain time of day to look in journal.  4)  Improving mood and managing fatigue. There may be other factors that contribute to memory difficulties.  Factors, such as anxiety, depression and tiredness can affect memory. Regular gentle exercise can help improve your mood and give you more energy. Simple relaxation techniques may help relieve symptoms of anxiety Try to get back to completing activities or hobbies you enjoyed doing in the past. Learn to pace yourself through activities to decrease fatigue. Find out about some local support groups where you can share experiences with others. Try and achieve 7-8 hours of sleep at night.

## 2023-07-16 ENCOUNTER — Ambulatory Visit: Payer: Self-pay

## 2023-07-16 NOTE — Patient Outreach (Signed)
Care Coordination   Follow Up Visit Note   07/16/2023 Name: Melanie Schneider MRN: [id number] DOB: 25-Feb-1946  Melanie Schneider is a 77 y.o. year old female who sees Excell Seltzer, MD for primary care. I spoke with  Melanie Schneider by phone today.  What matters to the patients health and wellness today?  Patient states she is tolerating medication Aricept.  She reports using discussed strategies to manage her memory issues.   Denies any new issues or concerns and is agreeable that care coordination goals have been met.     Goals Addressed             This Visit's Progress    COMPLETED: Post COVID management/ mild cognitive impairment and loss of smell       Interventions Today    Flowsheet Row Most Recent Value  Chronic Disease   Chronic disease during today's visit Other  [mild cognitive impairment]  General Interventions   General Interventions Discussed/Reviewed General Interventions Reviewed, Doctor Visits  [evaluation of current treatment plan for mild cognitive impairment and patients adherence to plans as established by provider. Assessed for new symptoms]  Doctor Visits Discussed/Reviewed Doctor Visits Reviewed  Melanie Schneider upcoming provider visits.]  Education Interventions   Education Provided --  [Confirmed patient has RN case Airline pilot phone number. Advised to contact primary provider or RN case manager if care coordination services are needed in the future.]  Mental Health Interventions   Mental Health Discussed/Reviewed Other  [Advised to continue keep notepad, continue to read and do crossword puzzels, use calendar for appointments, set alarms as needed]  Pharmacy Interventions   Pharmacy Dicussed/Reviewed Pharmacy Topics Reviewed  Melanie Schneider to take medications as prescribed]              SDOH assessments and interventions completed:  No     Care Coordination Interventions:  Yes, provided   Follow up plan: No further intervention  required.   Encounter Outcome:  Patient Visit Completed   Melanie Schneider [facility] Care Coordination [phone] direct line

## 2023-07-16 NOTE — Patient Instructions (Signed)
Visit Information  Thank you for taking time to visit with me today. Your care coordination goals have been met.   Following are the goals we discussed today:   Goals Addressed             This Visit's Progress    COMPLETED: Post COVID management/ mild cognitive impairment and loss of smell       Interventions Today    Flowsheet Row Most Recent Value  Chronic Disease   Chronic disease during today's visit Other  [mild cognitive impairment]  General Interventions   General Interventions Discussed/Reviewed General Interventions Reviewed, Doctor Visits  [evaluation of current treatment plan for mild cognitive impairment and patients adherence to plans as established by provider. Assessed for new symptoms]  Doctor Visits Discussed/Reviewed Doctor Visits Reviewed  Annabell Sabal upcoming provider visits.]  Education Interventions   Education Provided --  [Confirmed patient has RN case Airline pilot phone number. Advised to contact primary provider or RN case manager if care coordination services are needed in the future.]  Mental Health Interventions   Mental Health Discussed/Reviewed Other  [Advised to continue keep notepad, continue to read and do crossword puzzels, use calendar for appointments, set alarms as needed]  Pharmacy Interventions   Pharmacy Dicussed/Reviewed Pharmacy Topics Reviewed  Algis Downs to take medications as prescribed]              Please contact your primary care provider or RN case manager if care coordination services are needed in the future.   If you are experiencing a Mental Health or Behavioral Health Crisis or need someone to talk to, please call the Suicide and Crisis Lifeline: 988 call 1-800-273-TALK (toll free, 24 hour hotline)  The patient verbalized understanding of instructions, educational materials, and care plan provided today and agreed to receive a mailed copy of patient instructions, educational materials, and care plan.   Melanie Ina  RN,BSN,CCM [facility] Care Coordination [phone] direct line

## 2023-08-06 DIAGNOSIS — Z1231 Encounter for screening mammogram for malignant neoplasm of breast: Secondary | ICD-10-CM | POA: Diagnosis not present

## 2023-08-06 LAB — HM MAMMOGRAPHY

## 2023-08-08 ENCOUNTER — Encounter: Payer: Self-pay | Admitting: Family Medicine

## 2023-09-07 ENCOUNTER — Ambulatory Visit
Admission: RE | Admit: 2023-09-07 | Discharge: 2023-09-07 | Disposition: A | Discharge status: Home / Self Care | Payer: Medicare HMO | Source: Ambulatory Visit | Attending: Acute Care | Admitting: Acute Care

## 2023-09-07 DIAGNOSIS — Z122 Encounter for screening for malignant neoplasm of respiratory organs: Secondary | ICD-10-CM | POA: Insufficient documentation

## 2023-09-07 DIAGNOSIS — Z87891 Personal history of nicotine dependence: Secondary | ICD-10-CM | POA: Diagnosis not present

## 2023-10-03 ENCOUNTER — Ambulatory Visit (INDEPENDENT_AMBULATORY_CARE_PROVIDER_SITE_OTHER): Payer: Medicare Other | Admitting: Family Medicine

## 2023-10-03 ENCOUNTER — Encounter: Payer: Self-pay | Admitting: Family Medicine

## 2023-10-03 VITALS — BP 122/60 | HR 64 | Temp 98.0°F | Ht 61.2 in | Wt 152.2 lb

## 2023-10-03 DIAGNOSIS — G3184 Mild cognitive impairment, so stated: Secondary | ICD-10-CM | POA: Diagnosis not present

## 2023-10-03 DIAGNOSIS — I700 Atherosclerosis of aorta: Secondary | ICD-10-CM | POA: Diagnosis not present

## 2023-10-03 DIAGNOSIS — I2510 Atherosclerotic heart disease of native coronary artery without angina pectoris: Secondary | ICD-10-CM | POA: Diagnosis not present

## 2023-10-03 DIAGNOSIS — J449 Chronic obstructive pulmonary disease, unspecified: Secondary | ICD-10-CM | POA: Diagnosis not present

## 2023-10-03 NOTE — Patient Instructions (Addendum)
 Start baby aspirin 81 mg daily.  Return for cholesterol recheck.  Referral placed for cardiology.  Continue aricept for 5 mg daily.

## 2023-10-03 NOTE — Assessment & Plan Note (Signed)
 Has quit smoking. Chronic, stable control using albuterol as needed.  No clear indication for controller.

## 2023-10-03 NOTE — Progress Notes (Signed)
 Patient ID: Melanie Schneider, female    DOB: [birth year], 78 y.o.   MRN: [id number]  This visit was conducted in person.  BP 122/60   Pulse 64   Temp 98 F (36.7 C) (Oral)   Ht 5' 1.2 (1.554 m)   Wt 152 lb 3.2 oz (69 kg)   SpO2 96%   BMI 28.57 kg/m    CC:  Chief Complaint  Patient presents with   Lung Cancer Screening    See letter attached    Medication Consultation    Would like to make sure that she is taking the correct medication and if any needs to be changed    Memory Loss    Having hard time remembering things. Not sure if the aricept  is helping     Subjective:   HPI: Melanie Schneider is a 78 y.o. female presenting on 10/03/2023 for Lung Cancer Screening (See letter attached ), Medication Consultation (Would like to make sure that she is taking the correct medication and if any needs to be changed ), and Memory Loss (Having hard time remembering things. Not sure if the aricept  is helping )  Mild cognitive impairment: At last office visit September 2024 Mini-Mental status exam was 30 out of 30. She was continued on vitamin D  and B12 supplementation Felt mild cognitive impairment could be secondary to chronic microvascular ischemic changes seen on MRI in 2023.  Started on Aricept  low-dose 5mg  Was not interested in neurology referral at that time.   Today she reports   she thinks she has been taking vit D  50,000 U every day...  but  after talking it seems like she is taking OTC med vit D so daily is fine. She has not had any worsening of memory but as expected has not noticed improvement with initiation of Aricept .  Reviewed chest CT lung cancer screening from September 07, 2023 benign Evidence of aortic atherosclerosis in addition to left anterior descending coronary artery disease Also evidence of bronchial wall thickening suggestive of underlying COPD. She has already been diagnosed with COPD and is not currently on a controller. he is not SOB,  occ  cough. New finding of left anterior descending coronary artery disease: Risk factor modification with statin, not sure why she is not currently on aspirin  No chest pain.  LDL 73 01/2022  Wt Readings from Last 3 Encounters:  10/03/23 152 lb 3.2 oz (69 kg)  09/07/23 135 lb (61.2 kg)  06/01/23 155 lb 4 oz (70.4 kg)    Relevant past medical, surgical, family and social history reviewed and updated as indicated. Interim medical history since our last visit reviewed. Allergies and medications reviewed and updated. Outpatient Medications Prior to Visit  Medication Sig Dispense Refill   atorvastatin  (LIPITOR) 40 MG tablet Take 1 tablet (40 mg total) by mouth daily. 90 tablet 2   Cholecalciferol (VITAMIN D3) 1.25 MG (50000 UT) CAPS Take 1 capsule (1.25 mg total) by mouth once a week. 12 capsule 1   donepezil  (ARICEPT  ODT) 5 MG disintegrating tablet Take 1 tablet (5 mg total) by mouth at bedtime. 30 tablet 11   MELATONIN ER PO Take 1-2 each by mouth at bedtime as needed.     Multiple Vitamin (MULTIVITAMIN) tablet Take 1 tablet by mouth daily.     polyethylene glycol powder (GLYCOLAX/MIRALAX) 17 GM/SCOOP powder Take 1 Container by mouth daily as needed.     vitamin B-12 (CYANOCOBALAMIN ) 1000 MCG tablet Take 1,000 mcg by  mouth daily.     Facility-Administered Medications Prior to Visit  Medication Dose Route Frequency Provider Last Rate Last Admin   SONAFINE emulsion 1 application  1 application  Topical BID Lanell Donald Stagger, PA-C   1 application  at 10/02/17 1521     Per HPI unless specifically indicated in ROS section below Review of Systems  Constitutional:  Negative for fatigue and fever.  HENT:  Negative for congestion.   Eyes:  Negative for pain.  Respiratory:  Negative for cough and shortness of breath.   Cardiovascular:  Negative for chest pain, palpitations and leg swelling.  Gastrointestinal:  Negative for abdominal pain.  Genitourinary:  Negative for dysuria and vaginal  bleeding.  Musculoskeletal:  Negative for back pain.  Neurological:  Negative for syncope, light-headedness and headaches.  Psychiatric/Behavioral:  Negative for dysphoric mood.    Objective:  BP 122/60   Pulse 64   Temp 98 F (36.7 C) (Oral)   Ht 5' 1.2 (1.554 m)   Wt 152 lb 3.2 oz (69 kg)   SpO2 96%   BMI 28.57 kg/m   Wt Readings from Last 3 Encounters:  10/03/23 152 lb 3.2 oz (69 kg)  09/07/23 135 lb (61.2 kg)  06/01/23 155 lb 4 oz (70.4 kg)      Physical Exam Constitutional:      General: She is not in acute distress.    Appearance: Normal appearance. She is well-developed. She is not ill-appearing or toxic-appearing.  HENT:     Head: Normocephalic.     Right Ear: Hearing, tympanic membrane, ear canal and external ear normal. Tympanic membrane is not erythematous, retracted or bulging.     Left Ear: Hearing, tympanic membrane, ear canal and external ear normal. Tympanic membrane is not erythematous, retracted or bulging.     Nose: No mucosal edema or rhinorrhea.     Right Sinus: No maxillary sinus tenderness or frontal sinus tenderness.     Left Sinus: No maxillary sinus tenderness or frontal sinus tenderness.     Mouth/Throat:     Pharynx: Uvula midline.  Eyes:     General: Lids are normal. Lids are everted, no foreign bodies appreciated.     Conjunctiva/sclera: Conjunctivae normal.     Pupils: Pupils are equal, round, and reactive to light.  Neck:     Thyroid : No thyroid  mass or thyromegaly.     Vascular: No carotid bruit.     Trachea: Trachea normal.  Cardiovascular:     Rate and Rhythm: Normal rate and regular rhythm.     Pulses: Normal pulses.     Heart sounds: Normal heart sounds, S1 normal and S2 normal. No murmur heard.    No friction rub. No gallop.  Pulmonary:     Effort: Pulmonary effort is normal. No tachypnea or respiratory distress.     Breath sounds: Normal breath sounds. No decreased breath sounds, wheezing, rhonchi or rales.  Abdominal:      General: Bowel sounds are normal.     Palpations: Abdomen is soft.     Tenderness: There is no abdominal tenderness.  Musculoskeletal:     Cervical back: Normal range of motion and neck supple.  Skin:    General: Skin is warm and dry.     Findings: No rash.  Neurological:     Mental Status: She is alert.  Psychiatric:        Mood and Affect: Mood is not anxious or depressed.        Speech:  Speech normal.        Behavior: Behavior normal. Behavior is cooperative.        Thought Content: Thought content normal.        Judgment: Judgment normal.       Results for orders placed or performed in visit on 08/08/23  HM MAMMOGRAPHY   Collection Time: 08/06/23 11:01 AM  Result Value Ref Range   HM Mammogram 0-4 Bi-Rad 0-4 Bi-Rad, Self Reported Normal    Assessment and Plan  Mild cognitive impairment Assessment & Plan: Chronic, no worsening per patient and husband Likely multifactorial Continue vitamin D  and vitamin B12 supplementation. Some cognitive impairment may be secondary to mild to moderate chronic microvascular ischemic changes seen on MRI 2023.  Continue statin, non-smoking and heart healthy diet for prevention of progression. Of note patient also with history of alcohol abuse, now in remission.  Could be contributing to memory issues.  On  Aricept  5 mg for delay of progression in case some of her symptoms are more due to Alzheimer's type dementia. No current interest in neurology referral.    COPD  GOLD 0 / AB  Assessment & Plan:  Has quit smoking. Chronic, stable control using albuterol  as needed.  No clear indication for controller.   Aortic atherosclerosis (HCC) -     Ambulatory referral to Cardiology; Future -     Lipid panel; Future  Coronary artery disease involving native coronary artery of native heart without angina pectoris Assessment & Plan: Noted on lung cancer screening, evidence of LAD disease. Will recheck lipid panel.  Currently on atorvastatin  40  mg daily without side effects, may need tighter LDL control. Will refer to cardiology for consideration of stress testing. Start baby aspirin 81 mg daily.  Orders: -     Ambulatory referral to Cardiology; Future -     Lipid panel; Future    Return for lab only, fasting.   Greig Ring, MD

## 2023-10-03 NOTE — Assessment & Plan Note (Signed)
 Noted on lung cancer screening, evidence of LAD disease. Will recheck lipid panel.  Currently on atorvastatin  40 mg daily without side effects, may need tighter LDL control. Will refer to cardiology for consideration of stress testing. Start baby aspirin 81 mg daily.

## 2023-10-03 NOTE — Assessment & Plan Note (Addendum)
 Chronic, no worsening per patient and husband Likely multifactorial Continue vitamin D  and vitamin B12 supplementation. Some cognitive impairment may be secondary to mild to moderate chronic microvascular ischemic changes seen on MRI 2023.  Continue statin, non-smoking and heart healthy diet for prevention of progression. Of note patient also with history of alcohol abuse, now in remission.  Could be contributing to memory issues.  On  Aricept  5 mg for delay of progression in case some of her symptoms are more due to Alzheimer's type dementia. No current interest in neurology referral.

## 2023-12-07 ENCOUNTER — Ambulatory Visit: Payer: Medicare Other | Attending: Cardiology | Admitting: Cardiology

## 2023-12-07 ENCOUNTER — Encounter: Payer: Self-pay | Admitting: Cardiology

## 2023-12-07 VITALS — BP 112/58 | HR 99 | Resp 16 | Ht 61.0 in | Wt 149.0 lb

## 2023-12-07 DIAGNOSIS — E782 Mixed hyperlipidemia: Secondary | ICD-10-CM

## 2023-12-07 DIAGNOSIS — I2510 Atherosclerotic heart disease of native coronary artery without angina pectoris: Secondary | ICD-10-CM

## 2023-12-07 NOTE — Progress Notes (Signed)
 Cardiology Office Note:  .   Date:  12/07/2023  ID:  Melanie Schneider, DOB Apr 19, 1946, MRN [id number] PCP: Melanie Seltzer, MD   [facility] Providers Cardiologist:  Melanie Mainland, MD PCP: Melanie Seltzer, MD  Chief Complaint  Patient presents with   Coronary Artery Disease   New Patient (Initial Visit)   Discussed the use of AI scribe software for clinical note transcription with the patient, who gave verbal consent to proceed.     History of Present Illness: .    Melanie Schneider is a 78 y.o. female with dementia, coronary and aortic atherosclerosis  Patient is here today with her husband.  She reports forgetfulness, including difficulty remembering where she is going and where she has placed items. Despite these issues, she is able to recall current location, date, and personal details. She denies forgetting names or faces of people around her. She is able to get around at home independently, but reports difficulty due to forgetfulness. She is currently on medication for memory issues, prescribed by her primary care doctor.  The patient also reports anosmia, which started after her COVID-19 infection. She is not currently seeing any specialist for this issue.  In addition to these symptoms, the patient was referred for evaluation due to plaque build-up in her heart arteries, identified during a CT scan for lung cancer screening. She denies any chest pain or shortness of breath, and has been on atorvastatin for cholesterol management for a year. She reports quitting smoking a long time ago.  Vitals:   12/07/23 1111  BP: (!) 112/58  Pulse: 99  Resp: 16  SpO2: 98%     ROS:  Review of Systems  Cardiovascular:  Negative for chest pain, dyspnea on exertion, leg swelling, palpitations and syncope.     Studies Reviewed: Marland Kitchen        EKG 12/07/2023: Normal sinus rhythm Left axis deviation Nonspecific T wave abnormality When compared with ECG of 10-Jun-2013  09:14, Fusion complexes are no longer Present T wave inversion less evident in Anterior leads QT has shortened     Independently interpreted 01/2023: Chol 128, TG 55, HDL 43, LDL 73 HbA1C 5.5% Cr 0.62  CT chest 08/2023: 1. Lung-RADS 2S, benign appearance or behavior. Continue annual screening with low-dose chest CT without contrast in 12 months. 2. The "S" modifier above refers to potentially clinically significant non lung cancer related findings. Specifically, there is aortic atherosclerosis, in addition to left anterior descending coronary artery disease. Please note that although the presence of coronary artery calcium documents the presence of coronary artery disease, the severity of this disease and any potential stenosis cannot be assessed on this non-gated CT examination. Assessment for potential risk factor modification, dietary therapy or pharmacologic therapy may be warranted, if clinically indicated. 3. Mild diffuse bronchial wall thickening with very mild centrilobular and paraseptal emphysema; imaging findings suggestive of underlying COPD.   Aortic Atherosclerosis (ICD10-I70.0) and Emphysema (ICD10-J43.9).     Physical Exam:   Physical Exam Vitals and nursing note reviewed.  Constitutional:      General: She is not in acute distress. Neck:     Vascular: No JVD.  Cardiovascular:     Rate and Rhythm: Normal rate and regular rhythm.     Heart sounds: Normal heart sounds. No murmur heard. Pulmonary:     Effort: Pulmonary effort is normal.     Breath sounds: Normal breath sounds. No wheezing or rales.  Musculoskeletal:     Right lower  leg: No edema.     Left lower leg: No edema.      VISIT DIAGNOSES:   ICD-10-CM   1. Coronary artery disease involving native coronary artery of native heart without angina pectoris  I25.10 EKG 12-Lead    2. Mixed hyperlipidemia  E78.2        ASSESSMENT AND PLAN: .    Melanie Schneider is a 78 y.o. female with  dementia, coronary and aortic atherosclerosis  Coronary and aortic atherosclerosis: Incidental finding on CT chest.  No symptoms of angina.  Dementia seems to be taking center stage with her medical issues at this time.  Lipids are well-controlled on Lipitor 40 mg daily.  Continue the same.  I do not think she needs any additional workup from cardiac standpoint at this time.  Unless she has any anginal symptoms, I will see her as needed.     Signed, Melanie Negus, MD

## 2023-12-07 NOTE — Patient Instructions (Signed)
Medication Instructions:   Your physician recommends that you continue on your current medications as directed. Please refer to the Current Medication list given to you today.  *If you need a refill on your cardiac medications before your next appointment, please call your pharmacy*    Follow-Up:  AS NEEDED WITH DR. PATWARDHAN

## 2024-01-03 ENCOUNTER — Ambulatory Visit: Payer: Medicare Other | Admitting: Podiatry

## 2024-01-03 ENCOUNTER — Encounter: Payer: Self-pay | Admitting: Podiatry

## 2024-01-03 DIAGNOSIS — M79675 Pain in left toe(s): Secondary | ICD-10-CM | POA: Diagnosis not present

## 2024-01-03 DIAGNOSIS — M79674 Pain in right toe(s): Secondary | ICD-10-CM | POA: Diagnosis not present

## 2024-01-03 DIAGNOSIS — B351 Tinea unguium: Secondary | ICD-10-CM | POA: Diagnosis not present

## 2024-01-07 NOTE — Progress Notes (Signed)
  Subjective:  Patient ID: Melanie Schneider, female    DOB: Sep 04, 1946,  MRN: [id number]  78 y.o. female presents painful, elongated thickened toenails x 10 which are symptomatic when wearing enclosed shoe gear. This interferes with his/her daily activities. Chief Complaint  Patient presents with   Nail Problem    "Trim my toenails."   New problem(s): None   PCP is Judithann Novas, MD , and last visit was October 03, 2023.  Allergies  Allergen Reactions   Iodine Other (See Comments)    CHEMICAL BURN ON SKIN   Other Hives    MEAT TENDERIZER   Povidone-Iodine Other (See Comments)    CHEMICAL BURN ON SKIN   Wound Dressing Adhesive Other (See Comments)   Adhesive [Tape] Other (See Comments)    SKIN IRRITATION   Latex Other (See Comments)    SKIN IRRITATION    Review of Systems: Negative except as noted in the HPI.   Objective:  Melanie Schneider is a pleasant 78 y.o. female WD, WN in NAD. AAO x 3.  Vascular Examination: Vascular status intact b/l with palpable pedal pulses. CFT immediate b/l. Pedal hair present. No edema. No pain with calf compression b/l. Skin temperature gradient WNL b/l. No varicosities noted. No cyanosis or clubbing noted.  Neurological Examination: Sensation grossly intact b/l with 10 gram monofilament. Vibratory sensation intact b/l.  Dermatological Examination: Pedal skin with normal turgor, texture and tone b/l. No open wounds nor interdigital macerations noted. Toenails 1-5 b/l thick, discolored, elongated with subungual debris and pain on dorsal palpation. No hyperkeratotic lesions noted b/l.   Musculoskeletal Examination: Muscle strength 5/5 to b/l LE.  No pain, crepitus noted b/l. No gross pedal deformities. Patient ambulates independently without assistive aids.   Radiographs: None  Last A1c:      Latest Ref Rng & Units 02/22/2023    8:53 AM  Hemoglobin A1C  Hemoglobin-A1c 4.6 - 6.5 % 5.5      Assessment:   1. Pain due to  onychomycosis of toenails of both feet    Plan:  Patient was evaluated and treated. All patient's and/or POA's questions/concerns addressed on today's visit. Toenails 1-5 debrided in length and girth without incident. Continue soft, supportive shoe gear daily. Report any pedal injuries to medical professional. Call office if there are any questions/concerns. -Patient/POA to call should there be question/concern in the interim.  Return in about 3 months (around 04/03/2024).  Luella Sager, DPM      [facility] LOCATION: [address], [place] [address]                   Office [phone]   [facility] LOCATION: [address] [place], [place] [address] Office [phone]

## 2024-01-14 ENCOUNTER — Ambulatory Visit (INDEPENDENT_AMBULATORY_CARE_PROVIDER_SITE_OTHER): Payer: Medicare HMO

## 2024-01-14 VITALS — BP 112/58 | Ht 61.0 in | Wt 146.0 lb

## 2024-01-14 DIAGNOSIS — Z0000 Encounter for general adult medical examination without abnormal findings: Secondary | ICD-10-CM | POA: Diagnosis not present
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
## 2024-01-14 NOTE — Patient Instructions (Signed)
 Ms. Sauseda , Thank you for taking time to come for your Medicare Wellness Visit. I appreciate your ongoing commitment to your health goals. Please review the following plan we discussed and let me know if I can assist you in the future.   Referrals/Orders/Follow-Ups/Clinician Recommendations: follow up in 1 year for next AWV  This is a list of the screening recommended for you and due dates:  Health Maintenance  Topic Date Due   DEXA scan (bone density measurement)  06/25/2023   COVID-19 Vaccine (10 - 2024-25 season) 01/22/2024   Flu Shot  04/25/2024   Mammogram  08/05/2024   Screening for Lung Cancer  09/06/2024   Medicare Annual Wellness Visit  01/13/2025   DTaP/Tdap/Td vaccine (4 - Td or Tdap) 01/17/2033   Pneumonia Vaccine  Completed   Hepatitis C Screening  Completed   Zoster (Shingles) Vaccine  Completed   HPV Vaccine  Aged Out   Meningitis B Vaccine  Aged Out   Colon Cancer Screening  Discontinued    Advanced directives: (Declined) Advance directive discussed with you today. Even though you declined this today, please call our office should you change your mind, and we can give you the proper paperwork for you to fill out.  Next Medicare Annual Wellness Visit scheduled for next year: Yes

## 2024-01-23 ENCOUNTER — Other Ambulatory Visit (INDEPENDENT_AMBULATORY_CARE_PROVIDER_SITE_OTHER): Payer: Medicare HMO

## 2024-01-23 DIAGNOSIS — I700 Atherosclerosis of aorta: Secondary | ICD-10-CM | POA: Diagnosis not present

## 2024-01-23 DIAGNOSIS — I2510 Atherosclerotic heart disease of native coronary artery without angina pectoris: Secondary | ICD-10-CM

## 2024-01-23 LAB — LIPID PANEL
Cholesterol: 118 mg/dL (ref 0–200)
HDL: 49.2 mg/dL (ref >39.00)
LDL Cholesterol: 57 mg/dL (ref 0–99)
NonHDL: 68.5
Total CHOL/HDL Ratio: 2
Triglycerides: 60 mg/dL (ref 0.0–149.0)
VLDL: 12 mg/dL (ref 0.0–40.0)

## 2024-01-23 NOTE — Progress Notes (Signed)
 No critical labs need to be addressed urgently. We will discuss labs in detail at upcoming office visit.

## 2024-01-25 ENCOUNTER — Ambulatory Visit (INDEPENDENT_AMBULATORY_CARE_PROVIDER_SITE_OTHER): Payer: Medicare HMO | Admitting: Family Medicine

## 2024-01-25 ENCOUNTER — Encounter: Payer: Self-pay | Admitting: Family Medicine

## 2024-01-25 VITALS — BP 100/60 | HR 65 | Temp 97.7°F | Ht 61.0 in | Wt 145.4 lb

## 2024-01-25 DIAGNOSIS — R7309 Other abnormal glucose: Secondary | ICD-10-CM | POA: Diagnosis not present

## 2024-01-25 DIAGNOSIS — F1011 Alcohol abuse, in remission: Secondary | ICD-10-CM

## 2024-01-25 DIAGNOSIS — E348 Other specified endocrine disorders: Secondary | ICD-10-CM

## 2024-01-25 DIAGNOSIS — E538 Deficiency of other specified B group vitamins: Secondary | ICD-10-CM | POA: Diagnosis not present

## 2024-01-25 DIAGNOSIS — I700 Atherosclerosis of aorta: Secondary | ICD-10-CM

## 2024-01-25 DIAGNOSIS — Z0000 Encounter for general adult medical examination without abnormal findings: Secondary | ICD-10-CM | POA: Diagnosis not present

## 2024-01-25 DIAGNOSIS — Z853 Personal history of malignant neoplasm of breast: Secondary | ICD-10-CM

## 2024-01-25 DIAGNOSIS — I5032 Chronic diastolic (congestive) heart failure: Secondary | ICD-10-CM

## 2024-01-25 DIAGNOSIS — E7800 Pure hypercholesterolemia, unspecified: Secondary | ICD-10-CM | POA: Diagnosis not present

## 2024-01-25 DIAGNOSIS — F03A0 Unspecified dementia, mild, without behavioral disturbance, psychotic disturbance, mood disturbance, and anxiety: Secondary | ICD-10-CM

## 2024-01-25 DIAGNOSIS — E559 Vitamin D deficiency, unspecified: Secondary | ICD-10-CM | POA: Diagnosis not present

## 2024-01-25 DIAGNOSIS — I10 Essential (primary) hypertension: Secondary | ICD-10-CM

## 2024-01-25 DIAGNOSIS — J449 Chronic obstructive pulmonary disease, unspecified: Secondary | ICD-10-CM

## 2024-01-25 LAB — COMPREHENSIVE METABOLIC PANEL WITH GFR
ALT: 12 U/L (ref 0–35)
AST: 15 U/L (ref 0–37)
Albumin: 4.2 g/dL (ref 3.5–5.2)
Alkaline Phosphatase: 59 U/L (ref 39–117)
BUN: 21 mg/dL (ref 6–23)
CO2: 30 meq/L (ref 19–32)
Calcium: 8.8 mg/dL (ref 8.4–10.5)
Chloride: 106 meq/L (ref 96–112)
Creatinine, Ser: 0.67 mg/dL (ref 0.40–1.20)
GFR: 84.14 mL/min (ref >60.00)
Glucose, Bld: 107 mg/dL — ABNORMAL HIGH (ref 70–99)
Potassium: 4.2 meq/L (ref 3.5–5.1)
Sodium: 141 meq/L (ref 135–145)
Total Bilirubin: 0.5 mg/dL (ref 0.2–1.2)
Total Protein: 6.9 g/dL (ref 6.0–8.3)

## 2024-01-25 LAB — HEMOGLOBIN A1C: Hgb A1c MFr Bld: 5.6 % (ref 4.6–6.5)

## 2024-01-25 LAB — VITAMIN B12: Vitamin B-12: 213 pg/mL (ref 211–911)

## 2024-01-25 LAB — VITAMIN D 25 HYDROXY (VIT D DEFICIENCY, FRACTURES): VITD: 44.05 ng/mL (ref 30.00–100.00)

## 2024-01-25 MED ORDER — OLOPATADINE HCL 0.1 % OP SOLN: 1.0000 [drp] | Freq: Two times a day (BID) | OPHTHALMIC | 12 refills | Status: AC

## 2024-01-25 MED ORDER — DONEPEZIL HCL 10 MG PO TBDP: 10.0000 mg | ORAL_TABLET | Freq: Every day | ORAL | 3 refills | Status: AC

## 2024-01-25 NOTE — Addendum Note (Signed)
 Addended by: Wyn Heater on: 01/25/2024 11:57 AM   Modules accepted: Orders

## 2024-01-25 NOTE — Patient Instructions (Addendum)
 Please stop at the lab to have labs drawn. Increase donezepil to 10 mg daily for memory.  Start OTC zyrtec at bedtrime.  Start prescription eye drops for allergies.  Try to eat a veggie once daily.  Increase water intake.  Take Purelax daily.  Start ENsure or Boost daily.  Please call the location of your choice from the menu below to schedule your Mammogram and/or Bone Density appointment in NOV 2025  [facility]   [facility]                      Phone:  [phone] [address]                               [place], [place] [address]                                                             Services: Traditional and 3D Mammogram, Bone Density   [facility]                 Phone: [phone] [address]                                                       [place], [place] [address]    Service: Bone Density ONLY   *this site does NOT perform mammograms  [facility]amm[facility]                        Phone:  [phone] [address]                                  [place], [place] [address]                                            Services:  3D Mammogram and Bone Density

## 2024-01-25 NOTE — Assessment & Plan Note (Addendum)
 Chronic, some worsening per patient and husband MMSE today 30/30.Aaron Aasdecreased animal recall, nml clock drawing Likely multifactorial. Continue vitamin D  and vitamin B12 supplementation. Some cognitive impairment may be secondary to mild to moderate chronic microvascular ischemic changes seen on MRI 2023.  Continue statin, non-smoking and heart healthy diet for prevention of progression. Of note patient also with history of alcohol abuse, now in remission.  Could be contributing to memory issues.  On  Aricept  5 mg for delay of progression in case some of her symptoms are more due to Alzheimer's type dementia.... will try trialof increase in dose to 10 mg daily. No current interest in neurology referral.

## 2024-01-25 NOTE — Progress Notes (Signed)
 Patient ID: Melanie Schneider, female    DOB: Sep 24, 1946, 78 y.o.   MRN: [id number]  This visit was conducted in person.  BP 100/60 (BP Location: Left Arm, Patient Position: Sitting, Cuff Size: Normal)   Pulse 65   Temp 97.7 F (36.5 C) (Temporal)   Ht 5\' 1"  (1.549 m)   Wt 145 lb 6 oz (65.9 kg)   SpO2 97%   BMI 27.47 kg/m    CC:  Chief Complaint  Patient presents with   Annual Exam    Part 2 ([facility] 01/14/2024) Memory Issues Watery Eyes/Runny Nose    Subjective:   HPI: Melanie Schneider is a 78 y.o. female presenting on 01/25/2024 for Annual Exam (Part 2 (MWV 01/14/2024)/Memory Issues/Watery Eyes/Runny Nose)  The patient presents for complete physical and review of chronic health problems. He/She also has the following acute concerns today:    Watery eye and runny nose.. has noted in last few months.   2. Memory issues... see below   The patient saw a LPN or RN for medicare wellness visit. 01/14/2024  Prevention and wellness was reviewed in detail. Note reviewed and important notes copied below.  Has lost a small amount of weight overtime. Wt Readings from Last 3 Encounters:  01/25/24 145 lb 6 oz (65.9 kg)  01/14/24 146 lb (66.2 kg)  12/07/23 149 lb (67.6 kg)    Chronic Bladder irritation, incontinence...   Chronic constipation:  At last OV  2 weeks ago... poor control...was not consistently taking miralax .  Encouraged her to use miralax twice daily. She reports now that she has used it more consistently once daily.. has helped.  Having BM every day.   Memory loss... Gradual progressive worsening over the last year.  Hx of alcohol abuse, no current. Past MRI showing microvascular changes. Lab evaluation showed both low vitamin D  and low B12 MMSE   ON donezepil 5 mg daily Need to re-eval B12 level.  Aortic atherosclerosis: LDL almost at goal less than 70 on atorvastatin  40 mg p.o. daily Lab Results  Component Value Date   CHOL 118 01/23/2024    HDL 49.20 01/23/2024   LDLCALC 57 01/23/2024   LDLDIRECT 119.9 10/07/2013   TRIG 60.0 01/23/2024   CHOLHDL 2 01/23/2024    Chronic diastolic heart failure: No current volume overload, pulmonary edema  COPD: stable control on no controllers, just using albuterol  as needed  Prediabetes: Lab Results  Component Value Date   HGBA1C 5.5 02/22/2023   breast cancer: Followed by oncology,  completed Femara   Hypertension:  Well controlled on no medication  BP Readings from Last 3 Encounters:  01/25/24 100/60  01/14/24 (!) 112/58  12/07/23 (!) 112/58  Using medication without problems or lightheadedness:  none Chest pain with exertion: none Edema: none Short of breath:none Average home BPs: Other issues:  Relevant past medical, surgical, family and social history reviewed and updated as indicated. Interim medical history since our last visit reviewed. Allergies and medications reviewed and updated. Outpatient Medications Prior to Visit  Medication Sig Dispense Refill   atorvastatin  (LIPITOR) 40 MG tablet Take 1 tablet (40 mg total) by mouth daily. 90 tablet 2   Cholecalciferol (VITAMIN D3) 1.25 MG (50000 UT) CAPS Take 1 capsule (1.25 mg total) by mouth once a week. 12 capsule 1   donepezil  (ARICEPT  ODT) 5 MG disintegrating tablet Take 1 tablet (5 mg total) by mouth at bedtime. 30 tablet 11   MELATONIN ER PO Take 1-2 each by mouth  at bedtime as needed.     Multiple Vitamin (MULTIVITAMIN) tablet Take 1 tablet by mouth daily.     polyethylene glycol powder (GLYCOLAX/MIRALAX) 17 GM/SCOOP powder Take 1 Container by mouth daily as needed.     vitamin B-12 (CYANOCOBALAMIN ) 1000 MCG tablet Take 1,000 mcg by mouth daily.     Facility-Administered Medications Prior to Visit  Medication Dose Route Frequency Provider Last Rate Last Admin   SONAFINE emulsion 1 application  1 application  Topical BID Bettejane Brownie, PA-C   1 application  at 10/02/17 1521     Per HPI unless specifically  indicated in ROS section below Review of Systems  Constitutional:  Negative for fatigue and fever.  HENT:  Negative for congestion.   Eyes:  Negative for pain.  Respiratory:  Negative for cough and shortness of breath.   Cardiovascular:  Negative for chest pain, palpitations and leg swelling.  Gastrointestinal:  Negative for abdominal pain.  Genitourinary:  Negative for dysuria and vaginal bleeding.  Musculoskeletal:  Negative for back pain.  Neurological:  Negative for syncope, light-headedness and headaches.  Psychiatric/Behavioral:  Negative for dysphoric mood.    Objective:  BP 100/60 (BP Location: Left Arm, Patient Position: Sitting, Cuff Size: Normal)   Pulse 65   Temp 97.7 F (36.5 C) (Temporal)   Ht 5\' 1"  (1.549 m)   Wt 145 lb 6 oz (65.9 kg)   SpO2 97%   BMI 27.47 kg/m   Wt Readings from Last 3 Encounters:  01/25/24 145 lb 6 oz (65.9 kg)  01/14/24 146 lb (66.2 kg)  12/07/23 149 lb (67.6 kg)      Physical Exam Vitals and nursing note reviewed.  Constitutional:      General: She is not in acute distress.    Appearance: Normal appearance. She is well-developed. She is not ill-appearing or toxic-appearing.  HENT:     Head: Normocephalic.     Right Ear: Hearing, tympanic membrane, ear canal and external ear normal.     Left Ear: Hearing, tympanic membrane, ear canal and external ear normal.     Nose: Nose normal.  Eyes:     General: Lids are normal. Lids are everted, no foreign bodies appreciated.     Conjunctiva/sclera: Conjunctivae normal.     Pupils: Pupils are equal, round, and reactive to light.  Neck:     Thyroid : No thyroid  mass or thyromegaly.     Vascular: No carotid bruit.     Trachea: Trachea normal.  Cardiovascular:     Rate and Rhythm: Normal rate and regular rhythm.     Heart sounds: Normal heart sounds, S1 normal and S2 normal. No murmur heard.    No gallop.  Pulmonary:     Effort: Pulmonary effort is normal. No respiratory distress.     Breath  sounds: Normal breath sounds. No wheezing, rhonchi or rales.  Abdominal:     General: Bowel sounds are normal. There is no distension or abdominal bruit.     Palpations: Abdomen is soft. There is no fluid wave or mass.     Tenderness: There is no abdominal tenderness. There is no guarding or rebound.     Hernia: No hernia is present.  Musculoskeletal:     Cervical back: Normal range of motion and neck supple.  Lymphadenopathy:     Cervical: No cervical adenopathy.  Skin:    General: Skin is warm and dry.     Findings: No rash.  Neurological:     Mental  Status: She is alert and oriented to person, place, and time.     Cranial Nerves: No cranial nerve deficit.     Sensory: No sensory deficit.     Comments:  MMSE 30/30  Nml clock drawing  Animal recall 6 in 15 sec  Psychiatric:        Mood and Affect: Mood is not anxious or depressed.        Speech: Speech normal.        Behavior: Behavior normal. Behavior is cooperative.        Judgment: Judgment normal.       Results for orders placed or performed in visit on 01/23/24  Lipid Panel   Collection Time: 01/23/24  9:01 AM  Result Value Ref Range   Cholesterol 118 0 - 200 mg/dL   Triglycerides 84.6 0.0 - 149.0 mg/dL   HDL 96.29 >52.84 mg/dL   VLDL 13.2 0.0 - 44.0 mg/dL   LDL Cholesterol 57 0 - 99 mg/dL   Total CHOL/HDL Ratio 2    NonHDL 68.50      COVID 19 screen:  No recent travel or known exposure to COVID19 The patient denies respiratory symptoms of COVID 19 at this time. The importance of social distancing was discussed today.   Assessment and Plan   The patient's preventative maintenance and recommended screening tests for an annual wellness exam were reviewed in full today. Brought up to date unless services declined.  Counselled on the importance of diet, exercise, and its role in overall health and mortality. The patient's FH and SH was reviewed, including their home life, tobacco status, and drug and alcohol  status.   Vaccines: uptodate except shingles. COVID x4, postpone tetanus Pap/DVE: not indicated, s/p TAH, sister with ovarian cancer Mammo: personal hx of  breast cancer followed by ONC 07/2023 Bone Density: 2019, osteopenia.. repeat in 2024.. DUE Colon:  11/2021  1 polyp, no further needed. Smoking Status: former smoker, > 40 years, aged out of lung cancer screening program ETOH/ drug use: No ETOH in years/ no smoking  Hep C:  done   Problem List Items Addressed This Visit     Alcohol abuse, in remission   Aortic atherosclerosis (HCC)   LDL at goal less than 70 on atorvastatin  40 mg daily      Chronic diastolic heart failure (HCC)   Chronic, no evidence of fluid overload.      COPD  GOLD 0 / AB     Has quit smoking. Chronic, stable control using albuterol  as needed.  No clear indication for controller.      History of breast cancer   Not actively treating. Monitor for recurrence.      Mild dementia (HCC)   Chronic, some worsening per patient and husband Likely multifactorial. Continue vitamin D  and vitamin B12 supplementation. Some cognitive impairment may be secondary to mild to moderate chronic microvascular ischemic changes seen on MRI 2023.  Continue statin, non-smoking and heart healthy diet for prevention of progression. Of note patient also with history of alcohol abuse, now in remission.  Could be contributing to memory issues.  On  Aricept  5 mg for delay of progression in case some of her symptoms are more due to Alzheimer's type dementia.... will try trialof increase in dose to 10 mg daily. No current interest in neurology referral.       PREDIABETES    Re-eval today.  Encouraged exercise, weight loss, healthy eating habits.       Relevant  Orders   Hemoglobin A1c   Primary hypertension   Chronic, resolved with weight changed on no medication.      Pure hypercholesterolemia   Chronic, well controlled LDL at goal less than 70 on atorvastatin  40 mg p.o.  daily.      Vitamin B12 deficiency - Primary   Due for  re-eval      Relevant Orders   Vitamin B12     Herby Lolling, MD

## 2024-01-25 NOTE — Assessment & Plan Note (Signed)
Chronic, resolved with weight changed on no medication. 

## 2024-01-25 NOTE — Assessment & Plan Note (Signed)
LDL at goal less than 70 on atorvastatin 40 mg daily 

## 2024-01-25 NOTE — Assessment & Plan Note (Signed)
Chronic, well controlled LDL at goal less than 70 on atorvastatin 40 mg p.o. daily. 

## 2024-01-25 NOTE — Assessment & Plan Note (Signed)
 Has quit smoking. Chronic, stable control using albuterol as needed.  No clear indication for controller.

## 2024-01-25 NOTE — Assessment & Plan Note (Signed)
 Noted incidentally on lung cancer CT chest  2021 Stable 2023 4.1 cm

## 2024-01-25 NOTE — Assessment & Plan Note (Signed)
Chronic, no evidence of fluid overload. °

## 2024-01-25 NOTE — Assessment & Plan Note (Signed)
 Re-eval today.  Encouraged exercise, weight loss, healthy eating habits.

## 2024-01-25 NOTE — Assessment & Plan Note (Addendum)
 Complete femara . Monitor for recurrence.

## 2024-01-25 NOTE — Assessment & Plan Note (Signed)
 Due for re-eval.

## 2024-01-30 ENCOUNTER — Encounter: Payer: Medicare HMO | Admitting: Family Medicine

## 2024-03-25 DIAGNOSIS — H25813 Combined forms of age-related cataract, bilateral: Secondary | ICD-10-CM | POA: Diagnosis not present

## 2024-04-10 ENCOUNTER — Ambulatory Visit: Admitting: Podiatry

## 2024-04-10 DIAGNOSIS — M79675 Pain in left toe(s): Secondary | ICD-10-CM

## 2024-04-10 DIAGNOSIS — M79674 Pain in right toe(s): Secondary | ICD-10-CM

## 2024-04-10 DIAGNOSIS — B351 Tinea unguium: Secondary | ICD-10-CM | POA: Diagnosis not present

## 2024-04-16 ENCOUNTER — Encounter: Payer: Self-pay | Admitting: Podiatry

## 2024-04-16 NOTE — Progress Notes (Signed)
  Subjective:  Patient ID: Melanie Schneider, female    DOB: 1945-10-05,  MRN: [id number]  78 y.o. female presents painful thick toenails that are difficult to trim. Pain interferes with ambulation. Aggravating factors include wearing enclosed shoe gear. Pain is relieved with periodic professional debridement.  Chief Complaint  Patient presents with   Nail Problem    Thick painful toenails, 3 month follow up    Peripheral Neuropathy    New problem(s): None   PCP is Avelina Greig BRAVO, MD , and last visit was Jan 25, 2024.  Allergies  Allergen Reactions   Iodine Other (See Comments)    CHEMICAL BURN ON SKIN   Other Hives    MEAT TENDERIZER   Povidone-Iodine Other (See Comments)    CHEMICAL BURN ON SKIN   Wound Dressing Adhesive Other (See Comments)   Adhesive [Tape] Other (See Comments)    SKIN IRRITATION   Latex Other (See Comments)    SKIN IRRITATION    Review of Systems: Negative except as noted in the HPI.   Objective:  Ronnesha Ashelynn Marks is a pleasant 78 y.o. female WD, WN in NAD. AAO x 3.  Vascular Examination: Vascular status intact b/l with palpable pedal pulses. CFT immediate b/l. Pedal hair present. No edema. No pain with calf compression b/l. Skin temperature gradient WNL b/l. No varicosities noted. No cyanosis or clubbing noted.  Neurological Examination: Sensation grossly intact b/l with 10 gram monofilament. Vibratory sensation intact b/l.  Dermatological Examination: Pedal skin with normal turgor, texture and tone b/l. No open wounds nor interdigital macerations noted. Toenails 1-5 b/l thick, discolored, elongated with subungual debris and pain on dorsal palpation. No hyperkeratotic lesions noted b/l.   Musculoskeletal Examination: Muscle strength 5/5 to b/l LE.  No pain, crepitus noted b/l. No gross pedal deformities. Utilizes cane for ambulation assistance.  Radiographs: None  Last A1c:      Latest Ref Rng & Units 01/25/2024   12:00 PM   Hemoglobin A1C  Hemoglobin-A1c 4.6 - 6.5 % 5.6      Assessment:   1. Pain due to onychomycosis of toenails of both feet    Plan:  Consent given for treatment. Patient examined. All patient's and/or POA's questions/concerns addressed on today's visit. Mycotic toenails 1-5 debrided in length and girth without incident. Continue soft, supportive shoe gear daily. Report any pedal injuries to medical professional. Call office if there are any quesitons/concerns. -Patient/POA to call should there be question/concern in the interim.  Return in about 3 months (around 07/11/2024).  Delon LITTIE Merlin, DPM      [place] LOCATION: [address], [place] [address]                   Office [phone]   [facility] LOCATION: [address] [place], [place] [address] Office [phone]

## 2024-05-01 NOTE — Progress Notes (Signed)
 This encounter was created in error - please disregard.

## 2024-05-30 ENCOUNTER — Other Ambulatory Visit: Payer: Self-pay | Admitting: Family Medicine

## 2024-05-30 DIAGNOSIS — Z23 Encounter for immunization: Secondary | ICD-10-CM

## 2024-05-30 MED ORDER — COVID-19 MRNA VAC-TRIS(PFIZER) 30 MCG/0.3ML IM SUSY: 0.3000 mL | PREFILLED_SYRINGE | Freq: Once | INTRAMUSCULAR | 0 refills | Status: AC

## 2024-06-05 ENCOUNTER — Other Ambulatory Visit: Payer: Self-pay

## 2024-06-05 ENCOUNTER — Telehealth: Payer: Self-pay

## 2024-06-05 MED ORDER — COVID-19 MRNA VAC-TRIS(PFIZER) 30 MCG/0.3ML IM SUSY: 0.3000 mL | PREFILLED_SYRINGE | Freq: Once | INTRAMUSCULAR | 0 refills | Status: AC

## 2024-06-05 MED ORDER — COVID-19 MRNA VAC-TRIS(PFIZER) 30 MCG/0.3ML IM SUSY: 0.3000 mL | PREFILLED_SYRINGE | Freq: Once | INTRAMUSCULAR | 0 refills | Status: DC

## 2024-06-05 NOTE — Telephone Encounter (Unsigned)
 Copied from CRM #[id number]. Topic: Clinical - Medication Question >> Jun 05, 2024  1:54 PM Robinson H wrote: Reason for CRM: Patients husband is asking if office received request for a covid vaccine from the [facility] on patient file and the status.  Glessie [phone]

## 2024-06-05 NOTE — Telephone Encounter (Signed)
 Covid pended and sent to Dr. Avelina.

## 2024-07-14 ENCOUNTER — Ambulatory Visit: Admitting: Podiatry

## 2024-07-14 DIAGNOSIS — B351 Tinea unguium: Secondary | ICD-10-CM

## 2024-07-14 DIAGNOSIS — M79675 Pain in left toe(s): Secondary | ICD-10-CM

## 2024-07-14 DIAGNOSIS — M79674 Pain in right toe(s): Secondary | ICD-10-CM

## 2024-07-20 ENCOUNTER — Encounter: Payer: Self-pay | Admitting: Podiatry

## 2024-07-20 NOTE — Progress Notes (Signed)
  Subjective:  Patient ID: Melanie Schneider, female    DOB: 07-Sep-1946,  MRN: [id number]  Melanie Schneider presents to clinic today for painful mycotic toenails x 10 which interfere with daily activities. Pain is relieved with periodic professional debridement.  Chief Complaint  Patient presents with   Toe Pain    RFC. Dr. Avelina is her PCP. Last visit was in May. Denies being diabetic   New problem(s): None.   PCP is Avelina Greig BRAVO, MD.  Allergies  Allergen Reactions   Iodine Other (See Comments)    CHEMICAL BURN ON SKIN   Other Hives    MEAT TENDERIZER   Povidone-Iodine Other (See Comments)    CHEMICAL BURN ON SKIN   Wound Dressing Adhesive Other (See Comments)   Adhesive [Tape] Other (See Comments)    SKIN IRRITATION   Latex Other (See Comments)    SKIN IRRITATION    Review of Systems: Negative except as noted in the HPI.  Objective: No changes noted in today's physical examination. There were no vitals filed for this visit. Melanie Schneider is a pleasant 78 y.o. female in NAD. AAO x 3.  Neurovascular status intact b/l and symmetrically.  Dermatological Examination: Pedal skin with normal turgor, texture and tone b/l. No open wounds nor interdigital macerations noted. Toenails 1-5 b/l thick, discolored, elongated with subungual debris and pain on dorsal palpation. No hyperkeratotic lesions noted b/l.   Musculoskeletal Examination: Muscle strength 5/5 to b/l LE.  No pain, crepitus noted b/l. No gross pedal deformities. Utilizes cane for ambulation assistance.  Radiographs: None  Assessment/Plan: 1. Pain due to onychomycosis of toenails of both feet   Patient was evaluated and treated. All patient's and/or POA's questions/concerns addressed on today's visit. Toenails 1-5 b/l debrided in length and girth without incident. Continue soft, supportive shoe gear daily. Report any pedal injuries to medical professional. Call office if there are any  questions/concerns. -Patient/POA to call should there be question/concern in the interim.   Return in about 3 months (around 10/14/2024).  Melanie Schneider, DPM      [place] LOCATION: [address] [place] [address]                   Office [phone]   [facility] LOCATION: [address] [place], [place] [address] Office [phone]

## 2024-08-11 DIAGNOSIS — Z1231 Encounter for screening mammogram for malignant neoplasm of breast: Secondary | ICD-10-CM | POA: Diagnosis not present

## 2024-08-11 LAB — HM MAMMOGRAPHY

## 2024-08-12 ENCOUNTER — Ambulatory Visit: Payer: Self-pay | Admitting: Family Medicine

## 2024-08-12 ENCOUNTER — Encounter: Payer: Self-pay | Admitting: Family Medicine

## 2024-10-30 ENCOUNTER — Ambulatory Visit: Admitting: Podiatry

## 2024-10-30 NOTE — Progress Notes (Signed)
 1. Failure to attend appointment with reason given    Appointment canceled by patient due to weather.

## 2024-12-22 ENCOUNTER — Ambulatory Visit: Admitting: Podiatry

## 2025-01-14 ENCOUNTER — Ambulatory Visit
